# Patient Record
Sex: Female | Born: 1995 | Race: Black or African American | Hispanic: No | Marital: Married | Smoking: Never smoker
Health system: Southern US, Community
[De-identification: ages and names within clinical notes are randomized; demographics above are authoritative.]

## PROBLEM LIST (undated history)

## (undated) ENCOUNTER — Inpatient Hospital Stay (HOSPITAL_COMMUNITY): Payer: Self-pay

## (undated) DIAGNOSIS — I471 Supraventricular tachycardia, unspecified: Secondary | ICD-10-CM

## (undated) DIAGNOSIS — F419 Anxiety disorder, unspecified: Secondary | ICD-10-CM

## (undated) DIAGNOSIS — F329 Major depressive disorder, single episode, unspecified: Secondary | ICD-10-CM

## (undated) DIAGNOSIS — G43909 Migraine, unspecified, not intractable, without status migrainosus: Secondary | ICD-10-CM

## (undated) DIAGNOSIS — F32A Depression, unspecified: Secondary | ICD-10-CM

## (undated) HISTORY — PX: NO PAST SURGERIES: SHX2092

---
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
## 2017-04-01 DIAGNOSIS — R87629 Unspecified abnormal cytological findings in specimens from vagina: Secondary | ICD-10-CM

## 2017-04-01 HISTORY — DX: Unspecified abnormal cytological findings in specimens from vagina: R87.629

## 2017-11-23 ENCOUNTER — Emergency Department (HOSPITAL_COMMUNITY)
Admission: EM | Admit: 2017-11-23 | Discharge: 2017-11-23 | Disposition: A | Discharge status: Home / Self Care | Payer: 59 | Attending: Emergency Medicine | Admitting: Emergency Medicine

## 2017-11-23 ENCOUNTER — Encounter (HOSPITAL_COMMUNITY): Payer: Self-pay

## 2017-11-23 ENCOUNTER — Other Ambulatory Visit: Payer: Self-pay

## 2017-11-23 ENCOUNTER — Emergency Department (HOSPITAL_COMMUNITY): Payer: 59

## 2017-11-23 DIAGNOSIS — G43109 Migraine with aura, not intractable, without status migrainosus: Secondary | ICD-10-CM

## 2017-11-23 DIAGNOSIS — R110 Nausea: Secondary | ICD-10-CM | POA: Insufficient documentation

## 2017-11-23 DIAGNOSIS — R0789 Other chest pain: Secondary | ICD-10-CM | POA: Insufficient documentation

## 2017-11-23 DIAGNOSIS — G43809 Other migraine, not intractable, without status migrainosus: Secondary | ICD-10-CM | POA: Diagnosis not present

## 2017-11-23 DIAGNOSIS — R202 Paresthesia of skin: Secondary | ICD-10-CM

## 2017-11-23 HISTORY — DX: Migraine, unspecified, not intractable, without status migrainosus: G43.909

## 2017-11-23 LAB — COMPREHENSIVE METABOLIC PANEL
ALBUMIN: 3.8 g/dL (ref 3.5–5.0)
ALT: 11 U/L (ref 0–44)
ANION GAP: 10 (ref 5–15)
AST: 14 U/L — AB (ref 15–41)
Alkaline Phosphatase: 42 U/L (ref 38–126)
BUN: 10 mg/dL (ref 6–20)
CO2: 22 mmol/L (ref 22–32)
Calcium: 9.3 mg/dL (ref 8.9–10.3)
Chloride: 106 mmol/L (ref 98–111)
Creatinine, Ser: 0.82 mg/dL (ref 0.44–1.00)
GFR calc Af Amer: >60 mL/min (ref >60)
GFR calc non Af Amer: >60 mL/min (ref >60)
Glucose, Bld: 99 mg/dL (ref 70–99)
POTASSIUM: 3.8 mmol/L (ref 3.5–5.1)
SODIUM: 138 mmol/L (ref 135–145)
TOTAL PROTEIN: 6.8 g/dL (ref 6.5–8.1)
Total Bilirubin: 0.4 mg/dL (ref 0.3–1.2)

## 2017-11-23 LAB — I-STAT CHEM 8, ED
BUN: 12 mg/dL (ref 6–20)
CALCIUM ION: 1.16 mmol/L (ref 1.15–1.40)
CHLORIDE: 106 mmol/L (ref 98–111)
Creatinine, Ser: 0.7 mg/dL (ref 0.44–1.00)
GLUCOSE: 101 mg/dL — AB (ref 70–99)
HCT: 38 % (ref 36.0–46.0)
Hemoglobin: 12.9 g/dL (ref 12.0–15.0)
POTASSIUM: 3.8 mmol/L (ref 3.5–5.1)
Sodium: 138 mmol/L (ref 135–145)
TCO2: 22 mmol/L (ref 22–32)

## 2017-11-23 LAB — I-STAT BETA HCG BLOOD, ED (MC, WL, AP ONLY)

## 2017-11-23 LAB — RAPID URINE DRUG SCREEN, HOSP PERFORMED
AMPHETAMINES: NOT DETECTED
BARBITURATES: NOT DETECTED
BENZODIAZEPINES: NOT DETECTED
COCAINE: NOT DETECTED
OPIATES: NOT DETECTED
Tetrahydrocannabinol: NOT DETECTED

## 2017-11-23 LAB — DIFFERENTIAL
ABS IMMATURE GRANULOCYTES: 0 10*3/uL (ref 0.0–0.1)
BASOS ABS: 0 10*3/uL (ref 0.0–0.1)
Basophils Relative: 0 %
EOS ABS: 0.3 10*3/uL (ref 0.0–0.7)
EOS PCT: 3 %
IMMATURE GRANULOCYTES: 0 %
LYMPHS ABS: 2.4 10*3/uL (ref 0.7–4.0)
LYMPHS PCT: 24 %
Monocytes Absolute: 0.9 10*3/uL (ref 0.1–1.0)
Monocytes Relative: 9 %
Neutro Abs: 6.4 10*3/uL (ref 1.7–7.7)
Neutrophils Relative %: 64 %

## 2017-11-23 LAB — URINALYSIS, ROUTINE W REFLEX MICROSCOPIC
BILIRUBIN URINE: NEGATIVE
Glucose, UA: NEGATIVE mg/dL
Ketones, ur: NEGATIVE mg/dL
LEUKOCYTES UA: NEGATIVE
Nitrite: NEGATIVE
PH: 7 (ref 5.0–8.0)
Protein, ur: NEGATIVE mg/dL
SPECIFIC GRAVITY, URINE: 1.009 (ref 1.005–1.030)

## 2017-11-23 LAB — ETHANOL: Alcohol, Ethyl (B): <10 mg/dL (ref <10)

## 2017-11-23 LAB — I-STAT TROPONIN, ED: Troponin i, poc: 0.02 ng/mL (ref 0.00–0.08)

## 2017-11-23 LAB — CBC
HEMATOCRIT: 39.9 % (ref 36.0–46.0)
Hemoglobin: 13 g/dL (ref 12.0–15.0)
MCH: 30.4 pg (ref 26.0–34.0)
MCHC: 32.6 g/dL (ref 30.0–36.0)
MCV: 93.4 fL (ref 78.0–100.0)
PLATELETS: 290 10*3/uL (ref 150–400)
RBC: 4.27 MIL/uL (ref 3.87–5.11)
RDW: 12.3 % (ref 11.5–15.5)
WBC: 10.1 10*3/uL (ref 4.0–10.5)

## 2017-11-23 LAB — PROTIME-INR
INR: 0.89
Prothrombin Time: 12 seconds (ref 11.4–15.2)

## 2017-11-23 LAB — APTT: APTT: 25 s (ref 24–36)

## 2017-11-23 MED ORDER — METOCLOPRAMIDE HCL 5 MG/ML IJ SOLN
10.0000 mg | Freq: Once | INTRAMUSCULAR | Status: AC
  Administered 2017-11-23: 10 mg via INTRAVENOUS
  Filled 2017-11-23: qty 2

## 2017-11-23 MED ORDER — KETOROLAC TROMETHAMINE 30 MG/ML IJ SOLN
30.0000 mg | Freq: Once | INTRAMUSCULAR | Status: AC
  Administered 2017-11-23: 30 mg via INTRAVENOUS
  Filled 2017-11-23: qty 1

## 2017-11-23 MED ORDER — DIPHENHYDRAMINE HCL 50 MG/ML IJ SOLN
25.0000 mg | Freq: Once | INTRAMUSCULAR | Status: AC
  Administered 2017-11-23: 25 mg via INTRAVENOUS
  Filled 2017-11-23: qty 1

## 2017-11-23 NOTE — ED Notes (Signed)
Nurse starting IV and drawing labs. 

## 2017-11-23 NOTE — ED Triage Notes (Signed)
Patient c/o numbness on the right side that began about 01:00a.m. Also c/o HA

## 2017-11-23 NOTE — Discharge Instructions (Addendum)
Your MRI is normal.  There is no evidence of stroke.  Follow-up with a neurologist for further evaluation of your headaches.  Return to the ED if you develop new weakness, numbness, tingling or any other concerns.

## 2017-11-23 NOTE — ED Provider Notes (Signed)
[facility] EMERGENCY DEPARTMENT Provider Note   CSN: [id number] Arrival date & time: 11/23/17  0347     History   Chief Complaint Chief Complaint  Patient presents with  . Numbness    right side    HPI Lindsey Stevenson is a 22 y.o. female.  Patient presents with numbness and tingling to the right side of her body that onset around 1 AM.  She was awake at this time lying in bed.  She describes pins-and-needles sensation involving her right face, arm and leg.  Denies any weakness in her arm or leg.  No difficulty speaking or difficulty swallowing.  Also developed left-sided headache around the same time gradual in onset similar to previous "migraines".  She states she normally takes Excedrin.  Notes associated with photophobia and nausea but no vomiting.  No fever, chills, chest pain or shortness of breath.  She is never had a numbness before with her migraine headaches.  She is never had any brain imaging in our system.  Denies any illicit drug or alcohol use.  The history is provided by the patient.    Past Medical History:  Diagnosis Date  . Migraine     There are no active problems to display for this patient.   History reviewed. No pertinent surgical history.   OB History   None      Home Medications    Prior to Admission medications   Not on File    Family History History reviewed. No pertinent family history.  Social History Social History   Tobacco Use  . Smoking status: Never Smoker  . Smokeless tobacco: Never Used  Substance Use Topics  . Alcohol use: Yes  . Drug use: Never     Allergies   Patient has no known allergies.   Review of Systems Review of Systems  Constitutional: Negative for activity change, appetite change and fever.  HENT: Negative for congestion.   Eyes: Negative for photophobia and pain.  Respiratory: Positive for chest tightness. Negative for shortness of breath and stridor.   Cardiovascular: Negative  for chest pain.  Gastrointestinal: Positive for nausea. Negative for abdominal pain and vomiting.  Genitourinary: Negative for dysuria, frequency, hematuria, vaginal bleeding and vaginal discharge.  Musculoskeletal: Negative for arthralgias and myalgias.  Skin: Negative for rash.  Neurological: Positive for numbness and headaches. Negative for dizziness, seizures, facial asymmetry, speech difficulty and weakness.    all other systems are negative except as noted in the HPI and PMH.    Physical Exam Updated Vital Signs BP 138/85 (BP Location: Right Arm)   Pulse (!) 113   Temp 98.5 F (36.9 C) (Oral)   Resp 16   Wt 68 kg   LMP 11/09/2017 (Exact Date)   SpO2 100%   Physical Exam  Constitutional: She is oriented to person, place, and time. She appears well-developed and well-nourished. No distress.  HENT:  Head: Normocephalic and atraumatic.  Mouth/Throat: Oropharynx is clear and moist. No oropharyngeal exudate.  Eyes: Pupils are equal, round, and reactive to light. Conjunctivae and EOM are normal.  Neck: Normal range of motion. Neck supple.  No meningismus.  Cardiovascular: Normal rate, regular rhythm, normal heart sounds and intact distal pulses.  No murmur heard. Pulmonary/Chest: Effort normal and breath sounds normal. No respiratory distress. She exhibits no tenderness.  Abdominal: Soft. There is no tenderness. There is no rebound and no guarding.  Musculoskeletal: Normal range of motion. She exhibits no edema or tenderness.  Neurological: She  is alert and oriented to person, place, and time. No cranial nerve deficit. She exhibits normal muscle tone. Coordination normal.  CN 2-12 intact, no ataxia on finger to nose, no nystagmus, 5/5 strength throughout, no pronator drift, Romberg negative, normal gait.   Subjective decreased sensation to right face, arm and leg  Skin: Skin is warm. Capillary refill takes less than 2 seconds. No rash noted.  Psychiatric: She has a normal mood  and affect. Her behavior is normal.  Nursing note and vitals reviewed.    ED Treatments / Results  Labs (all labs ordered are listed, but only abnormal results are displayed) Labs Reviewed  COMPREHENSIVE METABOLIC PANEL - Abnormal; Notable for the following components:      Result Value   AST 14 (*)    All other components within normal limits  URINALYSIS, ROUTINE W REFLEX MICROSCOPIC - Abnormal; Notable for the following components:   Color, Urine STRAW (*)    Hgb urine dipstick MODERATE (*)    Bacteria, UA RARE (*)    All other components within normal limits  I-STAT CHEM 8, ED - Abnormal; Notable for the following components:   Glucose, Bld 101 (*)    All other components within normal limits  ETHANOL  PROTIME-INR  APTT  CBC  DIFFERENTIAL  RAPID URINE DRUG SCREEN, HOSP PERFORMED  I-STAT TROPONIN, ED  I-STAT BETA HCG BLOOD, ED ([facility], [facility], [facility] ONLY)    EKG EKG Interpretation  Date/Time:  Sunday November 23 2017 04:42:55 EDT Ventricular Rate:  114 PR Interval:    QRS Duration: 75 QT Interval:  318 QTC Calculation: 438 R Axis:   85 Text Interpretation:  Sinus tachycardia LAE, consider biatrial enlargement No previous ECGs available Confirmed by Ezequiel Essex [phone]) on 11/23/2017 4:50:17 AM Also confirmed by Ezequiel Essex [phone]), editor Abelardo Diesel [phone])  on 11/23/2017 7:30:27 AM   Radiology Mr Brain Wo Contrast  Result Date: 11/23/2017 CLINICAL DATA:  22 y/o F; numbness on the right-sided beginning at 1 a.m. Headache. EXAM: MRI HEAD WITHOUT CONTRAST TECHNIQUE: Multiplanar, multiecho pulse sequences of the brain and surrounding structures were obtained without intravenous contrast. COMPARISON:  None. FINDINGS: Brain: No acute infarction, hemorrhage, hydrocephalus, extra-axial collection or mass lesion. No structural or signal abnormality of the brain identified. Vascular: Normal flow voids. Skull and upper cervical spine: Normal marrow signal. Sinuses/Orbits: Partial  opacification of ethmoid air cells and mucosal thickening within the right sphenoid sinus. No abnormal signal of the mastoid air cells. Orbits are unremarkable. Other: None. IMPRESSION: 1. No acute intracranial abnormality identified. Unremarkable MRI of the brain. 2. Mild paranasal sinus disease. Electronically Signed   By: Kristine Garbe M.D.   On: 11/23/2017 06:11    Procedures Procedures (including critical care time)  Medications Ordered in ED Medications  metoCLOPramide (REGLAN) injection 10 mg (has no administration in time range)  ketorolac (TORADOL) 30 MG/ML injection 30 mg (has no administration in time range)  diphenhydrAMINE (BENADRYL) injection 25 mg (has no administration in time range)     Initial Impression / Assessment and Plan / ED Course  I have reviewed the triage vital signs and the nursing notes.  Pertinent labs & imaging results that were available during my care of the patient were reviewed by me and considered in my medical decision making (see chart for details).    Right-sided numbness associated with headache similar to previous migraines.  Patient has never had the numbness before with her migraines however.  Discussed with Dr. Leonel Ramsay of  neurology who agrees this is unlikely to be stroke and agrees with not calling code stroke.  Does recommend treating with migraine cocktail and obtaining MRI.  Patient given toradol, reglan, benadryl. Headache and numbness have improved.  MRI is reassuring.   Patient feels improved.  MRI shows no evidence of stroke.  She will follow-up with neurology.  Supportive care discussed for complicated migraine.  Neurology referral given.  Return precautions discussed.   Final Clinical Impressions(s) / ED Diagnoses   Final diagnoses:  Complicated migraine  Paresthesia    ED Discharge Orders    None       Malaki Koury, Annie Main, MD 11/23/17 [phone]

## 2017-11-23 NOTE — ED Notes (Addendum)
PT states understanding of care given, follow up care. Pt understand discharge paperwork and has no more questions at this time. PT ambulated from ED to car with a steady gait.

## 2018-12-21 LAB — OB RESULTS CONSOLE ABO/RH: RH Type: POSITIVE

## 2018-12-21 LAB — OB RESULTS CONSOLE RUBELLA ANTIBODY, IGM: Rubella: IMMUNE

## 2018-12-21 LAB — OB RESULTS CONSOLE HIV ANTIBODY (ROUTINE TESTING): HIV: NONREACTIVE

## 2018-12-21 LAB — OB RESULTS CONSOLE RPR: RPR: NONREACTIVE

## 2018-12-21 LAB — OB RESULTS CONSOLE HEPATITIS B SURFACE ANTIGEN: Hepatitis B Surface Ag: NEGATIVE

## 2019-03-07 ENCOUNTER — Inpatient Hospital Stay (HOSPITAL_BASED_OUTPATIENT_CLINIC_OR_DEPARTMENT_OTHER): Payer: BC Managed Care – PPO

## 2019-03-07 ENCOUNTER — Encounter (HOSPITAL_COMMUNITY): Payer: Self-pay

## 2019-03-07 ENCOUNTER — Inpatient Hospital Stay (HOSPITAL_COMMUNITY)
Admission: AD | Admit: 2019-03-07 | Discharge: 2019-03-07 | Disposition: A | Discharge status: Home / Self Care | Payer: BC Managed Care – PPO | Attending: Obstetrics and Gynecology | Admitting: Obstetrics and Gynecology

## 2019-03-07 ENCOUNTER — Other Ambulatory Visit: Payer: Self-pay

## 2019-03-07 DIAGNOSIS — O3412 Maternal care for benign tumor of corpus uteri, second trimester: Secondary | ICD-10-CM | POA: Diagnosis not present

## 2019-03-07 DIAGNOSIS — O4442 Low lying placenta NOS or without hemorrhage, second trimester: Secondary | ICD-10-CM | POA: Diagnosis not present

## 2019-03-07 DIAGNOSIS — R109 Unspecified abdominal pain: Secondary | ICD-10-CM | POA: Diagnosis present

## 2019-03-07 DIAGNOSIS — O4692 Antepartum hemorrhage, unspecified, second trimester: Secondary | ICD-10-CM

## 2019-03-07 DIAGNOSIS — O209 Hemorrhage in early pregnancy, unspecified: Secondary | ICD-10-CM | POA: Diagnosis not present

## 2019-03-07 DIAGNOSIS — Z3A18 18 weeks gestation of pregnancy: Secondary | ICD-10-CM | POA: Diagnosis not present

## 2019-03-07 DIAGNOSIS — D259 Leiomyoma of uterus, unspecified: Secondary | ICD-10-CM

## 2019-03-07 LAB — WET PREP, GENITAL
Clue Cells Wet Prep HPF POC: NONE SEEN
Sperm: NONE SEEN
Trich, Wet Prep: NONE SEEN
Yeast Wet Prep HPF POC: NONE SEEN

## 2019-03-07 LAB — URINALYSIS, ROUTINE W REFLEX MICROSCOPIC
Bilirubin Urine: NEGATIVE
Glucose, UA: NEGATIVE mg/dL
Hgb urine dipstick: NEGATIVE
Ketones, ur: 5 mg/dL — AB
Leukocytes,Ua: NEGATIVE
Nitrite: NEGATIVE
Protein, ur: NEGATIVE mg/dL
Specific Gravity, Urine: 1.008 (ref 1.005–1.030)
pH: 7 (ref 5.0–8.0)

## 2019-03-07 NOTE — MAU Note (Signed)
Pt c/o cramping that started aobut 30-45 mins prior to arrival and some vaginal bleeding that she noticed right before she came in. States there was a small amount of red blood in underwear. Reports she is [redacted] weeks pregnant. LMP: 10/30/18. States her EDD is 08/02/19. Gets care at [facility]

## 2019-03-07 NOTE — Discharge Instructions (Signed)
Placenta Previa Placenta previa is a condition in which the placenta implants in the lower part of the uterus in pregnant women. The placenta either partially or completely covers the opening to the cervix. This is a problem because the baby must pass through the cervix during delivery. There are three types of placenta previa:  Marginal placenta previa. The placenta reaches within an inch (2.5 cm) of the cervical opening but does not cover it.  Partial placenta previa. The placenta covers part of the cervical opening.  Complete placenta previa. The placenta covers the entire cervical opening. If the previa is marginal or partial and it is diagnosed in the first half of pregnancy, the placenta may move into a normal position as the pregnancy progresses and may no longer cover the cervix. It is important to keep all prenatal visits with your health care provider so you can be more closely monitored. What are the causes? The cause of this condition is not known. What increases the risk? This condition is more likely to develop in women who:  Are carrying more than one baby (multiples).  Have an abnormally shaped uterus.  Have scars on the lining of the uterus.  Have had surgeries involving the uterus, such as a cesarean delivery.  Have delivered a baby before.  Have a history of placenta previa.  Have smoked or used cocaine during pregnancy.  Are age 22 or older during pregnancy. What are the signs or symptoms? The main symptom of this condition is sudden, painless vaginal bleeding during the second half of pregnancy. The amount of bleeding can be very light at first, and it usually stops on its own. Heavier bleeding episodes may also happen. Some women with placenta previa may have no bleeding at all. How is this diagnosed?  This condition is diagnosed: ? From an ultrasound. This test uses sound waves to find where the placenta is located before you have any bleeding  episodes. ? During a checkup after vaginal bleeding is noticed.  If you are diagnosed with a partial or complete previa, digital exams with fingers will generally be avoided. Your health care provider will still perform a speculum exam.  If you did not have an ultrasound during your pregnancy, placenta previa may not be diagnosed until bleeding occurs during labor. How is this treated? Treatment for this condition may include:  Decreased activity.  Bed rest at home or in the hospital.  Pelvic rest. Nothing is placed inside the vagina during pelvic rest. This means not having sex and not using tampons or douches.  A blood transfusion to replace blood that you have lost (maternal blood loss).  A cesarean delivery. This may be performed if: ? The bleeding is heavy and cannot be controlled. ? The placenta completely covers the cervix.  Medicines to stop premature labor or to help the baby's lungs to mature. This treatment may be used if you need delivery before your pregnancy is full-term. Your treatment will be decided based on:  How much you are bleeding, or whether the bleeding has stopped.  How far along you are in your pregnancy.  The condition of your baby.  The type of placenta previa that you have.  Follow these instructions at home:  Get plenty of rest and lessen activity as told by your health care provider.  Stay on bed rest for as long as told by your health care provider.  Do not have sex, use tampons, use a douche, or place anything inside of  your vagina if your health care provider recommended pelvic rest.  Take over-the-counter and prescription medicines as told by your health care provider.  Keep all follow-up visits as told by your health care provider. This is important. Get help right away if:  You have vaginal bleeding, even if in small amounts and even if you have no pain.  You have cramping or regular contractions.  You have pain in your abdomen or  your lower back.  You have a feeling of increased pressure in your pelvis.  You have increased watery or bloody mucus from the vagina. This information is not intended to replace advice given to you by your health care provider. Make sure you discuss any questions you have with your health care provider. Document Released: 03/18/2005 Document Revised: 02/28/2017 Document Reviewed: 09/30/2015 Elsevier Patient Education  2020 [employer].

## 2019-03-07 NOTE — MAU Provider Note (Signed)
Chief Complaint: Abdominal Pain and Vaginal Bleeding   First Provider Initiated Contact with Patient 03/07/19 2104     SUBJECTIVE HPI: Lindsey Stevenson is a 23 y.o. G1P0 at [redacted]w[redacted]d who presents to Maternity Admissions reporting vaginal bleeding & abdominal cramping. Symptoms started about 45 minutes prior to arrival to MAU. Reports seeing a few spot of red blood in her underwear. Bleeding has not continued and she did not have to wear a pad. Had constant lower abdominal cramping that resolved prior to coming to MAU.  Denies n/v/d, dysuria, vaginal discharge, vaginal irritation, constipation, hemorrhoids, or recent intercourse. Goes to Dr. Nelda Marseille for prenatal care. Has not had her anatomy ultrasound yet. Is O positive per her EMR app on her phone.     Past Medical History:  Diagnosis Date  . Migraine    OB History  Gravida Para Term Preterm AB Living  1            SAB TAB Ectopic Multiple Live Births               # Outcome Date GA Lbr Len/2nd Weight Sex Delivery Anes PTL Lv  1 Current            Past Surgical History:  Procedure Laterality Date  . NO PAST SURGERIES     Social History   Socioeconomic History  . Marital status: Married    Spouse name: Not on file  . Number of children: Not on file  . Years of education: Not on file  . Highest education level: Not on file  Occupational History  . Not on file  Social Needs  . Financial resource strain: Not on file  . Food insecurity    Worry: Not on file    Inability: Not on file  . Transportation needs    Medical: Not on file    Non-medical: Not on file  Tobacco Use  . Smoking status: Never Smoker  . Smokeless tobacco: Never Used  Substance and Sexual Activity  . Alcohol use: Not Currently  . Drug use: Never  . Sexual activity: Yes  Lifestyle  . Physical activity    Days per week: Not on file    Minutes per session: Not on file  . Stress: Not on file  Relationships  . Social Herbalist on phone: Not on  file    Gets together: Not on file    Attends religious service: Not on file    Active member of club or organization: Not on file    Attends meetings of clubs or organizations: Not on file    Relationship status: Not on file  . Intimate partner violence    Fear of current or ex partner: Not on file    Emotionally abused: Not on file    Physically abused: Not on file    Forced sexual activity: Not on file  Other Topics Concern  . Not on file  Social History Narrative  . Not on file   History reviewed. No pertinent family history. No current facility-administered medications on file prior to encounter.    Current Outpatient Medications on File Prior to Encounter  Medication Sig Dispense Refill  . Prenatal Vit-Fe Fumarate-FA (PRENATAL MULTIVITAMIN) TABS tablet Take 1 tablet by mouth daily at 12 noon.     No Known Allergies  I have reviewed patient's Past Medical Hx, Surgical Hx, Family Hx, Social Hx, medications and allergies.   Review of Systems  Constitutional: Negative.  Gastrointestinal: Positive for abdominal pain (none currently). Negative for constipation, diarrhea, nausea and vomiting.  Genitourinary: Positive for vaginal bleeding. Negative for dysuria and vaginal discharge.    OBJECTIVE Patient Vitals for the past 24 hrs:  BP Temp Temp src Pulse Resp SpO2 Height Weight  03/07/19 2321 117/70 - - 78 18 - - -  03/07/19 2027 130/73 98.8 F (37.1 C) Oral 96 16 100 % 5\' 6"  (1.676 m) 73.4 kg   Constitutional: Well-developed, well-nourished female in no acute distress.  Cardiovascular: normal rate & rhythm, no murmur Respiratory: normal rate and effort. Lung sounds clear throughout GI: Abd soft, non-tender, Pos BS x 4. No guarding or rebound tenderness MS: Extremities nontender, no edema, normal ROM Neurologic: Alert and oriented x 4.  GU:     SPECULUM EXAM: NEFG, moderate amount of thin white discharge. Vaginal walls & cervix erythematous. Cervix visually closed. No  blood. Digital exam deferred.       LAB RESULTS Results for orders placed or performed during the hospital encounter of 03/07/19 (from the past 24 hour(s))  Urinalysis, Routine w reflex microscopic     Status: Abnormal   Collection Time: 03/07/19  8:42 PM  Result Value Ref Range   Color, Urine STRAW (A) YELLOW   APPearance CLEAR CLEAR   Specific Gravity, Urine 1.008 1.005 - 1.030   pH 7.0 5.0 - 8.0   Glucose, UA NEGATIVE NEGATIVE mg/dL   Hgb urine dipstick NEGATIVE NEGATIVE   Bilirubin Urine NEGATIVE NEGATIVE   Ketones, ur 5 (A) NEGATIVE mg/dL   Protein, ur NEGATIVE NEGATIVE mg/dL   Nitrite NEGATIVE NEGATIVE   Leukocytes,Ua NEGATIVE NEGATIVE  Wet prep, genital     Status: Abnormal   Collection Time: 03/07/19  9:14 PM  Result Value Ref Range   Yeast Wet Prep HPF POC NONE SEEN NONE SEEN   Trich, Wet Prep NONE SEEN NONE SEEN   Clue Cells Wet Prep HPF POC NONE SEEN NONE SEEN   WBC, Wet Prep HPF POC MANY (A) NONE SEEN   Sperm NONE SEEN     IMAGING [place] Mfm Ob Transvaginal  Result Date: 03/07/2019 ----------------------------------------------------------------------  OBSTETRICS REPORT                       (Signed Final 03/07/2019 10:43 pm) ---------------------------------------------------------------------- Patient Info  ID #:       [id number]                          D.O.B.:  [birth year] (23 yrs)  Name:       Lindsey Stevenson                 Visit Date: 03/07/2019 09:59 pm ---------------------------------------------------------------------- Performed By  Performed By:     Dorena Dew     Ref. Address:      [address]                    [place], [place]  Attending:        Tama High MD  Secondary Phy.:    MAU Nursing-                                                              MAU/Triage  Referred By:      Jorje Guild          [facility]                    [place] ---------------------------------------------------------------------- Orders   #  Description                          Code         Ordered By   1  [place] MFM OB LIMITED                    76815.01     Jorje Guild   2  [place] MFM OB TRANSVAGINAL               [id number]      Jorje Guild  ----------------------------------------------------------------------   #  Order #                    Accession #                 Episode #   1  [id number]                  [id number]   2  [id number]                  [id number]  ---------------------------------------------------------------------- Indications   Vaginal bleeding in pregnancy, second          O46.92   trimester   [redacted] weeks gestation of pregnancy                Z3A.18   Encounter for cervical length                  Z36.86   Encounter for antenatal screening for          Z36.3   malformations (Evaluate placenta location)   Uterine fibroids affecting pregnancy in        O34.12, D25.9   second trimester, antepartum  ---------------------------------------------------------------------- Fetal Evaluation  Num Of Fetuses:          1  Fetal Heart Rate(bpm):   148  Cardiac Activity:        Observed  Presentation:            Breech  Placenta:                Posterior Low-lying, 1.2cm from int os  P. Cord Insertion:       Visualized  Amniotic Fluid  AFI  FV:      Within normal limits                              Largest Pocket(cm)                              7.51 ---------------------------------------------------------------------- OB History  Gravidity:    1         Term:   0        Prem:   0        SAB:   0  TOP:          0       Ectopic:  0        Living: 0 ---------------------------------------------------------------------- Gestational Age  Clinical EDD:  18w 6d                                        EDD:   08/02/19  Best:          18w 6d     Det. By:  Clinical EDD             EDD:   08/02/19  ---------------------------------------------------------------------- Cervix Uterus Adnexa  Left Ovary  Within normal limits.  Right Ovary  Not visualized. ---------------------------------------------------------------------- Myomas   Site                     L(cm)      W(cm)      D(cm)       Location   Posterior Left           5.9        6.6        6.8   Anterior Left            3.2        3          2    Anterior Lt Fundal       3.4        1.8        3.8  ----------------------------------------------------------------------   Blood Flow                 RI        PI       Comments                                                 Largest  ---------------------------------------------------------------------- Impression  Patient is being evaluated for c/o vaginal bleeding and  abdominal cramping. On speculum examination, closed  cervix was seen.  A limited ultrasound study was performed. Amniotic fluid is  normal and good fetal activity is seen. 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 On transvaginal scan, the cervix  measures 6.2 cm, which may not be an accurate assessment  of cervical length (apparent lengthening by posterior myoma).  However, it is not consistent with cervical incompetence. The  placental edge is 1.2 cm from what appears as the internal os  (low lying). ---------------------------------------------------------------------- Recommendations  Detailed anatomical survey as outpatient. ----------------------------------------------------------------------  Tama High, MD Electronically Signed Final Report   03/07/2019 10:43 pm ----------------------------------------------------------------------  [place] Mfm Ob Limited  Result Date: 03/07/2019 ----------------------------------------------------------------------  OBSTETRICS REPORT                       (Signed Final 03/07/2019 10:43 pm)  ---------------------------------------------------------------------- Patient Info  ID #:       [id number]                          D.O.B.:  1996/03/25 (23 yrs)  Name:       Lindsey Stevenson                 Visit Date: 03/07/2019 09:59 pm ---------------------------------------------------------------------- Performed By  Performed By:     Dorena Dew     Ref. Address:      [address]                    [place], [place]  Attending:        Tama High MD        Secondary Phy.:    MAU Nursing-                                                              MAU/Triage  Referred By:      Jorje Guild          [facility]                    [place] ---------------------------------------------------------------------- Orders   #  Description                          Code         Ordered By   1  [place] MFM OB LIMITED                    76815.01     Jorje Guild   2  [place] MFM OB TRANSVAGINAL               [id number]      Jorje Guild  ----------------------------------------------------------------------   #  Order #                    Accession #                 Episode #   1  [id number]                  [id number]  [id number]   2  [id number]                  [id number]                  [id number]  ---------------------------------------------------------------------- Indications   Vaginal bleeding in pregnancy, second          O46.92   trimester   [redacted] weeks gestation of pregnancy                Z3A.18   Encounter for cervical length                  Z36.86   Encounter for antenatal screening for          Z36.3   malformations (Evaluate placenta location)   Uterine fibroids affecting pregnancy in        O34.12, D25.9   second trimester, antepartum  ---------------------------------------------------------------------- Fetal Evaluation  Num Of Fetuses:          1  Fetal Heart  Rate(bpm):   148  Cardiac Activity:        Observed  Presentation:            Breech  Placenta:                Posterior Low-lying, 1.2cm from int os  P. Cord Insertion:       Visualized  Amniotic Fluid  AFI FV:      Within normal limits                              Largest Pocket(cm)                              7.51 ---------------------------------------------------------------------- OB History  Gravidity:    1         Term:   0        Prem:   0        SAB:   0  TOP:          0       Ectopic:  0        Living: 0 ---------------------------------------------------------------------- Gestational Age  Clinical EDD:  18w 6d                                        EDD:   08/02/19  Best:          18w 6d     Det. By:  Clinical EDD             EDD:   08/02/19 ---------------------------------------------------------------------- Cervix Uterus Adnexa  Left Ovary  Within normal limits.  Right Ovary  Not visualized. ---------------------------------------------------------------------- Myomas   Site                     L(cm)      W(cm)      D(cm)       Location   Posterior Left           5.9        6.6        6.8   Anterior Left            3.2        3  2   Anterior Lt Fundal       3.4        1.8        3.8  ----------------------------------------------------------------------   Blood Flow                 RI        PI       Comments                                                 Largest  ---------------------------------------------------------------------- Impression  Patient is being evaluated for c/o vaginal bleeding and  abdominal cramping. On speculum examination, closed  cervix was seen.  A limited ultrasound study was performed. Amniotic fluid is  normal and good fetal activity is seen. 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 On transvaginal scan, the cervix  measures 6.2 cm, which may not be an accurate assessment  of cervical length (apparent  lengthening by posterior myoma).  However, it is not consistent with cervical incompetence. The  placental edge is 1.2 cm from what appears as the internal os  (low lying). ---------------------------------------------------------------------- Recommendations  Detailed anatomical survey as outpatient. ----------------------------------------------------------------------                  Tama High, MD Electronically Signed Final Report   03/07/2019 10:43 pm ----------------------------------------------------------------------   MAU COURSE Orders Placed This Encounter  Procedures  . Wet prep, genital  . [place] MFM OB LIMITED  . [place] MFM OB Transvaginal  . Urinalysis, Routine w reflex microscopic  . Discharge patient   No orders of the defined types were placed in this encounter.   MDM RH positive  FHT present via doppler  No blood on exam & cervix visually closed  Ultrasound - normal cervical length. Low lying placenta, 1.2 cm from os  Reviewed results with patient & discussed pelvic rest. Has anatomy ultrasound & ob appt in a few weeks.  ASSESSMENT 1. Low-lying placenta in second trimester   2. [redacted] weeks gestation of pregnancy   3. Vaginal bleeding in pregnancy, second trimester     PLAN Discharge home in stable condition. Bleeding precautions GC/CT pending Pelvic rest  Follow-up Information    Janyth Pupa, DO Follow up.   Specialty: Obstetrics and Gynecology Why: keep scheduled appointment or call office as needed Contact information: [place]. [place] [address] [phone]        [facility] Follow up.   Specialty: Obstetrics and Gynecology Why: return for worsening symptoms Contact information: [address] [id number] [place] [phone]         Allergies as of 03/07/2019   No Known Allergies     Medication List    TAKE these medications   prenatal multivitamin  Tabs tablet Take 1 tablet by mouth daily at 12 noon.        Jorje Guild, NP 03/07/2019  11:32 PM

## 2019-03-09 LAB — GC/CHLAMYDIA PROBE AMP (~~LOC~~) NOT AT ARMC
Chlamydia: NEGATIVE
Comment: NEGATIVE
Comment: NORMAL
Neisseria Gonorrhea: NEGATIVE

## 2019-04-02 NOTE — L&D Delivery Note (Signed)
Delivery Note At 6:30 PM a viable female was delivered via Vaginal, Spontaneous (Presentation: Left Occiput Anterior).  APGAR: 9, 9; weight  .   Placenta status: Spontaneous, Intact.  Cord: 3 vessels with the following complications: None.  Cord pH: NA  Anesthesia: Epidural Episiotomy: None Lacerations: 1st degree;Labial Suture Repair: 3.0 vicryl Est. Blood Loss (mL): 300 mL   Mom to postpartum.  Baby to Couplet care / Skin to Skin.  Lindsey Stevenson 08/07/2019, 6:56 PM

## 2019-07-09 LAB — OB RESULTS CONSOLE GBS: GBS: NEGATIVE

## 2019-07-29 ENCOUNTER — Inpatient Hospital Stay (HOSPITAL_COMMUNITY)
Admission: AD | Admit: 2019-07-29 | Discharge: 2019-07-29 | Disposition: A | Discharge status: Home / Self Care | Payer: No Typology Code available for payment source | Attending: Obstetrics & Gynecology | Admitting: Obstetrics & Gynecology

## 2019-07-29 ENCOUNTER — Other Ambulatory Visit: Payer: Self-pay

## 2019-07-29 ENCOUNTER — Encounter (HOSPITAL_COMMUNITY): Payer: Self-pay | Admitting: Obstetrics & Gynecology

## 2019-07-29 DIAGNOSIS — O26893 Other specified pregnancy related conditions, third trimester: Secondary | ICD-10-CM | POA: Insufficient documentation

## 2019-07-29 DIAGNOSIS — R030 Elevated blood-pressure reading, without diagnosis of hypertension: Secondary | ICD-10-CM | POA: Diagnosis present

## 2019-07-29 DIAGNOSIS — R519 Headache, unspecified: Secondary | ICD-10-CM | POA: Diagnosis not present

## 2019-07-29 DIAGNOSIS — Z3689 Encounter for other specified antenatal screening: Secondary | ICD-10-CM

## 2019-07-29 DIAGNOSIS — Z3A39 39 weeks gestation of pregnancy: Secondary | ICD-10-CM | POA: Insufficient documentation

## 2019-07-29 LAB — COMPREHENSIVE METABOLIC PANEL
ALT: 15 U/L (ref 0–44)
AST: 16 U/L (ref 15–41)
Albumin: 2.6 g/dL — ABNORMAL LOW (ref 3.5–5.0)
Alkaline Phosphatase: 89 U/L (ref 38–126)
Anion gap: 9 (ref 5–15)
BUN: 6 mg/dL (ref 6–20)
CO2: 23 mmol/L (ref 22–32)
Calcium: 9.1 mg/dL (ref 8.9–10.3)
Chloride: 107 mmol/L (ref 98–111)
Creatinine, Ser: 0.89 mg/dL (ref 0.44–1.00)
GFR calc Af Amer: >60 mL/min (ref >60)
GFR calc non Af Amer: >60 mL/min (ref >60)
Glucose, Bld: 89 mg/dL (ref 70–99)
Potassium: 4.1 mmol/L (ref 3.5–5.1)
Sodium: 139 mmol/L (ref 135–145)
Total Bilirubin: 0.4 mg/dL (ref 0.3–1.2)
Total Protein: 5.7 g/dL — ABNORMAL LOW (ref 6.5–8.1)

## 2019-07-29 LAB — CBC
HCT: 34.2 % — ABNORMAL LOW (ref 36.0–46.0)
Hemoglobin: 11.6 g/dL — ABNORMAL LOW (ref 12.0–15.0)
MCH: 31.4 pg (ref 26.0–34.0)
MCHC: 33.9 g/dL (ref 30.0–36.0)
MCV: 92.7 fL (ref 80.0–100.0)
Platelets: 221 10*3/uL (ref 150–400)
RBC: 3.69 MIL/uL — ABNORMAL LOW (ref 3.87–5.11)
RDW: 13.2 % (ref 11.5–15.5)
WBC: 10 10*3/uL (ref 4.0–10.5)
nRBC: 0 % (ref 0.0–0.2)

## 2019-07-29 LAB — PROTEIN / CREATININE RATIO, URINE
Creatinine, Urine: 42.19 mg/dL
Total Protein, Urine: <6 mg/dL

## 2019-07-29 NOTE — MAU Provider Note (Signed)
History     CSN: [id number]  Arrival date and time: 07/29/19 1504   First Provider Initiated Contact with Patient 07/29/19 1607      Chief Complaint  Patient presents with  . Headache  . Hypertension   [name] is a 24 y.o. G1P0 at [redacted]w[redacted]d who presents to MAU for preeclampsia evaluation after she went to a pharmacy today to check her blood pressure and had a severe range reading. Patient reports her mother does have a blood pressure cuff at home, but reports her mother's doctor told her not to use it because it was not accurate. Patient reports she got a severe headache at home, took 2 regular strength Tylenol and then went to the pharmacy to check her blood pressure. Patient reports a history of diagnosed migraine headaches with blurry vision, which is what she experienced today. Patient reports headache is 1/10 on arrival to MAU, without blurry vision.  Pt denies HA, blurry vision/seeing spots, N/V, epigastric pain, swelling in face and hands, sudden weight gain. Pt denies chest pain and SOB.  Pt denies constipation, diarrhea, or urinary problems. Pt denies fever, chills, fatigue, sweating or changes in appetite. Pt denies dizziness, light-headedness, weakness.  Pt denies VB, ctx, LOF and reports good FM.   OB History    Gravida  1   Para      Term      Preterm      AB      Living        SAB      TAB      Ectopic      Multiple      Live Births              Past Medical History:  Diagnosis Date  . Migraine     Past Surgical History:  Procedure Laterality Date  . NO PAST SURGERIES      Family History  Problem Relation Age of Onset  . Hypertension Maternal Grandmother   . Cancer Maternal Grandmother   . Hypertension Maternal Grandfather   . Hypertension Paternal Grandmother   . Hypertension Paternal Grandfather     Social History   Tobacco Use  . Smoking status: Never Smoker  . Smokeless tobacco: Never Used  Substance Use Topics   . Alcohol use: Not Currently  . Drug use: Never    Allergies: No Known Allergies  No medications prior to admission.    Review of Systems  Constitutional: Negative for chills, diaphoresis, fatigue and fever.  Eyes: Negative for visual disturbance.  Respiratory: Negative for shortness of breath.   Cardiovascular: Negative for chest pain.  Gastrointestinal: Negative for abdominal pain, constipation, diarrhea, nausea and vomiting.  Genitourinary: Negative for dysuria, flank pain, frequency, pelvic pain, urgency, vaginal bleeding and vaginal discharge.  Neurological: Positive for headaches. Negative for dizziness, weakness and light-headedness.   Physical Exam   Blood pressure 122/71, pulse 87, temperature 98.5 F (36.9 C), temperature source Oral, resp. rate 16, height 5\' 6"  (1.676 m), weight 96.3 kg, SpO2 97 %.  Patient Vitals for the past 24 hrs:  BP Temp Temp src Pulse Resp SpO2 Height Weight  07/29/19 1704 -- -- -- -- 16 -- -- --  07/29/19 1631 122/71 -- -- 87 -- -- -- --  07/29/19 1624 137/80 -- -- 86 -- -- -- --  07/29/19 1600 118/74 -- -- 88 -- 97 % -- --  07/29/19 1537 131/81 -- -- 98 -- -- -- --  07/29/19  1524 131/77 98.5 F (36.9 C) Oral 81 16 100 % 5\' 6"  (1.676 m) 96.3 kg   Physical Exam  Constitutional: She is oriented to person, place, and time. She appears well-developed and well-nourished. No distress.  HENT:  Head: Normocephalic and atraumatic.  Respiratory: Effort normal.  Neurological: She is alert and oriented to person, place, and time.  Skin: She is not diaphoretic.  Psychiatric: She has a normal mood and affect. Her behavior is normal. Judgment and thought content normal.   Results for orders placed or performed during the hospital encounter of 07/29/19 (from the past 24 hour(s))  Protein / creatinine ratio, urine     Status: None   Collection Time: 07/29/19  4:32 PM  Result Value Ref Range   Creatinine, Urine 42.19 mg/dL   Total Protein, Urine <6  mg/dL   Protein Creatinine Ratio        0.00 - 0.15 mg/mg[Cre]  CBC     Status: Abnormal   Collection Time: 07/29/19  4:37 PM  Result Value Ref Range   WBC 10.0 4.0 - 10.5 K/uL   RBC 3.69 (L) 3.87 - 5.11 MIL/uL   Hemoglobin 11.6 (L) 12.0 - 15.0 g/dL   HCT 34.2 (L) 36.0 - 46.0 %   MCV 92.7 80.0 - 100.0 fL   MCH 31.4 26.0 - 34.0 pg   MCHC 33.9 30.0 - 36.0 g/dL   RDW 13.2 11.5 - 15.5 %   Platelets 221 150 - 400 K/uL   nRBC 0.0 0.0 - 0.2 %  Comprehensive metabolic panel     Status: Abnormal   Collection Time: 07/29/19  4:37 PM  Result Value Ref Range   Sodium 139 135 - 145 mmol/L   Potassium 4.1 3.5 - 5.1 mmol/L   Chloride 107 98 - 111 mmol/L   CO2 23 22 - 32 mmol/L   Glucose, Bld 89 70 - 99 mg/dL   BUN 6 6 - 20 mg/dL   Creatinine, Ser 0.89 0.44 - 1.00 mg/dL   Calcium 9.1 8.9 - 10.3 mg/dL   Total Protein 5.7 (L) 6.5 - 8.1 g/dL   Albumin 2.6 (L) 3.5 - 5.0 g/dL   AST 16 15 - 41 U/L   ALT 15 0 - 44 U/L   Alkaline Phosphatase 89 38 - 126 U/L   Total Bilirubin 0.4 0.3 - 1.2 mg/dL   GFR calc non Af Amer >60 >60 mL/min   GFR calc Af Amer >60 >60 mL/min   Anion gap 9 5 - 15    MAU Course  Procedures  MDM -preeclampsia evaluation with normal BP in MAU on admission -symptoms include: HA; patient already took Tylenol at home -CBC: H/H 11.6/34.2, platelets 221 -CMP: serum creatinine 0.89, AST/ALT 16/15 -PCr: too low to calculate -EFM: reactive       -baseline: 150       -variability: moderate       -accels: present, 15x15       -decels: absent       -TOCO: few, irregular ctx, pt reports not feeling -pt discharged to home in stable condition  Orders Placed This Encounter  Procedures  . CBC    Standing Status:   Standing    Number of Occurrences:   1  . Protein / creatinine ratio, urine    Standing Status:   Standing    Number of Occurrences:   1  . Comprehensive metabolic panel    Standing Status:   Standing    Number of  Occurrences:   1  . Discharge patient    Order  Specific Question:   Discharge disposition    Answer:   01-Home or Self Care [1]    Order Specific Question:   Discharge patient date    Answer:   07/29/2019    Assessment and Plan   1. Pregnancy headache in third trimester   2. [redacted] weeks gestation of pregnancy   3. NST (non-stress test) reactive     Allergies as of 07/29/2019   No Known Allergies     Medication List    TAKE these medications   prenatal multivitamin Tabs tablet Take 1 tablet by mouth daily at 12 noon.      -called and spoke with Dr. Nelda Marseille who requests labs to be done despite normal BP; advised that patient should have BP check in office, Dr. Nelda Marseille agrees with plan and will have the office call the patient tomorrow for a BP check -patient advised will call with abnormal results for return to hospital -patient advised of BP check tomorrow -discussed s/sx of preeclampsia -discussed s/sx of labor -return MAU precautions given -pt discharged to home in stable condition  Elmyra Ricks E Shagun Wordell 07/29/2019, 6:00 PM

## 2019-07-29 NOTE — MAU Note (Signed)
Had a HA earlier (better after Tylenol), went to pharmacy and checked BP was 160/92. Some blurring on left (better now), denies epigastric pain or swelling.

## 2019-07-29 NOTE — Discharge Instructions (Signed)
Signs and Symptoms of Labor Labor is your body's natural process of moving your baby, placenta, and umbilical cord out of your uterus. The process of labor usually starts when your baby is full-term, between 37 and 40 weeks of pregnancy. How will I know when I am close to going into labor? As your body prepares for labor and the birth of your baby, you may notice the following symptoms in the weeks and days before true labor starts:  Having a strong desire to get your home ready to receive your new baby. This is called nesting. Nesting may be a sign that labor is approaching, and it may occur several weeks before birth. Nesting may involve cleaning and organizing your home.  Passing a small amount of thick, bloody mucus out of your vagina (normal bloody show or losing your mucus plug). This may happen more than a week before labor begins, or it might occur right before labor begins as the opening of the cervix starts to widen (dilate). For some women, the entire mucus plug passes at once. For others, smaller portions of the mucus plug may gradually pass over several days.  Your baby moving (dropping) lower in your pelvis to get into position for birth (lightening). When this happens, you may feel more pressure on your bladder and pelvic bone and less pressure on your ribs. This may make it easier to breathe. It may also cause you to need to urinate more often and have problems with bowel movements.  Having "practice contractions" (Braxton Hicks contractions) that occur at irregular (unevenly spaced) intervals that are more than 10 minutes apart. This is also called false labor. False labor contractions are common after exercise or sexual activity, and they will stop if you change position, rest, or drink fluids. These contractions are usually mild and do not get stronger over time. They may feel like: ? A backache or back pain. ? Mild cramps, similar to menstrual cramps. ? Tightening or pressure in  your abdomen. Other early symptoms that labor may be starting soon include:  Nausea or loss of appetite.  Diarrhea.  Having a sudden burst of energy, or feeling very tired.  Mood changes.  Having trouble sleeping. How will I know when labor has begun? Signs that true labor has begun may include:  Having contractions that come at regular (evenly spaced) intervals and increase in intensity. This may feel like more intense tightening or pressure in your abdomen that moves to your back. ? Contractions may also feel like rhythmic pain in your upper thighs or back that comes and goes at regular intervals. ? For first-time mothers, this change in intensity of contractions often occurs at a more gradual pace. ? Women who have given birth before may notice a more rapid progression of contraction changes.  Having a feeling of pressure in the vaginal area.  Your water breaking (rupture of membranes). This is when the sac of fluid that surrounds your baby breaks. When this happens, you will notice fluid leaking from your vagina. This may be clear or blood-tinged. Labor usually starts within 24 hours of your water breaking, but it may take longer to begin. ? Some women notice this as a gush of fluid. ? Others notice that their underwear repeatedly becomes damp. Follow these instructions at home:   When labor starts, or if your water breaks, call your health care provider or nurse care line. Based on your situation, they will determine when you should go in for an   exam.  When you are in early labor, you may be able to rest and manage symptoms at home. Some strategies to try at home include: ? Breathing and relaxation techniques. ? Taking a warm bath or shower. ? Listening to music. ? Using a heating pad on the lower back for pain. If you are directed to use heat:  Place a towel between your skin and the heat source.  Leave the heat on for 20-30 minutes.  Remove the heat if your skin turns  bright red. This is especially important if you are unable to feel pain, heat, or cold. You may have a greater risk of getting burned. Get help right away if:  You have painful, regular contractions that are 5 minutes apart or less.  Labor starts before you are [redacted] weeks along in your pregnancy.  You have a fever.  You have a headache that does not go away.  You have bright red blood coming from your vagina.  You do not feel your baby moving.  You have a sudden onset of: ? Severe headache with vision problems. ? Nausea, vomiting, or diarrhea. ? Chest pain or shortness of breath. These symptoms may be an emergency. If your health care provider recommends that you go to the hospital or birth center where you plan to deliver, do not drive yourself. Have someone else drive you, or call emergency services (911 in the [place] Summary  Labor is your body's natural process of moving your baby, placenta, and umbilical cord out of your uterus.  The process of labor usually starts when your baby is full-term, between 56 and 40 weeks of pregnancy.  When labor starts, or if your water breaks, call your health care provider or nurse care line. Based on your situation, they will determine when you should go in for an exam. This information is not intended to replace advice given to you by your health care provider. Make sure you discuss any questions you have with your health care provider. Document Revised: 12/16/2016 Document Reviewed: 08/23/2016 Elsevier Patient Education  [employer].      Preeclampsia and Eclampsia Preeclampsia is a serious condition that may develop during pregnancy. This condition causes high blood pressure and increased protein in your urine along with other symptoms, such as headaches and vision changes. These symptoms may develop as the condition gets worse. Preeclampsia may occur at 20 weeks of pregnancy or later. Diagnosing and treating preeclampsia early is  very important. If not treated early, it can cause serious problems for you and your baby. One problem it can lead to is eclampsia. Eclampsia is a condition that causes muscle jerking or shaking (convulsions or seizures) and other serious problems for the mother. During pregnancy, delivering your baby may be the best treatment for preeclampsia or eclampsia. For most women, preeclampsia and eclampsia symptoms go away after giving birth. In rare cases, a woman may develop preeclampsia after giving birth (postpartum preeclampsia). This usually occurs within 48 hours after childbirth but may occur up to 6 weeks after giving birth. What are the causes? The cause of preeclampsia is not known. What increases the risk? The following risk factors make you more likely to develop preeclampsia:  Being pregnant for the first time.  Having had preeclampsia during a past pregnancy.  Having a family history of preeclampsia.  Having high blood pressure.  Being pregnant with more than one baby.  Being 67 or older.  Being African-American.  Having kidney disease or diabetes.  Having medical conditions such as lupus or blood diseases.  Being very overweight (obese). What are the signs or symptoms? The most common symptoms are:  Severe headaches.  Vision problems, such as blurred or double vision.  Abdominal pain, especially upper abdominal pain. Other symptoms that may develop as the condition gets worse include:  Sudden weight gain.  Sudden swelling of the hands, face, legs, and feet.  Severe nausea and vomiting.  Numbness in the face, arms, legs, and feet.  Dizziness.  Urinating less than usual.  Slurred speech.  Convulsions or seizures. How is this diagnosed? There are no screening tests for preeclampsia. Your health care provider will ask you about symptoms and check for signs of preeclampsia during your prenatal visits. You may also have tests that include:  Checking your blood  pressure.  Urine tests to check for protein. Your health care provider will check for this at every prenatal visit.  Blood tests.  Monitoring your baby's heart rate.  Ultrasound. How is this treated? You and your health care provider will determine the treatment approach that is best for you. Treatment may include:  Having more frequent prenatal exams to check for signs of preeclampsia, if you have an increased risk for preeclampsia.  Medicine to lower your blood pressure.  Staying in the hospital, if your condition is severe. There, treatment will focus on controlling your blood pressure and the amount of fluids in your body (fluid retention).  Taking medicine (magnesium sulfate) to prevent seizures. This may be given as an injection or through an IV.  Taking a low-dose aspirin during your pregnancy.  Delivering your baby early. You may have your labor started with medicine (induced), or you may have a cesarean delivery. Follow these instructions at home: Eating and drinking   Drink enough fluid to keep your urine pale yellow.  Avoid caffeine. Lifestyle  Do not use any products that contain nicotine or tobacco, such as cigarettes and e-cigarettes. If you need help quitting, ask your health care provider.  Do not use alcohol or drugs.  Avoid stress as much as possible. Rest and get plenty of sleep. General instructions  Take over-the-counter and prescription medicines only as told by your health care provider.  When lying down, lie on your left side. This keeps pressure off your major blood vessels.  When sitting or lying down, raise (elevate) your feet. Try putting some pillows underneath your lower legs.  Exercise regularly. Ask your health care provider what kinds of exercise are best for you.  Keep all follow-up and prenatal visits as told by your health care provider. This is important. How is this prevented? There is no known way of preventing preeclampsia or  eclampsia from developing. However, to lower your risk of complications and detect problems early:  Get regular prenatal care. Your health care provider may be able to diagnose and treat the condition early.  Maintain a healthy weight. Ask your health care provider for help managing weight gain during pregnancy.  Work with your health care provider to manage any long-term (chronic) health conditions you have, such as diabetes or kidney problems.  You may have tests of your blood pressure and kidney function after giving birth.  Your health care provider may have you take low-dose aspirin during your next pregnancy. Contact a health care provider if:  You have symptoms that your health care provider told you may require more treatment or monitoring, such as: ? Headaches. ? Nausea or vomiting. ? Abdominal pain. ?  Dizziness. ? Light-headedness. Get help right away if:  You have severe: ? Abdominal pain. ? Headaches that do not get better. ? Dizziness. ? Vision problems. ? Confusion. ? Nausea or vomiting.  You have any of the following: ? A seizure. ? Sudden, rapid weight gain. ? Sudden swelling in your hands, ankles, or face. ? Trouble moving any part of your body. ? Numbness in any part of your body. ? Trouble speaking. ? Abnormal bleeding.  You faint. Summary  Preeclampsia is a serious condition that may develop during pregnancy.  This condition causes high blood pressure and increased protein in your urine along with other symptoms, such as headaches and vision changes.  Diagnosing and treating preeclampsia early is very important. If not treated early, it can cause serious problems for you and your baby.  Get help right away if you have symptoms that your health care provider told you to watch for. This information is not intended to replace advice given to you by your health care provider. Make sure you discuss any questions you have with your health care  provider. Document Revised: 11/18/2017 Document Reviewed: 10/23/2015 Elsevier Patient Education  [place].

## 2019-08-03 ENCOUNTER — Telehealth (HOSPITAL_COMMUNITY): Payer: Self-pay | Admitting: *Deleted

## 2019-08-03 ENCOUNTER — Encounter (HOSPITAL_COMMUNITY): Payer: Self-pay | Admitting: *Deleted

## 2019-08-03 NOTE — Telephone Encounter (Signed)
Preadmission screen  

## 2019-08-06 ENCOUNTER — Inpatient Hospital Stay (HOSPITAL_COMMUNITY)
Admission: AD | Admit: 2019-08-06 | Discharge: 2019-08-07 | Disposition: A | Discharge status: Home / Self Care | Payer: No Typology Code available for payment source | Source: Home / Self Care | Attending: Obstetrics & Gynecology | Admitting: Obstetrics & Gynecology

## 2019-08-06 ENCOUNTER — Encounter (HOSPITAL_COMMUNITY): Payer: Self-pay | Admitting: Obstetrics & Gynecology

## 2019-08-06 DIAGNOSIS — Z3689 Encounter for other specified antenatal screening: Secondary | ICD-10-CM

## 2019-08-06 DIAGNOSIS — O479 False labor, unspecified: Secondary | ICD-10-CM

## 2019-08-06 NOTE — MAU Note (Signed)
Pt reports to MAU c/o ctx every 3-6 min for the last 3 hours. Pt reports the pain is a 1/10. No bleeding or LOF. +FM.

## 2019-08-07 ENCOUNTER — Inpatient Hospital Stay (HOSPITAL_COMMUNITY)
Admission: AD | Admit: 2019-08-07 | Discharge: 2019-08-09 | DRG: 807 | Disposition: A | Discharge status: Home / Self Care | Payer: No Typology Code available for payment source | Attending: Obstetrics and Gynecology | Admitting: Obstetrics and Gynecology

## 2019-08-07 ENCOUNTER — Other Ambulatory Visit (HOSPITAL_COMMUNITY): Admission: RE | Admit: 2019-08-07 | Payer: No Typology Code available for payment source | Source: Ambulatory Visit

## 2019-08-07 ENCOUNTER — Other Ambulatory Visit: Payer: Self-pay

## 2019-08-07 ENCOUNTER — Inpatient Hospital Stay (HOSPITAL_COMMUNITY): Payer: No Typology Code available for payment source | Admitting: Anesthesiology

## 2019-08-07 ENCOUNTER — Encounter (HOSPITAL_COMMUNITY): Payer: Self-pay | Admitting: Obstetrics and Gynecology

## 2019-08-07 DIAGNOSIS — O26893 Other specified pregnancy related conditions, third trimester: Secondary | ICD-10-CM | POA: Diagnosis present

## 2019-08-07 DIAGNOSIS — D259 Leiomyoma of uterus, unspecified: Secondary | ICD-10-CM | POA: Diagnosis present

## 2019-08-07 DIAGNOSIS — O3413 Maternal care for benign tumor of corpus uteri, third trimester: Secondary | ICD-10-CM | POA: Diagnosis present

## 2019-08-07 DIAGNOSIS — Z3A40 40 weeks gestation of pregnancy: Secondary | ICD-10-CM

## 2019-08-07 DIAGNOSIS — O4292 Full-term premature rupture of membranes, unspecified as to length of time between rupture and onset of labor: Principal | ICD-10-CM | POA: Diagnosis present

## 2019-08-07 DIAGNOSIS — Z20822 Contact with and (suspected) exposure to covid-19: Secondary | ICD-10-CM | POA: Diagnosis present

## 2019-08-07 HISTORY — DX: Anxiety disorder, unspecified: F41.9

## 2019-08-07 HISTORY — DX: Depression, unspecified: F32.A

## 2019-08-07 LAB — CBC
HCT: 36.5 % (ref 36.0–46.0)
Hemoglobin: 12.1 g/dL (ref 12.0–15.0)
MCH: 30.9 pg (ref 26.0–34.0)
MCHC: 33.2 g/dL (ref 30.0–36.0)
MCV: 93.1 fL (ref 80.0–100.0)
Platelets: 240 10*3/uL (ref 150–400)
RBC: 3.92 MIL/uL (ref 3.87–5.11)
RDW: 13.3 % (ref 11.5–15.5)
WBC: 12.6 10*3/uL — ABNORMAL HIGH (ref 4.0–10.5)
nRBC: 0.2 % (ref 0.0–0.2)

## 2019-08-07 LAB — RPR: RPR Ser Ql: NONREACTIVE

## 2019-08-07 LAB — RESPIRATORY PANEL BY RT PCR (FLU A&B, COVID)
Influenza A by PCR: NEGATIVE
Influenza B by PCR: NEGATIVE
SARS Coronavirus 2 by RT PCR: NEGATIVE

## 2019-08-07 LAB — TYPE AND SCREEN
ABO/RH(D): O POS
Antibody Screen: NEGATIVE

## 2019-08-07 LAB — ABO/RH: ABO/RH(D): O POS

## 2019-08-07 MED ORDER — SODIUM CHLORIDE (PF) 0.9 % IJ SOLN
INTRAMUSCULAR | Status: DC | PRN
  Administered 2019-08-07: 12 mL/h via EPIDURAL

## 2019-08-07 MED ORDER — TERBUTALINE SULFATE 1 MG/ML IJ SOLN
0.2500 mg | Freq: Once | INTRAMUSCULAR | Status: DC | PRN
  Filled 2019-08-07: qty 1

## 2019-08-07 MED ORDER — OXYTOCIN BOLUS FROM INFUSION
500.0000 mL | Freq: Once | INTRAVENOUS | Status: AC
  Administered 2019-08-07: 19:00:00 500 mL via INTRAVENOUS

## 2019-08-07 MED ORDER — OXYCODONE-ACETAMINOPHEN 5-325 MG PO TABS: 2.0000 | ORAL_TABLET | ORAL | Status: DC | PRN

## 2019-08-07 MED ORDER — EPHEDRINE 5 MG/ML INJ: 10.0000 mg | INTRAVENOUS | Status: DC | PRN

## 2019-08-07 MED ORDER — LACTATED RINGERS IV SOLN: INTRAVENOUS | Status: DC

## 2019-08-07 MED ORDER — PHENYLEPHRINE 40 MCG/ML (10ML) SYRINGE FOR IV PUSH (FOR BLOOD PRESSURE SUPPORT)
80.0000 ug | PREFILLED_SYRINGE | INTRAVENOUS | Status: DC | PRN
  Filled 2019-08-07: qty 10

## 2019-08-07 MED ORDER — FERROUS SULFATE 325 (65 FE) MG PO TABS
325.0000 mg | ORAL_TABLET | Freq: Two times a day (BID) | ORAL | Status: DC
  Administered 2019-08-08 – 2019-08-09 (×3): 325 mg via ORAL
  Filled 2019-08-07 (×3): qty 1

## 2019-08-07 MED ORDER — ONDANSETRON HCL 4 MG/2ML IJ SOLN: 4.0000 mg | INTRAMUSCULAR | Status: DC | PRN

## 2019-08-07 MED ORDER — ACETAMINOPHEN 325 MG PO TABS
650.0000 mg | ORAL_TABLET | ORAL | Status: DC | PRN
  Administered 2019-08-07 – 2019-08-09 (×2): 650 mg via ORAL
  Filled 2019-08-07 (×2): qty 2

## 2019-08-07 MED ORDER — SIMETHICONE 80 MG PO CHEW: 80.0000 mg | CHEWABLE_TABLET | ORAL | Status: DC | PRN

## 2019-08-07 MED ORDER — PRENATAL MULTIVITAMIN CH
1.0000 | ORAL_TABLET | Freq: Every day | ORAL | Status: DC
  Administered 2019-08-08 – 2019-08-09 (×2): 1 via ORAL
  Filled 2019-08-07 (×2): qty 1

## 2019-08-07 MED ORDER — FENTANYL-BUPIVACAINE-NACL 0.5-0.125-0.9 MG/250ML-% EP SOLN: 12.0000 mL/h | EPIDURAL | Status: DC | PRN

## 2019-08-07 MED ORDER — OXYTOCIN 40 UNITS IN NORMAL SALINE INFUSION - SIMPLE MED: 2.5000 [IU]/h | INTRAVENOUS | Status: DC

## 2019-08-07 MED ORDER — METHYLERGONOVINE MALEATE 0.2 MG/ML IJ SOLN: 0.2000 mg | INTRAMUSCULAR | Status: DC | PRN

## 2019-08-07 MED ORDER — ONDANSETRON HCL 4 MG/2ML IJ SOLN: 4.0000 mg | Freq: Four times a day (QID) | INTRAMUSCULAR | Status: DC | PRN

## 2019-08-07 MED ORDER — WITCH HAZEL-GLYCERIN EX PADS: 1.0000 "application " | MEDICATED_PAD | CUTANEOUS | Status: DC | PRN

## 2019-08-07 MED ORDER — LIDOCAINE HCL (PF) 1 % IJ SOLN
INTRAMUSCULAR | Status: DC | PRN
  Administered 2019-08-07 (×2): 5 mL via EPIDURAL

## 2019-08-07 MED ORDER — LIDOCAINE HCL (PF) 1 % IJ SOLN
30.0000 mL | INTRAMUSCULAR | Status: AC | PRN
  Administered 2019-08-07: 19:00:00 30 mL via SUBCUTANEOUS
  Filled 2019-08-07: qty 30

## 2019-08-07 MED ORDER — METHYLERGONOVINE MALEATE 0.2 MG PO TABS: 0.2000 mg | ORAL_TABLET | ORAL | Status: DC | PRN

## 2019-08-07 MED ORDER — DIPHENHYDRAMINE HCL 50 MG/ML IJ SOLN: 12.5000 mg | INTRAMUSCULAR | Status: DC | PRN

## 2019-08-07 MED ORDER — SENNOSIDES-DOCUSATE SODIUM 8.6-50 MG PO TABS
2.0000 | ORAL_TABLET | ORAL | Status: DC
  Administered 2019-08-07 – 2019-08-09 (×2): 2 via ORAL
  Filled 2019-08-07: qty 2

## 2019-08-07 MED ORDER — LACTATED RINGERS AMNIOINFUSION: INTRAVENOUS | Status: DC

## 2019-08-07 MED ORDER — ACETAMINOPHEN 325 MG PO TABS: 650.0000 mg | ORAL_TABLET | ORAL | Status: DC | PRN

## 2019-08-07 MED ORDER — COCONUT OIL OIL: 1.0000 "application " | TOPICAL_OIL | Status: DC | PRN

## 2019-08-07 MED ORDER — BENZOCAINE-MENTHOL 20-0.5 % EX AERO
1.0000 "application " | INHALATION_SPRAY | CUTANEOUS | Status: DC | PRN
  Administered 2019-08-07: 1 via TOPICAL
  Filled 2019-08-07: qty 56

## 2019-08-07 MED ORDER — DIBUCAINE (PERIANAL) 1 % EX OINT: 1.0000 "application " | TOPICAL_OINTMENT | CUTANEOUS | Status: DC | PRN

## 2019-08-07 MED ORDER — LACTATED RINGERS IV SOLN: 500.0000 mL | Freq: Once | INTRAVENOUS | Status: DC

## 2019-08-07 MED ORDER — ONDANSETRON HCL 4 MG PO TABS: 4.0000 mg | ORAL_TABLET | ORAL | Status: DC | PRN

## 2019-08-07 MED ORDER — OXYTOCIN 40 UNITS IN NORMAL SALINE INFUSION - SIMPLE MED
1.0000 m[IU]/min | INTRAVENOUS | Status: DC
  Administered 2019-08-07 (×2): 2 m[IU]/min via INTRAVENOUS
  Filled 2019-08-07: qty 1000

## 2019-08-07 MED ORDER — IBUPROFEN 600 MG PO TABS
600.0000 mg | ORAL_TABLET | Freq: Four times a day (QID) | ORAL | Status: DC
  Administered 2019-08-07 – 2019-08-09 (×7): 600 mg via ORAL
  Filled 2019-08-07 (×6): qty 1

## 2019-08-07 MED ORDER — SOD CITRATE-CITRIC ACID 500-334 MG/5ML PO SOLN: 30.0000 mL | ORAL | Status: DC | PRN

## 2019-08-07 MED ORDER — ZOLPIDEM TARTRATE 5 MG PO TABS: 5.0000 mg | ORAL_TABLET | Freq: Every evening | ORAL | Status: DC | PRN

## 2019-08-07 MED ORDER — OXYCODONE-ACETAMINOPHEN 5-325 MG PO TABS: 1.0000 | ORAL_TABLET | ORAL | Status: DC | PRN

## 2019-08-07 MED ORDER — LACTATED RINGERS IV SOLN
500.0000 mL | INTRAVENOUS | Status: DC | PRN
  Administered 2019-08-07: 12:00:00 500 mL via INTRAVENOUS

## 2019-08-07 MED ORDER — PHENYLEPHRINE 40 MCG/ML (10ML) SYRINGE FOR IV PUSH (FOR BLOOD PRESSURE SUPPORT): 80.0000 ug | PREFILLED_SYRINGE | INTRAVENOUS | Status: DC | PRN

## 2019-08-07 MED ORDER — FENTANYL-BUPIVACAINE-NACL 0.5-0.125-0.9 MG/250ML-% EP SOLN
EPIDURAL | Status: AC
  Filled 2019-08-07: qty 250

## 2019-08-07 MED ORDER — DIPHENHYDRAMINE HCL 25 MG PO CAPS: 25.0000 mg | ORAL_CAPSULE | Freq: Four times a day (QID) | ORAL | Status: DC | PRN

## 2019-08-07 MED ORDER — FLEET ENEMA 7-19 GM/118ML RE ENEM: 1.0000 | ENEMA | RECTAL | Status: DC | PRN

## 2019-08-07 NOTE — Plan of Care (Signed)
  Problem: Education: Goal: Knowledge of Childbirth will improve Outcome: Progressing Goal: Ability to make informed decisions regarding treatment and plan of care will improve Outcome: Progressing Goal: Ability to state and carry out methods to decrease the pain will improve Outcome: Progressing Goal: Individualized Educational Video(s) Outcome: Progressing   Problem: Coping: Goal: Ability to verbalize concerns and feelings about labor and delivery will improve Outcome: Progressing   Problem: Life Cycle: Goal: Ability to make normal progression through stages of labor will improve Outcome: Progressing Goal: Ability to effectively push during vaginal delivery will improve Outcome: Progressing   Problem: Role Relationship: Goal: Will demonstrate positive interactions with the child Outcome: Progressing   Problem: Safety: Goal: Risk of complications during labor and delivery will decrease Outcome: Progressing   Problem: Pain Management: Goal: Relief or control of pain from uterine contractions will improve Outcome: Progressing   Problem: Health Behavior/Discharge Planning: Goal: Ability to manage health-related needs will improve Outcome: Progressing   Problem: Clinical Measurements: Goal: Ability to maintain clinical measurements within normal limits will improve Outcome: Progressing Goal: Will remain free from infection Outcome: Progressing Goal: Diagnostic test results will improve Outcome: Progressing Goal: Respiratory complications will improve Outcome: Progressing Goal: Cardiovascular complication will be avoided Outcome: Progressing   Problem: Activity: Goal: Risk for activity intolerance will decrease Outcome: Progressing   Problem: Nutrition: Goal: Adequate nutrition will be maintained Outcome: Progressing   Problem: Elimination: Goal: Will not experience complications related to bowel motility Outcome: Progressing Goal: Will not experience  complications related to urinary retention Outcome: Progressing   Problem: Safety: Goal: Ability to remain free from injury will improve Outcome: Progressing   Problem: Skin Integrity: Goal: Risk for impaired skin integrity will decrease Outcome: Progressing

## 2019-08-07 NOTE — H&P (Signed)
Lindsey Stevenson is a 24 y.o. female G1P0 at 40w ks and 5 day  presenting complaining of ROM at approximately 2 am clear fluid at first and now meconium stained per patient. Pregnancy complicated by Fibroids largest 5.7 cm and posterior one is anterior and 3.6 cm. Prenatal care provided by Dr. Janyth Pupa with [facility]. . OB History    Gravida  1   Para      Term      Preterm      AB      Living        SAB      TAB      Ectopic      Multiple      Live Births             Past Medical History:  Diagnosis Date  . Anxiety   . Depression   . Migraine   . Vaginal Pap smear, abnormal 2019   HPV   Past Surgical History:  Procedure Laterality Date  . NO PAST SURGERIES     Family History: family history includes Anxiety disorder in her mother; Arthritis in her maternal grandmother; Asthma in her brother; Cancer in her maternal grandfather and maternal grandmother; Diabetes in her brother; Early death in her sister; Hypertension in her father, mother, and paternal grandmother. Social History:  reports that she has never smoked. She has never used smokeless tobacco. She reports previous alcohol use. She reports that she does not use drugs.     Maternal Diabetes: No Genetic Screening: Normal Maternal Ultrasounds/Referrals: Normal Fetal Ultrasounds or other Referrals:  None Maternal Substance Abuse:  No Significant Maternal Medications:  None Significant Maternal Lab Results:  Group B Strep negative Other Comments:  None  Review of Systems  Constitutional: Negative.   HENT: Negative.   Eyes: Negative.   Respiratory: Negative.   Cardiovascular: Negative.   Gastrointestinal: Negative.   Endocrine: Negative.   Genitourinary: Negative.   Musculoskeletal: Negative.   Skin: Negative.   Allergic/Immunologic: Negative.   Neurological: Negative.   Hematological: Negative.   Psychiatric/Behavioral: Negative.    Maternal Medical History:  Reason for admission:  Rupture of membranes.   Contractions: Onset was 6-12 hours ago.   Frequency: irregular.   Perceived severity is mild.    Fetal activity: Perceived fetal activity is normal.   Last perceived fetal movement was within the past hour.    Prenatal complications: no prenatal complications Prenatal Complications - Diabetes: none.    Dilation: 3 Effacement (%): 100 Station: Plus 1 Exam by:: Dr Landry Mellow Blood pressure 113/76, pulse (!) 102, temperature 98.7 F (37.1 C), temperature source Axillary, resp. rate 18, height 5\' 6"  (1.676 m), weight 97.9 kg, SpO2 100 %. Maternal Exam:  Uterine Assessment: Contraction strength is mild.  Contraction frequency is irregular.   Abdomen: Patient reports no abdominal tenderness. Fetal presentation: vertex  Introitus: Normal vulva. Normal vagina.  Amniotic fluid character: meconium stained.  Pelvis: adequate for delivery.      Fetal Exam Fetal Monitor Review: Baseline rate: 140.  Variability: moderate (6-25 bpm).   Pattern: accelerations present and no decelerations.    Fetal State Assessment: Category I - tracings are normal.     Physical Exam  Vitals reviewed. Constitutional: She is oriented to person, place, and time. She appears well-developed and well-nourished.  HENT:  Head: Normocephalic and atraumatic.  Eyes: Pupils are equal, round, and reactive to light. Conjunctivae are normal.  Cardiovascular: Normal rate and regular rhythm.  Respiratory: Effort normal and breath sounds normal.  GI: There is no abdominal tenderness.  Genitourinary:    Vulva and vagina normal.   Musculoskeletal:        General: Edema present. Normal range of motion.     Cervical back: Normal range of motion and neck supple.  Neurological: She is alert and oriented to person, place, and time.  Skin: Skin is warm and dry.  Psychiatric: She has a normal mood and affect.    Prenatal labs: ABO, Rh: --/--/O POS, O POS Performed at [facility], [place] [address], [place], [place] [address]  [phone]/08 0300) Antibody: NEG (05/08 0300) Rubella:  Immune  RPR: NON REACTIVE (05/08 0300)  HBsAg:   Negative  HIV:   Nonreactive  GBS: Negative/-- (04/09 0000)   Assessment/Plan: 40 wks and 5 days PROM/ latent labor admit to labor and delivery  Epidural for pain control Pitocin for augmentation  Fibroids 5.7 cm posterior and 3.6 cm anterior- should not affect delivery  Aniticipate SVD  Christophe Louis 08/07/2019, 11:34 AM

## 2019-08-07 NOTE — Anesthesia Procedure Notes (Signed)
Epidural Patient location during procedure: OB Start time: 08/07/2019 6:13 AM End time: 08/07/2019 6:23 AM  Staffing Anesthesiologist: Albertha Ghee, MD Performed: anesthesiologist   Preanesthetic Checklist Completed: patient identified, IV checked, site marked, risks and benefits discussed, monitors and equipment checked, pre-op evaluation and timeout performed  Epidural Patient position: sitting Prep: DuraPrep Patient monitoring: heart rate, cardiac monitor, continuous pulse ox and blood pressure Approach: midline Location: L2-L3 Injection technique: LOR saline  Needle:  Needle type: Tuohy  Needle gauge: 17 G Needle length: 9 cm Needle insertion depth: 7 cm Catheter type: closed end flexible Catheter size: 19 Gauge Catheter at skin depth: 12 cm Test dose: negative and Other  Assessment Events: blood not aspirated, injection not painful, no injection resistance and negative IV test  Additional Notes Informed consent obtained prior to proceeding including risk of failure, 1% risk of PDPH, risk of minor discomfort and bruising.  Discussed rare but serious complications including epidural abscess, permanent nerve injury, epidural hematoma.  Discussed alternatives to epidural analgesia and patient desires to proceed.  Timeout performed pre-procedure verifying patient name, procedure, and platelet count.  Patient tolerated procedure well. Reason for block:procedure for pain

## 2019-08-07 NOTE — Progress Notes (Signed)
FHR baseline 140s w/ moderate variablity, 15x15, with recurrent early decelerations.  Pt defers vaginal exam until "later".  Request honored.  Pt turned to lt lateral with a peanut ball.

## 2019-08-07 NOTE — MAU Note (Signed)
Pt presents to MAU stating her water broke at 0220 yellow fluid. Pt reports ctx as well that she is breathing through. Pt has not felt FM since her water broke.

## 2019-08-07 NOTE — Plan of Care (Signed)
L&D careplan completed 

## 2019-08-07 NOTE — Discharge Instructions (Signed)

## 2019-08-07 NOTE — Anesthesia Preprocedure Evaluation (Signed)
Anesthesia Evaluation  Patient identified by MRN, date of birth, ID band Patient awake    Reviewed: Allergy & Precautions, H&P , NPO status , Patient's Chart, lab work & pertinent test results  Airway Mallampati: II   Neck ROM: full    Dental   Pulmonary neg pulmonary ROS,    breath sounds clear to auscultation       Cardiovascular negative cardio ROS   Rhythm:regular Rate:Normal     Neuro/Psych  Headaches, PSYCHIATRIC DISORDERS Anxiety Depression    GI/Hepatic   Endo/Other    Renal/GU      Musculoskeletal   Abdominal   Peds  Hematology   Anesthesia Other Findings   Reproductive/Obstetrics (+) Pregnancy                             Anesthesia Physical Anesthesia Plan  ASA: II  Anesthesia Plan: Epidural   Post-op Pain Management:    Induction: Intravenous  PONV Risk Score and Plan: 2 and Treatment may vary due to age or medical condition  Airway Management Planned: Natural Airway  Additional Equipment:   Intra-op Plan:   Post-operative Plan:   Informed Consent: I have reviewed the patients History and Physical, chart, labs and discussed the procedure including the risks, benefits and alternatives for the proposed anesthesia with the patient or authorized representative who has indicated his/her understanding and acceptance.       Plan Discussed with: Anesthesiologist  Anesthesia Plan Comments:         Anesthesia Quick Evaluation

## 2019-08-08 LAB — CBC
HCT: 31.7 % — ABNORMAL LOW (ref 36.0–46.0)
Hemoglobin: 10.2 g/dL — ABNORMAL LOW (ref 12.0–15.0)
MCH: 30.4 pg (ref 26.0–34.0)
MCHC: 32.2 g/dL (ref 30.0–36.0)
MCV: 94.3 fL (ref 80.0–100.0)
Platelets: 163 10*3/uL (ref 150–400)
RBC: 3.36 MIL/uL — ABNORMAL LOW (ref 3.87–5.11)
RDW: 13.3 % (ref 11.5–15.5)
WBC: 11.7 10*3/uL — ABNORMAL HIGH (ref 4.0–10.5)
nRBC: 0 % (ref 0.0–0.2)

## 2019-08-08 NOTE — Lactation Note (Addendum)
This note was copied from a baby's chart. Lactation Consultation Note Baby 48 hrs old. Mom trying to BF in cradle position unable to maintain latch. Attempted cross cradle, mom having hard time w/this position. Positioning, support, and comfort discussed. Placed in football position.   Baby would latch, bites. Chin tug helpful. Baby only suckled a few times, latch, pop off and on not BF.  Mom has everted compressible nipples. No reason why baby will not latch other than he just doesn't have sucking pattern at this time. W/glove finger attempted suck training. Baby has tight chomp. Massaged tongue and palate. Baby frustrated.  [facility] taught mom how to hand express and spoon feed colostrum. Gave baby 5 ml colostrum.   Mom had stool, mom changed diaper. Teaching done while mom changed diaper. Newborn feeding habits, behavior, STS, I&O, breast massage, milk storage, supply and demand discussed. Mom encouraged to feed baby 8-12 times/24 hours and with feeding cues.   Gave mom hand pump for extra stimulation.  Encouraged mom to call for assistance if needed. Lactation brochure given.  Patient Name: Lindsey Stevenson [id number] Date: 08/08/2019 Reason for consult: Initial assessment;Primapara;Term   Maternal Data Has patient been taught Hand Expression?: Yes Does the patient have breastfeeding experience prior to this delivery?: No  Feeding Feeding Type: Breast Milk  LATCH Score Latch: Too sleepy or reluctant, no latch achieved, no sucking elicited.  Audible Swallowing: None  Type of Nipple: Everted at rest and after stimulation  Comfort (Breast/Nipple): Soft / non-tender  Hold (Positioning): Full assist, staff holds infant at breast  LATCH Score: 4  Interventions Interventions: Breast feeding basics reviewed;Support pillows;Assisted with latch;Position options;Skin to skin;Expressed milk;Breast massage;Hand express;Breast compression;Adjust position;Hand pump  Lactation Tools  Discussed/Used Tools: Pump Breast pump type: Manual WIC Program: No Pump Review: Setup, frequency, and cleaning;Milk Storage Initiated by:: [name] RN IBCLC Date initiated:: 08/08/19   Consult Status Consult Status: Follow-up Date: 08/08/19 Follow-up type: In-patient    Theodoro Kalata 08/08/2019, 4:26 AM

## 2019-08-08 NOTE — Progress Notes (Signed)
Post Partum Day 1 Subjective: no complaints, up ad lib, voiding and tolerating PO  Objective: Blood pressure 123/78, pulse 89, temperature 98.4 F (36.9 C), temperature source Oral, resp. rate 18, height 5\' 6"  (1.676 m), weight 97.9 kg, SpO2 100 %, unknown if currently breastfeeding.  Physical Exam:  General: alert, cooperative and no distress Lochia: appropriate Uterine Fundus: firm Incision: NA DVT Evaluation: No evidence of DVT seen on physical exam.  Recent Labs    08/07/19 0300 08/08/19 0550  HGB 12.1 10.2*  HCT 36.5 31.7*    Assessment/Plan: Plan for discharge tomorrow and Breastfeeding  Routine postpartum care  Pt planning on outpatient circumcision.    LOS: 1 day   Lindsey Stevenson 08/08/2019, 2:25 PM

## 2019-08-08 NOTE — Progress Notes (Signed)
CSW received consult for hx of Anxiety and Depression.    CSW met with MOB at bedside to offer support and complete assessment. FOB and infant Verlin Fester were present at time of arrival.  MOB gave CSW permission to conduct assessment while FOB in room due to FOB being asleep and difficult to awaken. After assessment, MOB was able to awaken FOB after loudly calling his name 3 times and asking him to participate. MOB and FOB were pleasant and engaged during visit.   During assessment, MOB reported hx of depression and generalized anxiety disorder. MOB reported last episode of depression was early in the week related to disappointment with surpassing due date. 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 MOB reported eating healthy, exercising, and faith as coping strategies. MOB identified FOB and mom as support system. MOB denied any current sx of dep/anx and stated "I'm happy because he is finally here and healthy". MOB denied any other mental health dx or Rx history. MOB denied any SI, HI, or domestic violence. MOB stated interest in pursuing individual counseling. CSW provided resource list and psychologytoday.com for additional exploration. MOB stated appreciation.   CSW provided education regarding the baby blues period vs. perinatal mood disorders, discussed treatment and gave resources for mental health follow up if concerns arise.  CSW recommends self-evaluation during the postpartum time period using the New Mom Checklist from Postpartum Progress and encouraged MOB and FOB  to contact a medical professional if symptoms are noted at any time.  MOB and FOB denied any questions.   CSW provided review of Sudden Infant Death Syndrome (SIDS) precautions.  MOB confirmed having all needed items for baby including car seat and bassinet for baby's safe sleep.   CSW identifies no  further need for intervention and no barriers to discharge at this time.  Nic Lampe D. Lissa Morales, MSW, [facility] Clinical Social Worker [phone]

## 2019-08-08 NOTE — Anesthesia Postprocedure Evaluation (Signed)
Anesthesia Post Note  Patient: Lindsey Stevenson  Procedure(s) Performed: AN AD HOC LABOR EPIDURAL     Patient location during evaluation: Mother Baby Anesthesia Type: Epidural Level of consciousness: awake and alert Pain management: pain level controlled Vital Signs Assessment: post-procedure vital signs reviewed and stable Respiratory status: spontaneous breathing, nonlabored ventilation and respiratory function stable Cardiovascular status: stable Postop Assessment: no headache, no backache, epidural receding, no apparent nausea or vomiting, patient able to bend at knees, adequate PO intake and able to ambulate Anesthetic complications: no    Last Vitals:  Vitals:   08/08/19 0047 08/08/19 0500  BP: 106/70 126/78  Pulse: 99 95  Resp: 15 18  Temp: 37 C 36.8 C  SpO2: 100% 100%    Last Pain:  Vitals:   08/08/19 0730  TempSrc:   PainSc: Asleep   Pain Goal:                   [employer]

## 2019-08-09 MED ORDER — IBUPROFEN 600 MG PO TABS: 600.0000 mg | ORAL_TABLET | Freq: Four times a day (QID) | ORAL | 0 refills | Status: DC

## 2019-08-09 MED ORDER — LACTATED RINGERS IV SOLN: INTRAVENOUS | Status: DC

## 2019-08-09 MED ORDER — FAMOTIDINE 20 MG PO TABS: 40.0000 mg | ORAL_TABLET | Freq: Once | ORAL | Status: DC

## 2019-08-09 MED ORDER — METOCLOPRAMIDE HCL 10 MG PO TABS: 10.0000 mg | ORAL_TABLET | Freq: Once | ORAL | Status: DC

## 2019-08-09 NOTE — Lactation Note (Signed)
This note was copied from a baby's chart. Lactation Consultation Note:   Patient Name: Lindsey Stevenson [name] Date: 08/09/2019 Reason for consult: Follow-up assessment    Mother reports that she really wanted to breast feed infant.  She reports that she feels pain when she breastfeeds.   LC offered to assist with latch. Mother declines at this time. Mother reports that infant has just been bottle fed.  MGM holding infant . Mother reports that she was told that infant has a tongue tie  and a lip tie. Mother was advised to page [facility] when infant is ready to breastfeed next.  Mother was given handout to follow to assess lip and tongue tie with a Peds dentist.  Mother receptive to all teaching.    Maternal Data    Feeding Feeding Type: Formula Nipple Type: Slow - flow  LATCH Score                   Interventions    Lactation Tools Discussed/Used     Consult Status Consult Status: Complete    Darla Lesches 08/09/2019, 3:02 PM

## 2019-08-09 NOTE — Lactation Note (Addendum)
This note was copied from a baby's chart. Lactation Consultation Note Baby 58 hrs old. Noted baby has been only having 5 minute BF then receiving formula via bottle. Asked mom why baby will not BF longer than 5 minutes. Mom states he will not even take her breast now. He acts like he can't find the nipple or he gets on then off.  Mom has everted compressible nipples. Assisted in football position. Mom stated that is the only position she can latch in when he will latch. Baby doesn't have a wide flange. Stimulated baby w/nipple to top lip, mom cringes with pain. Mom states baby bites and has a hard suck. LC assessed suckle, and mom is correct. Baby has a tight suck.  Asked mom if she has tried NS to widen his flange, mom stated no that she would try it. Mom stated it still hurts and he's clamping Mom stated she wanted to pump and bottle feed. Tried #24 which was to big for the baby's mouth. Baby couldn't keep lips flanged. #20 still hurt. Mom stated She'd rather pump and bottle feed. RN stated mom told her that she was thinking about doing that.  Mom has DEBP at home. LC told mom that she can start pumping before she goes home that she needs to be pumping every 3 hrs. LC encouraged mom to start pumping before she leaves, mom stated she would pump when she gets home.  Engorgement, milk storage, breast massage, pumping, supply and demand discussed. LC stressed importance of pumping and breast massage during pumping. [place] educated mom on how to make a hands free bra. At length discussed pumping and bottle feeding. Importance of I&O documentation.  Encouraged mom to call for f/u appt. W/OP LC to see if baby will latch. Referral made.  Patient Name: Lindsey Stevenson [name] Date: 08/09/2019 Reason for consult: Follow-up assessment;Primapara;Term   Maternal Data    Feeding Feeding Type: Formula Nipple Type: Slow - flow  LATCH Score Latch: Repeated attempts needed to sustain latch,  nipple held in mouth throughout feeding, stimulation needed to elicit sucking reflex.  Audible Swallowing: None  Type of Nipple: Everted at rest and after stimulation  Comfort (Breast/Nipple): Filling, red/small blisters or bruises, mild/mod discomfort  Hold (Positioning): Full assist, staff holds infant at breast  LATCH Score: 4  Interventions Interventions: Breast feeding basics reviewed;Support pillows;Assisted with latch;Position options;Skin to skin;Breast massage;Hand express;Breast compression;Adjust position  Lactation Tools Discussed/Used Tools: Nipple Shields Nipple shield size: 20   Consult Status Consult Status: Complete Date: 08/09/19    Theodoro Kalata 08/09/2019, 6:23 AM

## 2019-08-09 NOTE — Discharge Summary (Signed)
Postpartum Discharge Summary       Patient Name: Lindsey Stevenson DOB: 18-Nov-1995 MRN: [id number]  Date of admission: 08/07/2019 Delivery date:08/07/2019  Delivering provider: Christophe Louis  Date of discharge: 08/09/2019  Admitting diagnosis: Indication for care in labor or delivery [O75.9] Intrauterine pregnancy: [redacted]w[redacted]d    Secondary diagnosis:  Active Problems:   Indication for care in labor or delivery  Additional problems: PROM, latent labor, Fibroids    Discharge diagnosis: Term Pregnancy Delivered                                              Post partum procedures:None Augmentation: Pitocin Complications: None  Hospital course: Onset of Labor With Vaginal Delivery      24y.o. yo G1P1001 at 435w5das admitted in Latent Labor on 08/07/2019. Patient had an uncomplicated labor course as follows:  Membrane Rupture Time/Date: 2:20 AM ,08/07/2019   Delivery Method:Vaginal, Spontaneous  Episiotomy: None  Lacerations:  1st degree;Labial  Patient had an uncomplicated postpartum course.  She is ambulating, tolerating a regular diet, passing flatus, and urinating well. Patient is discharged home in stable condition on 09/02/19.  Newborn Data: Birth date:08/07/2019  Birth time:6:30 PM  Gender:Female  Living status:Living  Apgars:9 ,9  Weight:3.445 kg   Magnesium Sulfate received: No BMZ received: No Rhophylac:No MMR:No T-DaP:See prenatal record Flu: No Transfusion:No  Physical exam  Vitals:   08/08/19 1555 08/08/19 1934 08/08/19 2100 08/09/19 0601  BP: 112/72 114/71 131/84 132/81  Pulse: 83 85 92   Resp: '17 16 18 17  '$ Temp: 98.8 F (37.1 C) 97.8 F (36.6 C) 98.2 F (36.8 C) 98.1 F (36.7 C)  TempSrc: Oral Oral Oral Oral  SpO2:  100%  100%  Weight:      Height:       General: alert, cooperative and no distress Lochia: appropriate Uterine Fundus: firm Incision: N/A DVT Evaluation: No evidence of DVT seen on physical exam. Labs: Lab Results  Component Value Date   WBC  11.7 (H) 08/08/2019   HGB 10.2 (L) 08/08/2019   HCT 31.7 (L) 08/08/2019   MCV 94.3 08/08/2019   PLT 163 08/08/2019   CMP Latest Ref Rng & Units 07/29/2019  Glucose 70 - 99 mg/dL 89  BUN 6 - 20 mg/dL 6  Creatinine 0.44 - 1.00 mg/dL 0.89  Sodium 135 - 145 mmol/L 139  Potassium 3.5 - 5.1 mmol/L 4.1  Chloride 98 - 111 mmol/L 107  CO2 22 - 32 mmol/L 23  Calcium 8.9 - 10.3 mg/dL 9.1  Total Protein 6.5 - 8.1 g/dL 5.7(L)  Total Bilirubin 0.3 - 1.2 mg/dL 0.4  Alkaline Phos 38 - 126 U/L 89  AST 15 - 41 U/L 16  ALT 0 - 44 U/L 15   Edinburgh Score: Edinburgh Postnatal Depression Scale Screening Tool 08/08/2019  I have been able to laugh and see the funny side of things. 0  I have looked forward with enjoyment to things. 0  I have blamed myself unnecessarily when things went wrong. 0  I have been anxious or worried for no good reason. 1  I have felt scared or panicky for no good reason. 1  Things have been getting on top of me. 1  I have been so unhappy that I have had difficulty sleeping. 2  I have felt sad or miserable. 1  I  have been so unhappy that I have been crying. 1  The thought of harming myself has occurred to me. 0  Edinburgh Postnatal Depression Scale Total 7      After visit meds:  Allergies as of 08/09/2019   No Known Allergies     Medication List    TAKE these medications   ibuprofen 600 MG tablet Commonly known as: ADVIL Take 1 tablet (600 mg total) by mouth every 6 (six) hours.   prenatal multivitamin Tabs tablet Take 1 tablet by mouth daily at 12 noon.        Discharge home in stable condition Infant Feeding: Breast Infant Disposition:home with mother Discharge instruction: per After Visit Summary and Postpartum booklet. Activity: Advance as tolerated. Pelvic rest for 6 weeks.  Diet: routine diet Anticipated Birth Control: Discussed Postpartum Appointment:2 weeks Additional Postpartum F/U: Postpartum Depression checkup Future Appointments:No future  appointments. Follow up Visit: Follow-up Information    Christophe Louis, MD. Schedule an appointment as soon as possible for a visit in 2 week(s).   Specialty: Obstetrics and Gynecology Why: Baby blues check Contact information: [address] [place] [id number] [phone]               08/09/2019 Thurnell Lose, MD

## 2019-08-10 ENCOUNTER — Inpatient Hospital Stay (HOSPITAL_COMMUNITY): Payer: No Typology Code available for payment source

## 2019-08-10 ENCOUNTER — Inpatient Hospital Stay (HOSPITAL_COMMUNITY)
Admission: AD | Admit: 2019-08-10 | Payer: No Typology Code available for payment source | Source: Home / Self Care | Admitting: Obstetrics & Gynecology

## 2020-04-14 ENCOUNTER — Other Ambulatory Visit: Payer: No Typology Code available for payment source

## 2021-02-12 LAB — OB RESULTS CONSOLE RUBELLA ANTIBODY, IGM: Rubella: IMMUNE

## 2021-02-12 LAB — OB RESULTS CONSOLE HIV ANTIBODY (ROUTINE TESTING): HIV: NONREACTIVE

## 2021-02-12 LAB — OB RESULTS CONSOLE HEPATITIS B SURFACE ANTIGEN: Hepatitis B Surface Ag: NEGATIVE

## 2021-02-12 LAB — HEPATITIS C ANTIBODY: HCV Ab: NEGATIVE

## 2021-02-21 ENCOUNTER — Other Ambulatory Visit: Payer: Self-pay

## 2021-02-21 ENCOUNTER — Ambulatory Visit (INDEPENDENT_AMBULATORY_CARE_PROVIDER_SITE_OTHER): Payer: BC Managed Care – PPO | Admitting: Internal Medicine

## 2021-02-21 VITALS — BP 100/62 | HR 81 | Ht 67.0 in | Wt 171.4 lb

## 2021-02-21 DIAGNOSIS — R9431 Abnormal electrocardiogram [ECG] [EKG]: Secondary | ICD-10-CM

## 2021-02-21 NOTE — Progress Notes (Signed)
Cardiology Office Note:    Date:  02/21/2021   ID:  Lindsey Stevenson, DOB 09-Jul-1995, MRN [id number]  PCP:  Kathyrn Lass, MD   [place] Providers Cardiologist:  None     Referring MD: Kathyrn Lass, MD   No chief complaint on file. Palpitation  History of Present Illness:    Lindsey Stevenson is a 25 y.o. female with a hx of  G1P2 [redacted] weeks pregnant, uncomplicated delivery, anxiety, referral for palpitations  She states that she went to urgent care and was at work. She was dizzy and felt she would pass out. She found out later that she was pregnant. No syncope. She notes heart racing. She was exercising and her rates went up to 200 beats. She was having anxiety and panic attacks. She notes palpitations with COVID.  She notes recurrence with stress.  Her symptoms are not daily. She has a stressful job. No heart disease history. PGM has atrial fibrillation. No coronary dx family hx. No 1st degree with SCD. She stopped caffeine. No alcohol use. She is only taking the prenatal vitamin. No smoking.Her first pregnancy went well, no issues. 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  TSH 0.74 Crt 0.7  Past Medical History:  Diagnosis Date   Anxiety    Depression    Migraine    Vaginal Pap smear, abnormal 2019   HPV    Past Surgical History:  Procedure Laterality Date   NO PAST SURGERIES      Current Medications: No outpatient medications have been marked as taking for the 02/21/21 encounter (Appointment) with Janina Mayo, MD.     Allergies:   Patient has no known allergies.   Social History   Socioeconomic History   Marital status: Married    Spouse name: Jared   Number of children: Not on file   Years of education: Not on file   Highest education level: Not on file  Occupational History   Not on file  Tobacco Use   Smoking status: Never   Smokeless tobacco: Never  Vaping Use   Vaping Use: Never used  Substance and Sexual Activity   Alcohol use: Not Currently   Drug  use: Never   Sexual activity: Yes    Birth control/protection: None  Other Topics Concern   Not on file  Social History Narrative   Not on file   Social Determinants of Health   Financial Resource Strain: Not on file  Food Insecurity: Not on file  Transportation Needs: Not on file  Physical Activity: Not on file  Stress: Not on file  Social Connections: Not on file     Family History: The patient's family history includes Anxiety disorder in her mother; Arthritis in her maternal grandmother; Asthma in her brother; Cancer in her maternal grandfather and maternal grandmother; Diabetes in her brother; Early death in her sister; Hypertension in her father, mother, and paternal grandmother.  ROS:   Please see the history of present illness.     All other systems reviewed and are negative.  EKGs/Labs/Other Studies Reviewed:    The following studies were reviewed today:   EKG:  EKG is  ordered today.  The ekg ordered today demonstrates   NSR, no ischemic changes  Recent Labs: No results found for requested labs within last 8760 hours.  Recent Lipid Panel No results found for: CHOL, TRIG, HDL, CHOLHDL, VLDL, LDLCALC, LDLDIRECT   Risk Assessment/Calculations:           Physical Exam:  VS:  There were no vitals taken for this visit.    Wt Readings from Last 3 Encounters:  08/07/19 215 lb 14.4 oz (97.9 kg)  08/06/19 215 lb 14.4 oz (97.9 kg)  07/29/19 212 lb 6.4 oz (96.3 kg)     GEN:  Well nourished, well developed in no acute distress HEENT: Normal NECK: No JVD; No carotid bruits LYMPHATICS: No lymphadenopathy CARDIAC: RRR, no murmurs, rubs, gallops RESPIRATORY:  Clear to auscultation without rales, wheezing or rhonchi  ABDOMEN: Soft, non-tender, non-distended MUSCULOSKELETAL:  No edema; No deformity  SKIN: Warm and dry NEUROLOGIC:  Alert and oriented x 3 PSYCHIATRIC:  Normal affect   ASSESSMENT:    #Palpitations: She does not have high risk features  including syncope c/f arrhythmia , family hx of SCD, or abnormalities on her EKG. We discussed with shared decision making how to approach her symptoms. We decided that if her symptoms were to progress to let us know at which time we can monitor her with a zio. Otherwise, will leave follow up to an as needed bases  PLAN:    In order of problems listed above:  PRN Follow up      Medication Adjustments/Labs and Tests Ordered: Current medicines are reviewed at length with the patient today.  Concerns regarding medicines are outlined above.    Signed, Janina Mayo, MD  02/21/2021 7:53 AM    [facility]

## 2021-02-21 NOTE — Patient Instructions (Signed)
Medication Instructions:  Your physician recommends that you continue on your current medications as directed. Please refer to the Current Medication list given to you today.  *If you need a refill on your cardiac medications before your next appointment, please call your pharmacy*   Follow-Up: At [facility], you and your health needs are our priority.  As part of our continuing mission to provide you with exceptional heart care, we have created designated Provider Care Teams.  These Care Teams include your primary Cardiologist (physician) and Advanced Practice Providers (APPs -  Physician Assistants and Nurse Practitioners) who all work together to provide you with the care you need, when you need it.  We recommend signing up for the patient portal called "MyChart".  Sign up information is provided on this After Visit Summary.  MyChart is used to connect with patients for Virtual Visits (Telemedicine).  Patients are able to view lab/test results, encounter notes, upcoming appointments, etc.  Non-urgent messages can be sent to your provider as well.   To learn more about what you can do with MyChart, go to [email or web address].    Your next appointment:   We will see you on an as needed basis.  Provider:   Mary [place], MD 

## 2021-03-15 ENCOUNTER — Other Ambulatory Visit: Payer: Self-pay

## 2021-03-15 ENCOUNTER — Encounter (HOSPITAL_COMMUNITY): Payer: Self-pay

## 2021-03-15 ENCOUNTER — Emergency Department (HOSPITAL_COMMUNITY)
Admission: EM | Admit: 2021-03-15 | Discharge: 2021-03-15 | Disposition: A | Discharge status: Home / Self Care | Payer: BC Managed Care – PPO | Attending: Student | Admitting: Student

## 2021-03-15 ENCOUNTER — Emergency Department (HOSPITAL_BASED_OUTPATIENT_CLINIC_OR_DEPARTMENT_OTHER): Payer: BC Managed Care – PPO

## 2021-03-15 DIAGNOSIS — R9431 Abnormal electrocardiogram [ECG] [EKG]: Secondary | ICD-10-CM

## 2021-03-15 DIAGNOSIS — I471 Supraventricular tachycardia: Secondary | ICD-10-CM | POA: Insufficient documentation

## 2021-03-15 DIAGNOSIS — R0789 Other chest pain: Secondary | ICD-10-CM | POA: Diagnosis present

## 2021-03-15 LAB — CBC WITH DIFFERENTIAL/PLATELET
Abs Immature Granulocytes: 0.03 10*3/uL (ref 0.00–0.07)
Basophils Absolute: 0 10*3/uL (ref 0.0–0.1)
Basophils Relative: 0 %
Eosinophils Absolute: 0.1 10*3/uL (ref 0.0–0.5)
Eosinophils Relative: 1 %
HCT: 33.2 % — ABNORMAL LOW (ref 36.0–46.0)
Hemoglobin: 11.4 g/dL — ABNORMAL LOW (ref 12.0–15.0)
Immature Granulocytes: 0 %
Lymphocytes Relative: 18 %
Lymphs Abs: 1.6 10*3/uL (ref 0.7–4.0)
MCH: 31 pg (ref 26.0–34.0)
MCHC: 34.3 g/dL (ref 30.0–36.0)
MCV: 90.2 fL (ref 80.0–100.0)
Monocytes Absolute: 0.6 10*3/uL (ref 0.1–1.0)
Monocytes Relative: 7 %
Neutro Abs: 6.5 10*3/uL (ref 1.7–7.7)
Neutrophils Relative %: 74 %
Platelets: 241 10*3/uL (ref 150–400)
RBC: 3.68 MIL/uL — ABNORMAL LOW (ref 3.87–5.11)
RDW: 12.3 % (ref 11.5–15.5)
WBC: 8.7 10*3/uL (ref 4.0–10.5)
nRBC: 0 % (ref 0.0–0.2)

## 2021-03-15 LAB — ECHOCARDIOGRAM COMPLETE
AR max vel: 2.05 cm2
AV Area VTI: 2.14 cm2
AV Area mean vel: 2.03 cm2
AV Mean grad: 5 mmHg
AV Peak grad: 10 mmHg
Ao pk vel: 1.59 m/s
Area-P 1/2: 3.89 cm2
Height: 67 in
S' Lateral: 2.9 cm
Weight: 2740.76 oz

## 2021-03-15 LAB — COMPREHENSIVE METABOLIC PANEL
ALT: 11 U/L (ref 0–44)
AST: 15 U/L (ref 15–41)
Albumin: 3.1 g/dL — ABNORMAL LOW (ref 3.5–5.0)
Alkaline Phosphatase: 36 U/L — ABNORMAL LOW (ref 38–126)
Anion gap: 6 (ref 5–15)
BUN: 5 mg/dL — ABNORMAL LOW (ref 6–20)
CO2: 22 mmol/L (ref 22–32)
Calcium: 8.9 mg/dL (ref 8.9–10.3)
Chloride: 108 mmol/L (ref 98–111)
Creatinine, Ser: 0.56 mg/dL (ref 0.44–1.00)
GFR, Estimated: >60 mL/min (ref >60)
Glucose, Bld: 89 mg/dL (ref 70–99)
Potassium: 4.1 mmol/L (ref 3.5–5.1)
Sodium: 136 mmol/L (ref 135–145)
Total Bilirubin: 0.3 mg/dL (ref 0.3–1.2)
Total Protein: 6.1 g/dL — ABNORMAL LOW (ref 6.5–8.1)

## 2021-03-15 LAB — TROPONIN I (HIGH SENSITIVITY)
Troponin I (High Sensitivity): 8 ng/L (ref <18)
Troponin I (High Sensitivity): 12 ng/L (ref <18)

## 2021-03-15 MED ORDER — LACTATED RINGERS IV BOLUS
500.0000 mL | Freq: Once | INTRAVENOUS | Status: AC
  Administered 2021-03-15: 500 mL via INTRAVENOUS

## 2021-03-15 NOTE — ED Provider Notes (Signed)
[facility] EMERGENCY DEPARTMENT Provider Note   CSN: [id number] Arrival date & time: 03/15/21  3267     History Chief Complaint  Patient presents with   SVT    Cortana Vanderford is a 25 y.o. female G2, P1 currently [redacted] weeks pregnant who presents the emergency department for evaluation of palpitations and chest tightness.  Patient states that this morning she had sudden onset palpitations with associated chest tightness and shortness of breath.  She states that her apple watch told her that her heart rate was approximately 210 bpm.  EMS was called who apparently also confirmed this heart rate.  EMS performed vagal maneuvers with the patient, blowing on a syringe, and the patient self aborted this rhythm.  She arrives in normal sinus rhythm with no complaints of chest pain, shortness of breath, abdominal pain, nausea, vomiting or other systemic symptoms.  Patient has been evaluated by cardiology last month for this presentation and ultimately discharged with instructions to follow-up if this rhythm returns to be placed on a Holter monitor.  HPI     Past Medical History:  Diagnosis Date   Anxiety    Depression    Migraine    Vaginal Pap smear, abnormal 2019   HPV    Patient Active Problem List   Diagnosis Date Noted   Indication for care in labor or delivery 08/07/2019    Past Surgical History:  Procedure Laterality Date   NO PAST SURGERIES       OB History     Gravida  1   Para  1   Term  1   Preterm      AB      Living  1      SAB      IAB      Ectopic      Multiple  0   Live Births  1           Family History  Problem Relation Age of Onset   Cancer Maternal Grandmother    Arthritis Maternal Grandmother    Cancer Maternal Grandfather    Hypertension Paternal Grandmother    Anxiety disorder Mother    Hypertension Mother    Hypertension Father    Asthma Brother    Diabetes Brother    Early death Sister     Social  History   Tobacco Use   Smoking status: Never   Smokeless tobacco: Never  Vaping Use   Vaping Use: Never used  Substance Use Topics   Alcohol use: Not Currently   Drug use: Never    Home Medications Prior to Admission medications   Medication Sig Start Date End Date Taking? Authorizing Provider  acetaminophen (TYLENOL) 500 MG tablet Take 500 mg by mouth every 6 (six) hours as needed for moderate pain.   Yes [provider]  Prenatal Vit-Fe Fumarate-FA (PRENATAL MULTIVITAMIN) TABS tablet Take 1 tablet by mouth daily at 12 noon.   Yes [provider]  ibuprofen (ADVIL) 600 MG tablet Take 1 tablet (600 mg total) by mouth every 6 (six) hours. Patient not taking: Reported on 02/21/2021 08/09/19   Thurnell Lose, MD    Allergies    Patient has no known allergies.  Review of Systems   Review of Systems  Constitutional:  Negative for chills and fever.  HENT:  Negative for ear pain and sore throat.   Eyes:  Negative for pain and visual disturbance.  Respiratory:  Positive for chest tightness. Negative  for cough and shortness of breath.   Cardiovascular:  Positive for palpitations. Negative for chest pain.  Gastrointestinal:  Negative for abdominal pain and vomiting.  Genitourinary:  Negative for dysuria and hematuria.  Musculoskeletal:  Negative for arthralgias and back pain.  Skin:  Negative for color change and rash.  Neurological:  Negative for seizures and syncope.  All other systems reviewed and are negative.  Physical Exam Updated Vital Signs BP 115/65    Pulse 88    Temp 98.5 F (36.9 C) (Oral)    Resp 20    Ht 5\' 7"  (1.702 m)    Wt 77.7 kg    SpO2 100%    BMI 26.83 kg/m   Physical Exam Vitals and nursing note reviewed.  Constitutional:      General: She is not in acute distress.    Appearance: She is well-developed.  HENT:     Head: Normocephalic and atraumatic.  Eyes:     Conjunctiva/sclera: Conjunctivae normal.  Cardiovascular:     Rate and  Rhythm: Normal rate and regular rhythm.     Heart sounds: No murmur heard. Pulmonary:     Effort: Pulmonary effort is normal. No respiratory distress.     Breath sounds: Normal breath sounds.  Abdominal:     Palpations: Abdomen is soft.     Tenderness: There is no abdominal tenderness.  Musculoskeletal:        General: No swelling.     Cervical back: Neck supple.  Skin:    General: Skin is warm and dry.     Capillary Refill: Capillary refill takes less than 2 seconds.  Neurological:     Mental Status: She is alert.  Psychiatric:        Mood and Affect: Mood normal.    ED Results / Procedures / Treatments   Labs (all labs ordered are listed, but only abnormal results are displayed) Labs Reviewed  COMPREHENSIVE METABOLIC PANEL - Abnormal; Notable for the following components:      Result Value   BUN 5 (*)    Total Protein 6.1 (*)    Albumin 3.1 (*)    Alkaline Phosphatase 36 (*)    All other components within normal limits  CBC WITH DIFFERENTIAL/PLATELET - Abnormal; Notable for the following components:   RBC 3.68 (*)    Hemoglobin 11.4 (*)    HCT 33.2 (*)    All other components within normal limits  TROPONIN I (HIGH SENSITIVITY)  TROPONIN I (HIGH SENSITIVITY)    EKG None  Radiology ECHOCARDIOGRAM COMPLETE  Result Date: 03/15/2021    ECHOCARDIOGRAM REPORT   Patient Name:   AZARIA STEGMAN Date of Exam: 03/15/2021 Medical Rec #:  [id number]       Height:       67.0 in Accession #:    [id number]      Weight:       171.3 lb Date of Birth:  July 11, 1995       BSA:          1.893 m Patient Age:    25 years        BP:           119/75 mmHg Patient Gender: F               HR:           87 bpm. Exam Location:  Inpatient Procedure: 2D Echo, Cardiac Doppler and Color Doppler Indications:    Abnormal EKG  History:  Patient has no prior history of Echocardiogram examinations.                 Abnormal ECG; Arrythmias:Tachycardia. [redacted] weeks gestation 2nd                 child.   Sonographer:    Merrie Roof RDCS Referring Phys: [id number] [place]  1. Left ventricular ejection fraction, by estimation, is 60 to 65%. The left ventricle has normal function. The left ventricle has no regional wall motion abnormalities. Left ventricular diastolic parameters were normal.  2. Right ventricular systolic function is normal. The right ventricular size is normal. There is normal pulmonary artery systolic pressure.  3. Left atrial size was moderately dilated.  4. The mitral valve is normal in structure. Trivial mitral valve regurgitation. No evidence of mitral stenosis.  5. The aortic valve is tricuspid. Aortic valve regurgitation is not visualized. No aortic stenosis is present.  6. The inferior vena cava is normal in size with greater than 50% respiratory variability, suggesting right atrial pressure of 3 mmHg. FINDINGS  Left Ventricle: Left ventricular ejection fraction, by estimation, is 60 to 65%. The left ventricle has normal function. The left ventricle has no regional wall motion abnormalities. The left ventricular internal cavity size was normal in size. There is  no left ventricular hypertrophy. Left ventricular diastolic parameters were normal. Right Ventricle: The right ventricular size is normal. No increase in right ventricular wall thickness. Right ventricular systolic function is normal. There is normal pulmonary artery systolic pressure. The tricuspid regurgitant velocity is 2.36 m/s, and  with an assumed right atrial pressure of 3 mmHg, the estimated right ventricular systolic pressure is 54.6 mmHg. Left Atrium: Left atrial size was moderately dilated. Right Atrium: Right atrial size was normal in size. Pericardium: There is no evidence of pericardial effusion. Mitral Valve: The mitral valve is normal in structure. Trivial mitral valve regurgitation. No evidence of mitral valve stenosis. Tricuspid Valve: The tricuspid valve is normal in structure. Tricuspid valve  regurgitation is trivial. No evidence of tricuspid stenosis. Aortic Valve: The aortic valve is tricuspid. Aortic valve regurgitation is not visualized. No aortic stenosis is present. Aortic valve mean gradient measures 5.0 mmHg. Aortic valve peak gradient measures 10.0 mmHg. Aortic valve area, by VTI measures 2.14  cm. Pulmonic Valve: The pulmonic valve was normal in structure. Pulmonic valve regurgitation is trivial. No evidence of pulmonic stenosis. Aorta: The aortic root is normal in size and structure. Venous: The inferior vena cava is normal in size with greater than 50% respiratory variability, suggesting right atrial pressure of 3 mmHg. IAS/Shunts: No atrial level shunt detected by color flow Doppler.  LEFT VENTRICLE PLAX 2D LVIDd:         4.40 cm   Diastology LVIDs:         2.90 cm   LV e' medial:    13.60 cm/s LV PW:         0.70 cm   LV E/e' medial:  5.4 LV IVS:        0.70 cm   LV e' lateral:   15.60 cm/s LVOT diam:     1.70 cm   LV E/e' lateral: 4.7 LV SV:         54 LV SV Index:   29 LVOT Area:     2.27 cm  RIGHT VENTRICLE RV Basal diam:  3.70 cm LEFT ATRIUM             Index  RIGHT ATRIUM           Index LA diam:        3.10 cm 1.64 cm/m   RA Area:     17.00 cm LA Vol (A2C):   66.0 ml 34.86 ml/m  RA Volume:   48.30 ml  25.51 ml/m LA Vol (A4C):   66.9 ml 35.33 ml/m LA Biplane Vol: 72.7 ml 38.40 ml/m  AORTIC VALVE AV Area (Vmax):    2.05 cm AV Area (Vmean):   2.03 cm AV Area (VTI):     2.14 cm AV Vmax:           158.50 cm/s AV Vmean:          105.500 cm/s AV VTI:            0.252 m AV Peak Grad:      10.0 mmHg AV Mean Grad:      5.0 mmHg LVOT Vmax:         143.00 cm/s LVOT Vmean:        94.300 cm/s LVOT VTI:          0.238 m LVOT/AV VTI ratio: 0.94  AORTA Ao Root diam: 2.60 cm Ao Asc diam:  2.30 cm MITRAL VALVE               TRICUSPID VALVE MV Area (PHT): 3.89 cm    TR Peak grad:   22.3 mmHg MV Decel Time: 195 msec    TR Vmax:        236.00 cm/s MV E velocity: 72.80 cm/s MV A velocity:  66.10 cm/s  SHUNTS MV E/A ratio:  1.10        Systemic VTI:  0.24 m                            Systemic Diam: 1.70 cm Skeet Latch MD Electronically signed by Skeet Latch MD Signature Date/Time: 03/15/2021/3:13:36 PM    Final     Procedures Procedures   Medications Ordered in ED Medications  lactated ringers bolus 500 mL (0 mLs Intravenous Stopped 03/15/21 0929)    ED Course  I have reviewed the triage vital signs and the nursing notes.  Pertinent labs & imaging results that were available during my care of the patient were reviewed by me and considered in my medical decision making (see chart for details).    MDM Rules/Calculators/A&P                           Patient seen the emergency department for evaluation of palpitations.  Physical exam is unremarkable.  On review of EMS run sheet, it appears the patient was in SVT but at no point during her emergency department stay that she reenter into SVT.  Her ECG shows sinus tachycardia that has since improved on reevaluation.  Laboratory evaluation is unremarkable outside of a mild anemia to 11.4 which is consistent with her pregnancy.  Cardiology was consulted who came and evaluated the patient and recommended an echo be performed.  The echo was done and was reassuringly normal, and thus cardiology recommended outpatient follow-up as they do not want to start beta-blockers on a pregnant patient from the emergency department.  On reevaluation, patient is overall well-appearing with no chest pain, shortness of breath and is safe for discharge with outpatient cardiology follow-up. Final Clinical Impression(s) / ED Diagnoses Final diagnoses:  SVT (supraventricular tachycardia) (  [employer])    Rx / DC Orders ED Discharge Orders     None        Leeza Heiner, Debe Coder, MD 03/15/21 [phone]

## 2021-03-15 NOTE — Progress Notes (Signed)
°  Echocardiogram 2D Echocardiogram has been performed.  Merrie Roof F 03/15/2021, 11:26 AM

## 2021-03-15 NOTE — Consult Note (Signed)
Cardiology Consultation:  Patient ID: Lindsey Stevenson MRN: [id number]; DOB: 23-Jan-1996  Admit date: 03/15/2021 Date of Consult: 03/15/2021  Primary Care Provider: Kathyrn Lass, MD Primary Cardiologist: None  Primary Electrophysiologist:  None   Patient Profile:  Lindsey Stevenson is a 25 y.o. female with a hx of palpitations, pregnancy who is being seen today for the evaluation of supraventricular tachycardia at the request of Teressa Lower, MD.  History of Present Illness:  Ms. Mauri presents with palpitations that began earlier this morning.  She has a history of palpitations and what appears to be SVT.  She has been seen by cardiology in the past and conservative measures were recommended.  She is currently pregnant.  Apparently this morning developed rapid heartbeat sensation.  She also reports chest tightness and shortness of breath.  She was evaluated by EMS and I have reviewed the strips.  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  She performed vagal maneuvers and her arrhythmia terminated.  At the time my examination she is resting comfortably in the emergency room.  She denies any chest pain or rapid heartbeat sensation.  Telemetry shows sinus rhythm heart rate in the 80s.  EKG demonstrates sinus rhythm heart rate 94 with early repolarization abnormality.  She has no evidence of a delta wave or accessory pathway.  Cardiovascular examination is normal.  She has no history of congenital heart disease.  No echocardiogram has been performed in our system.  Laboratory data in the emergency room notable for albumin of 3.1.  Hemoglobin 11.4.  Platelets 241.  Serum creatinine is 0.56.  She reports she has struggled with nausea this pregnancy.  She reports she is now drinking and eating better.  She reports water has been a problem for her and dehydration likely has been a big issue.  I suspect this is been a big trigger for her SVT.  There is a TSH documented of 0.74 in the note by  cardiology on 02/21/2021.  I suspect this was checked by her OB/GYN just not available in epic.  Heart Pathway Score:       Past Medical History: Past Medical History:  Diagnosis Date   Anxiety    Depression    Migraine    Vaginal Pap smear, abnormal 2019   HPV    Past Surgical History: Past Surgical History:  Procedure Laterality Date   NO PAST SURGERIES       Home Medications:  Prior to Admission medications   Medication Sig Start Date End Date Taking? Authorizing Provider  ibuprofen (ADVIL) 600 MG tablet Take 1 tablet (600 mg total) by mouth every 6 (six) hours. Patient not taking: Reported on 02/21/2021 08/09/19   Thurnell Lose, MD  Prenatal Vit-Fe Fumarate-FA (PRENATAL MULTIVITAMIN) TABS tablet Take 1 tablet by mouth daily at 12 noon.    [provider]    Inpatient Medications: Scheduled Meds:  Continuous Infusions:  PRN Meds:   Allergies:    No Known Allergies  Social History:   Social History   Socioeconomic History   Marital status: Married    Spouse name: Jared   Number of children: Not on file   Years of education: Not on file   Highest education level: Not on file  Occupational History   Not on file  Tobacco Use   Smoking status: Never   Smokeless tobacco: Never  Vaping Use   Vaping Use: Never used  Substance and Sexual Activity   Alcohol use: Not Currently  Drug use: Never   Sexual activity: Yes    Birth control/protection: None  Other Topics Concern   Not on file  Social History Narrative   Not on file   Social Determinants of Health   Financial Resource Strain: Not on file  Food Insecurity: Not on file  Transportation Needs: Not on file  Physical Activity: Not on file  Stress: Not on file  Social Connections: Not on file  Intimate Partner Violence: Not on file     Family History:    Family History  Problem Relation Age of Onset   Cancer Maternal Grandmother    Arthritis Maternal Grandmother    Cancer Maternal  Grandfather    Hypertension Paternal Grandmother    Anxiety disorder Mother    Hypertension Mother    Hypertension Father    Asthma Brother    Diabetes Brother    Early death Sister      ROS:  All other ROS reviewed and negative. Pertinent positives noted in the HPI.     Physical Exam/Data:   Vitals:   03/15/21 0838 03/15/21 0900 03/15/21 0930 03/15/21 1000  BP: 119/85 118/75 119/72 120/70  Pulse: (!) 105 87 87 81  Resp: (!) 23 19 20 16   Temp: 98.2 F (36.8 C)     TempSrc: Oral     SpO2: 100% 100% 100% 100%  Weight: 77.7 kg     Height: 5\' 7"  (1.702 m)       Intake/Output Summary (Last 24 hours) at 03/15/2021 1030 Last data filed at 03/15/2021 0929 Gross per 24 hour  Intake 851.34 ml  Output --  Net 851.34 ml    Last 3 Weights 03/15/2021 02/21/2021 08/07/2019  Weight (lbs) 171 lb 4.8 oz 171 lb 6.4 oz 215 lb 14.4 oz  Weight (kg) 77.7 kg 77.747 kg 97.932 kg    Body mass index is 26.83 kg/m.  General: Well nourished, well developed, in no acute distress Head: Atraumatic, normal size  Eyes: PEERLA, EOMI  Neck: Supple, no JVD Endocrine: No thryomegaly Cardiac: Normal S1, S2; RRR; no murmurs, rubs, or gallops Lungs: Clear to auscultation bilaterally, no wheezing, rhonchi or rales  Abd: Soft, nontender, no hepatomegaly  Ext: No edema, pulses 2+ Musculoskeletal: No deformities, BUE and BLE strength normal and equal Skin: Warm and dry, no rashes   Neuro: Alert and oriented to person, place, time, and situation, CNII-XII grossly intact, no focal deficits  Psych: Normal mood and affect   EKG:  The EKG was personally reviewed and demonstrates: Normal sinus rhythm heart rate 94, diffuse ST elevation consistent with early repolarization abnormality. Telemetry:  Telemetry was personally reviewed and demonstrates: Sinus rhythm in the 70s  Relevant CV Studies: Echo pending  Laboratory Data: High Sensitivity Troponin:   Recent Labs  Lab 03/15/21 0850  TROPONINIHS 8      Cardiac EnzymesNo results for input(s): TROPONINI in the last 168 hours. No results for input(s): TROPIPOC in the last 168 hours.  Chemistry Recent Labs  Lab 03/15/21 0850  NA 136  K 4.1  CL 108  CO2 22  GLUCOSE 89  BUN 5*  CREATININE 0.56  CALCIUM 8.9  GFRNONAA >60  ANIONGAP 6    Recent Labs  Lab 03/15/21 0850  PROT 6.1*  ALBUMIN 3.1*  AST 15  ALT 11  ALKPHOS 36*  BILITOT 0.3   Hematology Recent Labs  Lab 03/15/21 0850  WBC 8.7  RBC 3.68*  HGB 11.4*  HCT 33.2*  MCV 90.2  MCH  31.0  MCHC 34.3  RDW 12.3  PLT 241   BNPNo results for input(s): BNP, PROBNP in the last 168 hours.  DDimer No results for input(s): DDIMER in the last 168 hours.  Radiology/Studies:  No results found.  Assessment and Plan:   #SVT #Pregnancy -I have reviewed her strips from EMS.  This shows supraventricular tachycardia with heart rate in the 200s.  There is a pseudo-R wave noted.  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 -Her EKG in sinus rhythm demonstrates no accessory pathway or delta wave. -Cardiovascular examination is normal. -TSH Per review of cardiology office visit was normal. 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  I suspect dehydration and poor oral intake is triggered this.  Her arrhythmia terminated with vagal maneuvers.  We discussed that the best option to treat this is vagal maneuvers while she is pregnant.  I would not recommend any medications at this time.  I have also encouraged adequate hydration and good oral intake.  Proper sleep was also recommended as well as regular exercise as deemed appropriate by her obstetrician. -She has had episodes of palpitations in the past and things are worse with pregnancy which is not surprising.  I believe she should be considered for SVT ablation after she has delivered especially if she has expressed an interest in subsequent pregnancies.  For now we will continue with a conservative approach. -We will obtain an echocardiogram while  she is here.  Assuming this is normal she can go home. -We will arrange outpatient follow-up with Dr. Phineas Inches.  She has seen her in the past.  [facility] will sign off.   Medication Recommendations: None Other recommendations (labs, testing, etc): Vagal maneuvers were counseled to the patient.  Other conservative therapies were recommended as above. Follow up as an outpatient: We will arrange hospital follow-up with Dr. Phineas Inches in 2 to 3 weeks.  Echocardiogram is pending.  As long as this is normal she can be discharged.  For questions or updates, please contact [place] Please consult [email or web address] for contact info under   Signed, [place] T. Audie Box, MD, [place]  03/15/2021 10:30 AM

## 2021-03-15 NOTE — ED Triage Notes (Signed)
Pt arrived via [facility] from home for c/o racing heart. Per EMS, pt was in SVT up to 210 for 10-31mins. They had pt do vagal maneuver and it made heart rate drop to 130. Per EMS heart rate was 90 upon arriving here. Pt is [redacted] wks pregnant. EDD 09/26/2021. Pt is A&Ox4. VSS

## 2021-04-01 NOTE — L&D Delivery Note (Signed)
Delivery Note Labor onset:  09/25/21 Labor Onset Time: 2000 Complete dilation at 2:10 AM  Onset of pushing at 0211 FHR second stage Cat 1 Analgesia/Anesthesia intrapartum: Epidural  Guided pushing with maternal urge. Delivery of a viable female at 15. Fetal head delivered in ROA position.  Nuchal cord: x1, reduced.  Infant placed on maternal abd, dried, and tactile stim.  Cord double clamped after 3 min and cut by Father.  RN x2 present for birth.  Cord blood sample collected: Yes Arterial cord blood sample collected: No  Placenta delivered Lindsey Stevenson, intact, with 3 VC.  Placenta to L&D. Uterine tone firm, bleeding none  No laceration identified.  EBL (mL): 50 Complications: none APGAR: APGAR (1 MIN): 9   APGAR (5 MINS): 9   APGAR (10 MINS):   Mom to postpartum.  Baby to Couplet care / Skin to Skin Baby boy "Lindsey Stevenson", parents decline circumcision   Arrie Eastern DNP, CNM 09/26/2021, 2:36 AM

## 2021-04-06 ENCOUNTER — Encounter: Payer: Self-pay | Admitting: Radiology

## 2021-04-06 ENCOUNTER — Ambulatory Visit (INDEPENDENT_AMBULATORY_CARE_PROVIDER_SITE_OTHER): Payer: BC Managed Care – PPO | Admitting: Internal Medicine

## 2021-04-06 ENCOUNTER — Other Ambulatory Visit: Payer: Self-pay

## 2021-04-06 DIAGNOSIS — I471 Supraventricular tachycardia: Secondary | ICD-10-CM

## 2021-04-06 NOTE — Progress Notes (Signed)
Enrolled patient for a 7 day [employer] Event monitor to be mailed to patients home

## 2021-04-06 NOTE — Progress Notes (Signed)
Cardiology Office Note:    Date:  04/06/2021   ID:  [name], DOB [birth year], MRN [id number]  PCP:  Kathyrn Lass, MD   [facility] Providers Cardiologist:  None     Referring MD: Kathyrn Lass, MD   No chief complaint on file. Palpitation  History of Present Illness:    Lindsey Stevenson is a 26 y.o. female with a hx of  G1P2 [redacted] weeks pregnant, uncomplicated delivery, anxiety, referral for palpitations  She states that she went to urgent care and was at work. She was dizzy and felt she would pass out. She found out later that she was pregnant. No syncope. She notes heart racing. She was exercising and her rates went up to 200 beats. She was having anxiety and panic attacks. She notes palpitations with COVID.  She notes recurrence with stress.  Her symptoms are not daily. She has a stressful job. No heart disease history. PGM has atrial fibrillation. No coronary dx family hx. No 1st degree with SCD. She stopped caffeine. No alcohol use. She is only taking the prenatal vitamin. No smoking.Her first pregnancy went well, no issues. 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  TSH 0.74 Crt 0.7  She returns after an ED visit 03/15/2021.  Patient states that this morning she had sudden onset palpitations with associated chest tightness and shortness of breath.  She states that her apple watch told her that her heart rate was approximately 210 bpm.  EMS was called who apparently also confirmed this heart rate.  EMS performed vagal maneuvers with the patient, blowing on a syringe, and the patient self aborted this rhythm. 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 She was recommended to treat with vagal maneuvers while pregnant. Considered AVNRT ablation after delivery. She is 15 weeks.   She has been doing well since she left the ED. She went back to school and has had some stress. She's having headaches. She had a mild episode yesterday of palpitations. She has not had a syncopal  event. It lasted for 20 minutes. She had an echocardiogram that was normal.   Past Medical History:  Diagnosis Date   Anxiety    Depression    Migraine    Vaginal Pap smear, abnormal 2019   HPV    Past Surgical History:  Procedure Laterality Date   NO PAST SURGERIES      Current Medications: Current Meds  Medication Sig   acetaminophen (TYLENOL) 500 MG tablet Take 500 mg by mouth every 6 (six) hours as needed for moderate pain.   Prenatal Vit-Fe Fumarate-FA (PRENATAL MULTIVITAMIN) TABS tablet Take 1 tablet by mouth daily at 12 noon.     Allergies:   Patient has no known allergies.   Social History   Socioeconomic History   Marital status: Married    Spouse name: Kevan Ny   Number of children: Not on file   Years of education: Not on file   Highest education level: Not on file  Occupational History   Not on file  Tobacco Use   Smoking status: Never   Smokeless tobacco: Never  Vaping Use   Vaping Use: Never used  Substance and Sexual Activity   Alcohol use: Not Currently   Drug use: Never   Sexual activity: Yes    Birth control/protection: None  Other Topics Concern   Not on file  Social History Narrative   Not on file   Social Determinants of Health   Financial Resource Strain:  Not on file  Food Insecurity: Not on file  Transportation Needs: Not on file  Physical Activity: Not on file  Stress: Not on file  Social Connections: Not on file     Family History: The patient's family history includes Anxiety disorder in her mother; Arthritis in her maternal grandmother; Asthma in her brother; Cancer in her maternal grandfather and maternal grandmother; Diabetes in her brother; Early death in her sister; Hypertension in her father, mother, and paternal grandmother.  ROS:   Please see the history of present illness.     All other systems reviewed and are negative.  EKGs/Labs/Other Studies Reviewed:    The following studies were reviewed today:   EKG:  EKG  is  ordered today.  The ekg ordered today demonstrates   NSR, no ischemic changes  NSR, PR 172 ms, Qtc 427 ms  Cardiology Studies 03/15/2021:TTE- EF 60-65%. No valve disease, no pulmonary hypertension  Recent Labs: 03/15/2021: ALT 11; BUN 5; Creatinine, Ser 0.56; Hemoglobin 11.4; Platelets 241; Potassium 4.1; Sodium 136  Recent Lipid Panel No results found for: CHOL, TRIG, HDL, CHOLHDL, VLDL, LDLCALC, LDLDIRECT   Risk Assessment/Calculations:           Physical Exam:    VS:  There were no vitals taken for this visit.    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 Due to their infrequent nature and no high risk features we planned to watch it. She presented in December with recurrent symptoms to the ED.  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 Farris Has in the hospital c/f AVNRT. Plan was for vagal maneuvers. She is doing well without recurrence of SVT. Will plan to monitor her with an event monitor. Will also send referral to OB-cardiology for assessment. With recurrence, we can consider BB until delivery.  PLAN:    In order of problems listed above:  Cardiac Monitor 7 days Continue vagal maneuvers Will discuss case with OB-cardiology and EP Follow up in 3 months      Medication Adjustments/Labs and Tests Ordered: Current medicines are reviewed at length with the patient today.  Concerns regarding  medicines are outlined above.    Signed, Janina Mayo, MD  04/06/2021 8:24 AM    [facility]

## 2021-04-06 NOTE — Patient Instructions (Signed)
Medication Instructions:  No Changes In Medications at this time.  *If you need a refill on your cardiac medications before your next appointment, please call your pharmacy*  Testing/Procedures:  [employer] Cardiac Event Monitor Instructions Your physician has requested you wear your cardiac event monitor for 7 days, (1-30). [employer] may call or text to confirm a shipping address. The monitor will be sent to a land address via UPS. [employer] will not ship a monitor to a PO BOX. It typically takes 3-5 days to receive your monitor after it has been enrolled. [employer] will assist with USPS tracking if your package is delayed. The telephone number for [employer] is [phone]. Once you have received your monitor, please review the enclosed instructions. Instruction tutorials can also be viewed under help and settings on the enclosed cell phone. Your monitor has already been registered assigning a specific monitor serial # to you.  Applying the monitor Remove cell phone from case and turn it on. The cell phone works as Dealer and needs to be within [employer] of you at all times. The cell phone will need to be charged on a daily basis. We recommend you plug the cell phone into the enclosed charger at your bedside table every night.  Monitor batteries: You will receive two monitor batteries labelled #1 and #2. These are your recorders. Plug battery #2 onto the second connection on the enclosed charger. Keep one battery on the charger at all times. This will keep the monitor battery deactivated. It will also keep it fully charged for when you need to switch your monitor batteries. A small light will be blinking on the battery emblem when it is charging. The light on the battery emblem will remain on when the battery is fully charged.  Open package of a Monitor strip. Insert battery #1 into black hood on strip and gently squeeze monitor battery onto connection as indicated in  instruction booklet. Set aside while preparing skin.  Choose location for your strip, vertical or horizontal, as indicated in the instruction booklet. Shave to remove all hair from location. There cannot be any lotions, oils, powders, or colognes on skin where monitor is to be applied. Wipe skin clean with enclosed Saline wipe. Dry skin completely.  Peel paper labeled #1 off the back of the Monitor strip exposing the adhesive. Place the monitor on the chest in the vertical or horizontal position shown in the instruction booklet. One arrow on the monitor strip must be pointing upward. Carefully remove paper labeled #2, attaching remainder of strip to your skin. Try not to create any folds or wrinkles in the strip as you apply it.  Firmly press and release the circle in the center of the monitor battery. You will hear a small beep. This is turning the monitor battery on. The heart emblem on the monitor battery will light up every 5 seconds if the monitor battery in turned on and connected to the patient securely. Do not push and hold the circle down as this turns the monitor battery off. The cell phone will locate the monitor battery. A screen will appear on the cell phone checking the connection of your monitor strip. This may read poor connection initially but change to good connection within the next minute. Once your monitor accepts the connection you will hear a series of 3 beeps followed by a climbing crescendo of beeps. A screen will appear on the cell phone showing the two monitor strip placement options. Touch the picture that  demonstrates where you applied the monitor strip.  Your monitor strip and battery are waterproof. You are able to shower, bathe, or swim with the monitor on. They just ask you do not submerge deeper than 3 feet underwater. We recommend removing the monitor if you are swimming in a lake, river, or ocean.  Your monitor battery will need to be switched to a fully  charged monitor battery approximately once a week. The cell phone will alert you of an action which needs to be made.  On the cell phone, tap for details to reveal connection status, monitor battery status, and cell phone battery status. The green dots indicates your monitor is in good status. A red dot indicates there is something that needs your attention.  To record a symptom, click the circle on the monitor battery. In 30-60 seconds a list of symptoms will appear on the cell phone. Select your symptom and tap save. Your monitor will record a sustained or significant arrhythmia regardless of you clicking the button. Some patients do not feel the heart rhythm irregularities. [employer] will notify us of any serious or critical events.  Refer to instruction booklet for instructions on switching batteries, changing strips, the Do not disturb or Pause features, or any additional questions.  Call [employer] at [phone], to confirm your monitor is transmitting and record your baseline. They will answer any questions you may have regarding the monitor instructions at that time.  Returning the monitor to [place] all equipment back into blue box. Peel off strip of paper to expose adhesive and close box securely. There is a prepaid UPS shipping label on this box. Drop in a UPS drop box, or at a UPS facility like [employer]. You may also contact [employer] to arrange UPS to pick up monitor package at your home.  Follow-Up: At [facility], you and your health needs are our priority.  As part of our continuing mission to provide you with exceptional heart care, we have created designated Provider Care Teams.  These Care Teams include your primary Cardiologist (physician) and Advanced Practice Providers (APPs -  Physician Assistants and Nurse Practitioners) who all work together to provide you with the care you need, when you need it.  Your next appointment:   3 month(s)  The format  for your next appointment:   In Person  Provider:   Janina Mayo, MD

## 2021-04-09 ENCOUNTER — Other Ambulatory Visit: Payer: Self-pay | Admitting: Internal Medicine

## 2021-04-09 ENCOUNTER — Encounter: Payer: Self-pay | Admitting: Internal Medicine

## 2021-04-09 DIAGNOSIS — I471 Supraventricular tachycardia: Secondary | ICD-10-CM

## 2021-04-09 MED ORDER — METOPROLOL SUCCINATE ER 25 MG PO TB24: 25.0000 mg | ORAL_TABLET | Freq: Every day | ORAL | 2 refills | Status: DC

## 2021-04-12 ENCOUNTER — Ambulatory Visit (INDEPENDENT_AMBULATORY_CARE_PROVIDER_SITE_OTHER): Payer: BC Managed Care – PPO

## 2021-04-12 DIAGNOSIS — I471 Supraventricular tachycardia: Secondary | ICD-10-CM

## 2021-04-13 ENCOUNTER — Telehealth: Payer: Self-pay | Admitting: Internal Medicine

## 2021-04-13 NOTE — Telephone Encounter (Signed)
New message   Pt would like call to discuss if she should be taking the medication she was given while she wears the monitor. She would also like to know when she will return the monitor. Please call.

## 2021-04-13 NOTE — Telephone Encounter (Signed)
Patient calling to see how long she needs to wear her heart monitor. Advised patient that monitor was to be worn for 7 days. Patient also has not started the metoprolol yet but states that she will. Patient also scheduled to see EP on January 30th, patient will keep this appointment. Will forward to MD to make aware.   Advised patient to call back to office with any issues, questions, or concerns. Patient verbalized understanding.

## 2021-04-16 NOTE — Addendum Note (Signed)
Addended by: Orma Render on: 04/16/2021 12:30 PM   Modules accepted: Orders

## 2021-04-30 ENCOUNTER — Ambulatory Visit (INDEPENDENT_AMBULATORY_CARE_PROVIDER_SITE_OTHER): Payer: BC Managed Care – PPO | Admitting: Internal Medicine

## 2021-04-30 ENCOUNTER — Other Ambulatory Visit: Payer: Self-pay

## 2021-04-30 ENCOUNTER — Encounter: Payer: Self-pay | Admitting: Internal Medicine

## 2021-04-30 VITALS — BP 118/72 | HR 82 | Ht 67.0 in | Wt 186.4 lb

## 2021-04-30 DIAGNOSIS — I471 Supraventricular tachycardia: Secondary | ICD-10-CM | POA: Diagnosis not present

## 2021-04-30 NOTE — Patient Instructions (Addendum)
Medication Instructions:  Your physician recommends that you continue on your current medications as directed. Please refer to the Current Medication list given to you today. *If you need a refill on your cardiac medications before your next appointment, please call your pharmacy*  Lab Work: None. If you have labs (blood work) drawn today and your tests are completely normal, you will receive your results only by: [place] (if you have MyChart) OR A paper copy in the mail If you have any lab test that is abnormal or we need to change your treatment, we will call you to review the results.  Testing/Procedures: None.  Follow-Up: At [facility], you and your health needs are our priority.  As part of our continuing mission to provide you with exceptional heart care, we have created designated Provider Care Teams.  These Care Teams include your primary Cardiologist (physician) and Advanced Practice Providers (APPs -  Physician Assistants and Nurse Practitioners) who all work together to provide you with the care you need, when you need it.  Your physician wants you to follow-up in: 6 months with Lindsey Peru, MD    You will receive a reminder letter in the mail two months in advance. If you don't receive a letter, please call our office to schedule the follow-up appointment.  We recommend signing up for the patient portal called "MyChart".  Sign up information is provided on this After Visit Summary.  MyChart is used to connect with patients for Virtual Visits (Telemedicine).  Patients are able to view lab/test results, encounter notes, upcoming appointments, etc.  Non-urgent messages can be sent to your provider as well.   To learn more about what you can do with MyChart, go to [email or web address].    Any Other Special Instructions Will Be Listed Below (If Applicable).

## 2021-04-30 NOTE — Progress Notes (Signed)
HPI Lindsey Stevenson is referred today by Dr. Harl Bowie for evaluation of SVT.  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  She is otherwise healthy.  The patient experienced an episode of palpitations approximately 2-1/2 years ago and she was found to have SVT.  She recently had a recurrent episode of sustained tachypalpitations and an EKG obtained by paramedics demonstrated a narrow QRS tachycardia at 200 bpm which terminated with vagal maneuvers.  She denies syncope.  She has some dizziness.  No chest pain or shortness of breath.  She has been placed on low-dose Toprol.  She is currently [redacted] weeks gestation. No Known Allergies   Current Outpatient Medications  Medication Sig Dispense Refill   acetaminophen (TYLENOL) 500 MG tablet Take 500 mg by mouth every 6 (six) hours as needed for moderate pain.     Butalbital-APAP-Caffeine 50-300-40 MG CAPS Take by mouth.     ibuprofen (ADVIL) 600 MG tablet Take 1 tablet (600 mg total) by mouth every 6 (six) hours. 30 tablet 0   metoprolol succinate (TOPROL XL) 25 MG 24 hr tablet Take 1 tablet (25 mg total) by mouth daily. 30 tablet 2   Prenatal Vit-Fe Fumarate-FA (PRENATAL MULTIVITAMIN) TABS tablet Take 1 tablet by mouth daily at 12 noon.     No current facility-administered medications for this visit.     Past Medical History:  Diagnosis Date   Anxiety    Depression    Migraine    Vaginal Pap smear, abnormal 2019   HPV    ROS:   All systems reviewed and negative except as noted in the HPI.   Past Surgical History:  Procedure Laterality Date   NO PAST SURGERIES       Family History  Problem Relation Age of Onset   Cancer Maternal Grandmother    Arthritis Maternal Grandmother    Cancer Maternal Grandfather    Hypertension Paternal Grandmother    Anxiety disorder Mother    Hypertension Mother    Hypertension Father    Asthma Brother    Diabetes Brother    Early death Sister       Social History   Socioeconomic History   Marital status: Married    Spouse name: Kevan Ny   Number of children: Not on file   Years of education: Not on file   Highest education level: Not on file  Occupational History   Not on file  Tobacco Use   Smoking status: Never   Smokeless tobacco: Never  Vaping Use   Vaping Use: Never used  Substance and Sexual Activity   Alcohol use: Not Currently   Drug use: Never   Sexual activity: Yes    Birth control/protection: None  Other Topics Concern   Not on file  Social History Narrative   Not on file   Social Determinants of Health   Financial Resource Strain: Not on file  Food Insecurity: Not on file  Transportation Needs: Not on file  Physical Activity: Not on file  Stress: Not on file  Social Connections: Not on file  Intimate Partner Violence: Not on file     BP 118/72    Pulse 82    Ht 5\' 7"  (1.702 m)    Wt 186 lb 6.4 oz (84.6 kg)    SpO2 98%    BMI 29.19 kg/m   Physical Exam:  Well appearing NAD HEENT: Unremarkable Neck:  No JVD, no  thyromegally Lymphatics:  No adenopathy Back:  No CVA tenderness Lungs:  Clear, with no wheezes, rales, or rhonchi HEART:  Regular rate rhythm, no murmurs, no rubs, no clicks Abd:  soft, positive bowel sounds, no organomegally, no rebound, no guarding, gravid Ext:  2 plus pulses, no edema, no cyanosis, no clubbing Skin:  No rashes no nodules Neuro:  CN II through XII intact, motor grossly intact  EKG -reviewed.  Normal sinus rhythm with no ventricular preexcitation  Assess/Plan:  1.  Recurrent SVT -she appears to be tolerating her beta-blocker nicely and has had no recurrent SVT since starting her beta-blocker.  She is in her second trimester pregnancy.  I would recommend that she discontinue the Toprol 2 days prior to her scheduled delivery.  Following delivery, if she is not going to breast-feed Toprol could be restarted.  We discussed EP study and catheter ablation after  delivery.  I plan to see her back in approximately 6 months.  We discussed the importance of avoiding caffeine and alcohol.  She is consuming neither.  Cristopher Peru, MD

## 2021-05-11 ENCOUNTER — Other Ambulatory Visit: Payer: Self-pay

## 2021-05-11 ENCOUNTER — Ambulatory Visit (INDEPENDENT_AMBULATORY_CARE_PROVIDER_SITE_OTHER): Payer: BC Managed Care – PPO | Admitting: Cardiology

## 2021-05-11 ENCOUNTER — Encounter: Payer: Self-pay | Admitting: Cardiology

## 2021-05-11 VITALS — BP 110/66 | HR 92 | Ht 67.0 in | Wt 188.3 lb

## 2021-05-11 DIAGNOSIS — I471 Supraventricular tachycardia: Secondary | ICD-10-CM

## 2021-05-11 DIAGNOSIS — Z719 Counseling, unspecified: Secondary | ICD-10-CM

## 2021-05-11 NOTE — Progress Notes (Signed)
Cardio-Obstetrics Clinic  New Evaluation  Date:  05/12/2021   ID:  Lindsey Stevenson, DOB 01/19/1996, MRN [id number]  PCP:  Kathyrn Lass, MD   [facility] Providers Cardiologist:  Janina Mayo, MD  Electrophysiologist:  None       Referring MD: Janina Mayo, MD   Chief Complaint: Skipped heartbeats  History of Present Illness:    Lindsey Stevenson is a 26 y.o. 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   Patient reports that she went to the emergency department on 03/15/2021 for heart palpitations and chest tightness.  She was found to be in SVT which resolved with vagal maneuvers.  She had an echo completed which was reassuring.  She then saw cardiology on 1/6 who set her up with a Zio patch.  They also prescribed metoprolol 25 mg daily discussed use of vagal maneuvers if these episodes return.  She was referred to [facility] cardiology as well as EP.  She was seen by Dr. Lovena Le on 1/30 and recommended continuation of metoprolol and plan for EP study and possible catheter ablation after delivery.  He plans to see her back in approximately 6 months.  Also recommended decreasing caffeine and alcohol consumption which she does neither.  Patient reports that since her initial visit to the emergency department she has not had any sustained episodes of SVT that she knows of.  She acknowledges that she occasionally feels when her heart "skips a beat".  She has not taken the metoprolol because she has essentially not had a any symptoms.  We discussed the need for the medication and patient would prefer not to take it if she does not absolutely have to.  Prior CV Studies Reviewed: The following studies were reviewed today:  Cardiac event monitor-Predominantly sinus rhythm with PACs. No significant tachyarrhythmia or bradyarrhythmia. No atrial fibrillation or flutter.  Echocardiogram 03/15/2021  IMPRESSIONS     1.  Left ventricular ejection fraction, by estimation, is 60 to 65%. The  left ventricle has normal function. The left ventricle has no regional  wall motion abnormalities. Left ventricular diastolic parameters were  normal.   2. Right ventricular systolic function is normal. The right ventricular  size is normal. There is normal pulmonary artery systolic pressure.   3. Left atrial size was moderately dilated.   4. The mitral valve is normal in structure. Trivial mitral valve  regurgitation. No evidence of mitral stenosis.   5. The aortic valve is tricuspid. Aortic valve regurgitation is not  visualized. No aortic stenosis is present.   6. The inferior vena cava is normal in size with greater than 50%  respiratory variability, suggesting right atrial pressure of 3 mmHg.  Past Medical History:  Diagnosis Date   Anxiety    Depression    Migraine    Vaginal Pap smear, abnormal 2019   HPV    Past Surgical History:  Procedure Laterality Date   NO PAST SURGERIES        OB History     Gravida  2   Para  1   Term  1   Preterm      AB      Living  1      SAB      IAB      Ectopic      Multiple  0   Live Births  1  Current Medications: Current Meds  Medication Sig   acetaminophen (TYLENOL) 500 MG tablet Take 500 mg by mouth every 6 (six) hours as needed for moderate pain.   Prenatal Vit-Fe Fumarate-FA (PRENATAL MULTIVITAMIN) TABS tablet Take 1 tablet by mouth daily at 12 noon.     Allergies:   Patient has no known allergies.   Social History   Socioeconomic History   Marital status: Married    Spouse name: Kevan Ny   Number of children: Not on file   Years of education: Not on file   Highest education level: Not on file  Occupational History   Not on file  Tobacco Use   Smoking status: Never   Smokeless tobacco: Never  Vaping Use   Vaping Use: Never used  Substance and Sexual Activity   Alcohol use: Not Currently   Drug use: Never    Sexual activity: Yes    Birth control/protection: None  Other Topics Concern   Not on file  Social History Narrative   Not on file   Social Determinants of Health   Financial Resource Strain: Not on file  Food Insecurity: Not on file  Transportation Needs: Not on file  Physical Activity: Not on file  Stress: Not on file  Social Connections: Not on file      Family History  Problem Relation Age of Onset   Cancer Maternal Grandmother    Arthritis Maternal Grandmother    Cancer Maternal Grandfather    Hypertension Paternal Grandmother    Anxiety disorder Mother    Hypertension Mother    Hypertension Father    Asthma Brother    Diabetes Brother    Early death Sister       ROS:   Please see the history of present illness.    Review of Systems  Constitutional:  Negative for chills and fever.  Eyes:  Negative for blurred vision.  Respiratory:  Negative for shortness of breath.   Cardiovascular:  Positive for palpitations and leg swelling. Negative for chest pain.  Neurological:  Negative for dizziness, sensory change and headaches.   All other systems reviewed and are negative.   Labs/EKG Reviewed:   EKG: Normal sinus rhythm   Recent Labs: 03/15/2021: ALT 11; BUN 5; Creatinine, Ser 0.56; Hemoglobin 11.4; Platelets 241; Potassium 4.1; Sodium 136   Recent Lipid Panel No results found for: CHOL, TRIG, HDL, CHOLHDL, LDLCALC, LDLDIRECT  Physical Exam:    VS:  BP 110/66    Pulse 92    Ht 5\' 7"  (1.702 m)    Wt 188 lb 4.8 oz (85.4 kg)    LMP 12/21/2020    SpO2 98%    BMI 29.49 kg/m     Wt Readings from Last 3 Encounters:  05/11/21 188 lb 4.8 oz (85.4 kg)  04/30/21 186 lb 6.4 oz (84.6 kg)  03/15/21 171 lb 4.8 oz (77.7 kg)     GEN:  Well nourished, well developed in no acute distress HEENT: Normal NECK: No JVD; No carotid bruits LYMPHATICS: No lymphadenopathy CARDIAC: RRR, no rubs, gallops, physiologic murmur of pregnancy noticed RESPIRATORY:  Clear to auscultation  without rales, wheezing or rhonchi  ABDOMEN: Soft, non-tender, non-distended MUSCULOSKELETAL:  No edema; No deformity  SKIN: Warm and dry NEUROLOGIC:  Alert and oriented x 3 PSYCHIATRIC:  Normal affect    Risk Assessment/Risk Calculators:     CARPREG II Risk Prediction Index Score:  4.  The patient's risk for a primary cardiac event is 22%.  ASSESSMENT & PLAN:   Lulabelle Desta is a 26 y.o. 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  Patient has not had recurrent symptoms since being evaluated in the hospital for the prolonged episode of SVT.  She has seen her cardiologist as well as an EP provider.  I plan on EP study and possible ablation after her pregnancy.  Regarding management today she is hesitant to start the metoprolol because of lack of symptoms at this time.  We discussed that if her symptoms progress or become more bothersome she can initiate the metoprolol 25 mg daily and if she needs refills she can call our clinic for those refills.    We plan to follow-up with her in approximately 12 weeks to assess her symptoms and determine if there is any need for changes prior to delivery.  If she has any other cardiac symptoms she can also call our clinic and schedule follow-up appointment sooner.  Patient Instructions  Medication Instructions:  Your physician recommends that you continue on your current medications as directed. Please refer to the Current Medication list given to you today.  *If you need a refill on your cardiac medications before your next appointment, please call your pharmacy*   Lab Work: None. If you have labs (blood work) drawn today and your tests are completely normal, you will receive your results only by: [place] (if you have MyChart) OR A paper copy in the mail If you have any lab test that is abnormal or we need to change your treatment, we will call you to review the  results.   Testing/Procedures: None.   Follow-Up: At [facility], you and your health needs are our priority.  As part of our continuing mission to provide you with exceptional heart care, we have created designated Provider Care Teams.  These Care Teams include your primary Cardiologist (physician) and Advanced Practice Providers (APPs -  Physician Assistants and Nurse Practitioners) who all work together to provide you with the care you need, when you need it.  We recommend signing up for the patient portal called "MyChart".  Sign up information is provided on this After Visit Summary.  MyChart is used to connect with patients for Virtual Visits (Telemedicine).  Patients are able to view lab/test results, encounter notes, upcoming appointments, etc.  Non-urgent messages can be sent to your provider as well.   To learn more about what you can do with MyChart, go to [email or web address].    Your next appointment:   12 week(s)  The format for your next appointment:   Virtual Visit   Provider:   Berniece Salines, DO    Other Instructions     Dispo:  Return in about 13 weeks (around 08/10/2021).   Medication Adjustments/Labs and Tests Ordered: Current medicines are reviewed at length with the patient today.  Concerns regarding medicines are outlined above.  Tests Ordered: Orders Placed This Encounter  Procedures   EKG 12-Lead   Medication Changes: No orders of the defined types were placed in this encounter.

## 2021-05-11 NOTE — Patient Instructions (Signed)
Medication Instructions:  Your physician recommends that you continue on your current medications as directed. Please refer to the Current Medication list given to you today.  *If you need a refill on your cardiac medications before your next appointment, please call your pharmacy*   Lab Work: None. If you have labs (blood work) drawn today and your tests are completely normal, you will receive your results only by: [place] (if you have MyChart) OR A paper copy in the mail If you have any lab test that is abnormal or we need to change your treatment, we will call you to review the results.   Testing/Procedures: None.   Follow-Up: At [facility], you and your health needs are our priority.  As part of our continuing mission to provide you with exceptional heart care, we have created designated Provider Care Teams.  These Care Teams include your primary Cardiologist (physician) and Advanced Practice Providers (APPs -  Physician Assistants and Nurse Practitioners) who all work together to provide you with the care you need, when you need it.  We recommend signing up for the patient portal called "MyChart".  Sign up information is provided on this After Visit Summary.  MyChart is used to connect with patients for Virtual Visits (Telemedicine).  Patients are able to view lab/test results, encounter notes, upcoming appointments, etc.  Non-urgent messages can be sent to your provider as well.   To learn more about what you can do with MyChart, go to [email or web address].    Your next appointment:   12 week(s)  The format for your next appointment:   Virtual Visit   Provider:   Berniece Salines, DO    Other Instructions

## 2021-05-13 DIAGNOSIS — I471 Supraventricular tachycardia: Secondary | ICD-10-CM | POA: Insufficient documentation

## 2021-05-13 DIAGNOSIS — Z719 Counseling, unspecified: Secondary | ICD-10-CM | POA: Insufficient documentation

## 2021-05-18 ENCOUNTER — Inpatient Hospital Stay (HOSPITAL_COMMUNITY)
Admission: AD | Admit: 2021-05-18 | Discharge: 2021-05-18 | Disposition: A | Discharge status: Home / Self Care | Payer: BC Managed Care – PPO | Attending: Obstetrics and Gynecology | Admitting: Obstetrics and Gynecology

## 2021-05-18 ENCOUNTER — Encounter (HOSPITAL_COMMUNITY): Payer: Self-pay | Admitting: *Deleted

## 2021-05-18 DIAGNOSIS — R42 Dizziness and giddiness: Secondary | ICD-10-CM | POA: Diagnosis not present

## 2021-05-18 DIAGNOSIS — O26892 Other specified pregnancy related conditions, second trimester: Secondary | ICD-10-CM | POA: Diagnosis not present

## 2021-05-18 DIAGNOSIS — R519 Headache, unspecified: Secondary | ICD-10-CM | POA: Diagnosis not present

## 2021-05-18 DIAGNOSIS — Z3A21 21 weeks gestation of pregnancy: Secondary | ICD-10-CM | POA: Insufficient documentation

## 2021-05-18 HISTORY — DX: Supraventricular tachycardia, unspecified: I47.10

## 2021-05-18 HISTORY — DX: Supraventricular tachycardia: I47.1

## 2021-05-18 LAB — URINALYSIS, ROUTINE W REFLEX MICROSCOPIC
Bilirubin Urine: NEGATIVE
Glucose, UA: NEGATIVE mg/dL
Hgb urine dipstick: NEGATIVE
Ketones, ur: 5 mg/dL — AB
Leukocytes,Ua: NEGATIVE
Nitrite: NEGATIVE
Protein, ur: NEGATIVE mg/dL
Specific Gravity, Urine: 1.01 (ref 1.005–1.030)
pH: 7 (ref 5.0–8.0)

## 2021-05-18 MED ORDER — IBUPROFEN 800 MG PO TABS
800.0000 mg | ORAL_TABLET | Freq: Once | ORAL | Status: AC
Start: 2021-05-18 — End: 2021-05-18
  Administered 2021-05-18: 800 mg via ORAL
  Filled 2021-05-18: qty 1

## 2021-05-18 MED ORDER — LACTATED RINGERS IV BOLUS
1000.0000 mL | Freq: Once | INTRAVENOUS | Status: AC
Start: 2021-05-18 — End: 2021-05-18
  Administered 2021-05-18: 1000 mL via INTRAVENOUS

## 2021-05-18 MED ORDER — PROMETHAZINE HCL 25 MG PO TABS: 25.0000 mg | ORAL_TABLET | Freq: Four times a day (QID) | ORAL | 0 refills | Status: DC | PRN

## 2021-05-18 MED ORDER — CYCLOBENZAPRINE HCL 10 MG PO TABS: 10.0000 mg | ORAL_TABLET | Freq: Two times a day (BID) | ORAL | 0 refills | Status: DC | PRN

## 2021-05-18 MED ORDER — SODIUM CHLORIDE 0.9 % IV SOLN
25.0000 mg | Freq: Once | INTRAVENOUS | Status: AC
  Administered 2021-05-18: 25 mg via INTRAVENOUS
  Filled 2021-05-18: qty 1

## 2021-05-18 MED ORDER — CAFFEINE-SODIUM BENZOATE 125-125 MG/ML IJ SOLN: 500.0000 mg | Freq: Once | INTRAMUSCULAR | Status: DC

## 2021-05-18 NOTE — MAU Note (Signed)
Pt stated she had an "orah" in her right eye. Felt a little dizzy and had some numbness in the right side of her cheek.Had someone at work take her b/p and it was 147/95. Pt stated she does not have b/p that high before.  Pt is c/o headache now still sees a little bit of the orah.

## 2021-05-18 NOTE — MAU Provider Note (Signed)
History     CSN: [id number]  Arrival date and time: 05/18/21 1435   Event Date/Time   First Provider Initiated Contact with Patient 05/18/21 1517      Chief Complaint  Patient presents with   Hypertension   HPI Ms. Margi Edmundson is a 26 y.o. year old G53P1001 female at [redacted]w[redacted]d weeks gestation who presents to MAU reporting an aura in her RT eye started this morning. She then became dizzy and had some numbness in her RT cheek and down her RT arm; none now. Someone at her job took her BP; 147/95 and repeated at 135/90. She has never had any BP issues before today.   OB History     Gravida  2   Para  1   Term  1   Preterm      AB      Living  1      SAB      IAB      Ectopic      Multiple  0   Live Births  1           Past Medical History:  Diagnosis Date   Anxiety    Depression    Migraine    SVT (supraventricular tachycardia) (HCC)    Vaginal Pap smear, abnormal 2019   HPV    Past Surgical History:  Procedure Laterality Date   NO PAST SURGERIES      Family History  Problem Relation Age of Onset   Cancer Maternal Grandmother    Arthritis Maternal Grandmother    Cancer Maternal Grandfather    Hypertension Paternal Grandmother    Anxiety disorder Mother    Hypertension Mother    Hypertension Father    Asthma Brother    Diabetes Brother    Early death Sister     Social History   Tobacco Use   Smoking status: Never   Smokeless tobacco: Never  Vaping Use   Vaping Use: Never used  Substance Use Topics   Alcohol use: Not Currently   Drug use: Never    Allergies: No Known Allergies  No medications prior to admission.    Review of Systems  Constitutional: Negative.   HENT: Negative.    Eyes: Negative.   Respiratory: Negative.    Cardiovascular: Negative.   Gastrointestinal: Negative.   Endocrine: Negative.   Genitourinary: Negative.   Musculoskeletal: Negative.   Skin: Negative.   Allergic/Immunologic: Negative.    Neurological:  Positive for headaches (concentrated over LT eye). Negative for numbness (RT cheek, down RT arm).  Hematological: Negative.   Psychiatric/Behavioral: Negative.    Physical Exam   Blood pressure 114/69, pulse 72, temperature 98.4 F (36.9 C), resp. rate 15, height 5\' 7"  (1.702 m), last menstrual period 12/21/2020, SpO2 100 %, unknown if currently breastfeeding.  Physical Exam Vitals and nursing note reviewed.  Constitutional:      Appearance: Normal appearance. She is obese.  Cardiovascular:     Rate and Rhythm: Normal rate.  Pulmonary:     Effort: Pulmonary effort is normal.  Abdominal:     Palpations: Abdomen is soft.  Neurological:     General: No focal deficit present.     Mental Status: She is alert and oriented to person, place, and time.     Cranial Nerves: Cranial nerves 2-12 are intact.     Sensory: Sensation is intact.     Motor: Motor function is intact. No weakness (none now).     Coordination:  Coordination is intact.     Comments: Strong and equal grip bilaterally  Psychiatric:        Mood and Affect: Mood normal.        Behavior: Behavior normal.        Thought Content: Thought content normal.        Judgment: Judgment normal.   FHTs by doppler: 143 bpm  MAU Course  Procedures  MDM CCUA LR bolus 1000 ml @ 999 ml/hr Phenergan 25 mg IVPB Ibuprofen 800 mg po  Reassessment @ 1750: Headache resolved, patient sleeping and ready to go home.  Results for orders placed or performed during the hospital encounter of 05/18/21 (from the past 24 hour(s))  Urinalysis, Routine w reflex microscopic Urine, Clean Catch     Status: Abnormal   Collection Time: 05/18/21  3:37 PM  Result Value Ref Range   Color, Urine YELLOW YELLOW   APPearance HAZY (A) CLEAR   Specific Gravity, Urine 1.010 1.005 - 1.030   pH 7.0 5.0 - 8.0   Glucose, UA NEGATIVE NEGATIVE mg/dL   Hgb urine dipstick NEGATIVE NEGATIVE   Bilirubin Urine NEGATIVE NEGATIVE   Ketones, ur 5 (A)  NEGATIVE mg/dL   Protein, ur NEGATIVE NEGATIVE mg/dL   Nitrite NEGATIVE NEGATIVE   Leukocytes,Ua NEGATIVE NEGATIVE    Assessment and Plan  Headache in pregnancy, antepartum, second trimester  - Rx for Flexeril 10 mg BID prn H/A - Advised if H/A not improved after Flexeril, may take Tylenol and add Phenergan prn N/V - Information provided on chronic migraine and a H/A record   [redacted] weeks gestation of pregnancy   - Discharge patient - Keep scheduled appt with [facility] in March - Patient verbalized an understanding of the plan of care and agrees.    Laury Deep, CNM 05/18/2021, 3:20 PM

## 2021-07-03 ENCOUNTER — Ambulatory Visit: Payer: BC Managed Care – PPO | Admitting: Internal Medicine

## 2021-07-09 ENCOUNTER — Encounter: Payer: Self-pay | Admitting: Internal Medicine

## 2021-08-03 ENCOUNTER — Telehealth (INDEPENDENT_AMBULATORY_CARE_PROVIDER_SITE_OTHER): Payer: BC Managed Care – PPO | Admitting: Cardiology

## 2021-08-03 ENCOUNTER — Encounter: Payer: Self-pay | Admitting: Cardiology

## 2021-08-03 VITALS — Ht 67.0 in | Wt 209.0 lb

## 2021-08-03 DIAGNOSIS — I471 Supraventricular tachycardia: Secondary | ICD-10-CM | POA: Diagnosis not present

## 2021-08-03 NOTE — Patient Instructions (Signed)
Medication Instructions:  ?Your physician recommends that you continue on your current medications as directed. Please refer to the Current Medication list given to you today.  ? ?Please take your blood pressure daily for 2 weeks and send in a MyChart message. Please include heart rates.  ? ?HOW TO TAKE YOUR BLOOD PRESSURE: ?Rest 5 minutes before taking your blood pressure. ?Don?t smoke or drink caffeinated beverages for at least 30 minutes before. ?Take your blood pressure before (not after) you eat. ?Sit comfortably with your back supported and both feet on the floor (don?t cross your legs). ?Elevate your arm to heart level on a table or a desk. ?Use the proper sized cuff. It should fit smoothly and snugly around your bare upper arm. There should be enough room to slip a fingertip under the cuff. The bottom edge of the cuff should be 1 inch above the crease of the elbow. ?Ideally, take 3 measurements at one sitting and record the average. ? ?*If you need a refill on your cardiac medications before your next appointment, please call your pharmacy* ? ? ?Lab Work: ?None ?If you have labs (blood work) drawn today and your tests are completely normal, you will receive your results only by: ?MyChart Message (if you have MyChart) OR ?A paper copy in the mail ?If you have any lab test that is abnormal or we need to change your treatment, we will call you to review the results. ? ? ?Testing/Procedures: ?None ? ? ?Follow-Up: ?At [facility], you and your health needs are our priority.  As part of our continuing mission to provide you with exceptional heart care, we have created designated Provider Care Teams.  These Care Teams include your primary Cardiologist (physician) and Advanced Practice Providers (APPs -  Physician Assistants and Nurse Practitioners) who all work together to provide you with the care you need, when you need it. ? ?We recommend signing up for the patient portal called "MyChart".  Sign up  information is provided on this After Visit Summary.  MyChart is used to connect with patients for Virtual Visits (Telemedicine).  Patients are able to view lab/test results, encounter notes, upcoming appointments, etc.  Non-urgent messages can be sent to your provider as well.   ?To learn more about what you can do with MyChart, go to [email or web address].   ? ?Your next appointment:   ?12 week(s) ? ?The format for your next appointment:   ?Virtual Visit  ? ?Provider:   ?Berniece Salines, DO ? ? ?Other Instructions ? ? ?Important Information About Sugar ? ? ? ? ?  ?

## 2021-08-03 NOTE — Progress Notes (Addendum)
[facility]  Follow Up Note   Date:  08/03/2021   ID:  Lindsey Stevenson, DOB 03/13/1996, MRN [id number]  PCP:  Kathyrn Lass, MD   [place] [facility] Cardiologist:  Janina Mayo, MD  Electrophysiologist:  None       The patient is at home. I am in the office.  Virtual Visit via Video  Note . I connected with the patient today by a   video enabled telemedicine application and verified that I am speaking with the correct person using two identifiers.   Referring MD: Kathyrn Lass, MD   Chief Complaint: " I am having shortness of breath"  The patient is home. I am in the office.  Virtual Visit via Video  Note . I connected with the patient today by a   video enabled telemedicine application and verified that I am speaking with the correct person using two identifiers.   History of Present Illness:    Lindsey Stevenson is a 26 y.o. female [G2P1001] who returns for follow up of supraventricular tachycardia in pregnancy.  History of paroxysmal SVT, obesity here today for follow-up visit.  I saw the patient on May 11, 2021 at that time she had been started on metoprolol but preferred not to take this medication.  She has had some palpitations.  During her visit we talked about starting her on the low-dose beta-blocker if the symptoms get worse. She is having palpitations but she tells me is tolerable.  She also has shortness of breath.   Prior CV Studies Reviewed: The following studies were reviewed today:  Echocardiogram 03/15/2021 IMPRESSIONS   1. Left ventricular ejection fraction, by estimation, is 60 to 65%. The left ventricle has normal function. The left ventricle has no regional  wall motion abnormalities. Left ventricular diastolic parameters were normal.   2. Right ventricular systolic function is normal. The right ventricular size is normal. There is normal pulmonary artery systolic pressure.   3. Left atrial size was moderately dilated.   4.  The mitral valve is normal in structure. Trivial mitral valve regurgitation. No evidence of mitral stenosis.   5. The aortic valve is tricuspid. Aortic valve regurgitation is not visualized. No aortic stenosis is present.   6. The inferior vena cava is normal in size with greater than 50%  respiratory variability, suggesting right atrial pressure of 3 mmHg.   Past Medical History:  Diagnosis Date   Anxiety    Depression    Migraine    SVT (supraventricular tachycardia) (HCC)    Vaginal Pap smear, abnormal 2019   HPV    Past Surgical History:  Procedure Laterality Date   NO PAST SURGERIES        OB History     Gravida  2   Para  1   Term  1   Preterm      AB      Living  1      SAB      IAB      Ectopic      Multiple  0   Live Births  1               Current Medications: Current Meds  Medication Sig   acetaminophen (TYLENOL) 500 MG tablet Take 500 mg by mouth every 6 (six) hours as needed for moderate pain.   metoprolol succinate (TOPROL-XL) 25 MG 24 hr tablet Take 25 mg by mouth daily.   Prenatal Vit-Fe Fumarate-FA (PRENATAL MULTIVITAMIN) TABS tablet  Take 1 tablet by mouth daily at 12 noon.     Allergies:   Patient has no known allergies.   Social History   Socioeconomic History   Marital status: Married    Spouse name: Kevan Ny   Number of children: Not on file   Years of education: Not on file   Highest education level: Not on file  Occupational History   Not on file  Tobacco Use   Smoking status: Never   Smokeless tobacco: Never  Vaping Use   Vaping Use: Never used  Substance and Sexual Activity   Alcohol use: Not Currently   Drug use: Never   Sexual activity: Yes    Birth control/protection: None  Other Topics Concern   Not on file  Social History Narrative   Not on file   Social Determinants of Health   Financial Resource Strain: Not on file  Food Insecurity: Not on file  Transportation Needs: Not on file  Physical  Activity: Not on file  Stress: Not on file  Social Connections: Not on file      Family History  Problem Relation Age of Onset   Cancer Maternal Grandmother    Arthritis Maternal Grandmother    Cancer Maternal Grandfather    Hypertension Paternal Grandmother    Anxiety disorder Mother    Hypertension Mother    Hypertension Father    Asthma Brother    Diabetes Brother    Early death Sister       ROS:   Review of Systems  Constitution: Negative for decreased appetite, fever and weight gain.  HENT: Negative for congestion, ear discharge, hoarse voice and sore throat.   Eyes: Negative for discharge, redness, vision loss in right eye and visual halos.  Cardiovascular: Reports palpitations.  Negative for chest pain, dyspnea on exertion, leg swelling, orthopnea. Respiratory: Negative for cough, hemoptysis, shortness of breath and snoring.   Endocrine: Negative for heat intolerance and polyphagia.  Hematologic/Lymphatic: Negative for bleeding problem. Does not bruise/bleed easily.  Skin: Negative for flushing, nail changes, rash and suspicious lesions.  Musculoskeletal: Negative for arthritis, joint pain, muscle cramps, myalgias, neck pain and stiffness.  Gastrointestinal: Negative for abdominal pain, bowel incontinence, diarrhea and excessive appetite.  Genitourinary: Negative for decreased libido, genital sores and incomplete emptying.  Neurological: Negative for brief paralysis, focal weakness, headaches and loss of balance.  Psychiatric/Behavioral: Negative for altered mental status, depression and suicidal ideas.  Allergic/Immunologic: Negative for HIV exposure and persistent infections.     Labs/EKG Reviewed:    EKG:   EKG is was not ordered today.    Recent Labs: 03/15/2021: ALT 11; BUN 5; Creatinine, Ser 0.56; Hemoglobin 11.4; Platelets 241; Potassium 4.1; Sodium 136   Recent Lipid Panel No results found for: CHOL, TRIG, HDL, CHOLHDL, LDLCALC, LDLDIRECT  Physical  Exam:    VS:  Ht '5\' 7"'$  (1.702 m)   Wt 209 lb (94.8 kg)   LMP 12/21/2020   BMI 32.73 kg/m     Wt Readings from Last 3 Encounters:  08/03/21 209 lb (94.8 kg)  05/11/21 188 lb 4.8 oz (85.4 kg)  04/30/21 186 lb 6.4 oz (84.6 kg)     GEN:  Well nourished, well developed in no acute distress HEENT: Normal NECK: No JVD; No carotid bruits LYMPHATICS: No lymphadenopathy CARDIAC: RRR, no murmurs, rubs, gallops RESPIRATORY:  Clear to auscultation without rales, wheezing or rhonchi  ABDOMEN: Soft, non-tender, non-distended MUSCULOSKELETAL:  No edema; No deformity  SKIN: Warm and dry NEUROLOGIC:  Alert and oriented x 3 PSYCHIATRIC:  Normal affect    Risk Assessment/Risk Calculators:     CARPREG II Risk Prediction Index Score:  1.  The patient's risk for a primary cardiac event is 5%.            ASSESSMENT & PLAN:    PSVT -she is having intermittent symptoms but tells me is not bothersome to start medications.  We talked about management plan if this gets worse to start her on low-dose. She is, monitor her symptoms.    Patient Instructions  Medication Instructions:  Your physician recommends that you continue on your current medications as directed. Please refer to the Current Medication list given to you today.   Please take your blood pressure daily for 2 weeks and send in a MyChart message. Please include heart rates.   HOW TO TAKE YOUR BLOOD PRESSURE: Rest 5 minutes before taking your blood pressure. Don't smoke or drink caffeinated beverages for at least 30 minutes before. Take your blood pressure before (not after) you eat. Sit comfortably with your back supported and both feet on the floor (don't cross your legs). Elevate your arm to heart level on a table or a desk. Use the proper sized cuff. It should fit smoothly and snugly around your bare upper arm. There should be enough room to slip a fingertip under the cuff. The bottom edge of the cuff should be 1 inch above  the crease of the elbow. Ideally, take 3 measurements at one sitting and record the average.  *If you need a refill on your cardiac medications before your next appointment, please call your pharmacy*   Lab Work: None If you have labs (blood work) drawn today and your tests are completely normal, you will receive your results only by: [place] (if you have MyChart) OR A paper copy in the mail If you have any lab test that is abnormal or we need to change your treatment, we will call you to review the results.   Testing/Procedures: None   Follow-Up: At [facility], you and your health needs are our priority.  As part of our continuing mission to provide you with exceptional heart care, we have created designated Provider Care Teams.  These Care Teams include your primary Cardiologist (physician) and Advanced Practice Providers (APPs -  Physician Assistants and Nurse Practitioners) who all work together to provide you with the care you need, when you need it.  We recommend signing up for the patient portal called "MyChart".  Sign up information is provided on this After Visit Summary.  MyChart is used to connect with patients for Virtual Visits (Telemedicine).  Patients are able to view lab/test results, encounter notes, upcoming appointments, etc.  Non-urgent messages can be sent to your provider as well.   To learn more about what you can do with MyChart, go to [email or web address].    Your next appointment:   12 week(s)  The format for your next appointment:   Virtual Visit   Provider:   Berniece Salines, DO   Other Instructions   Important Information About Sugar         Dispo:  Return in about 12 weeks (around 10/26/2021).   Medication Adjustments/Labs and Tests Ordered: Current medicines are reviewed at length with the patient today.  Concerns regarding medicines are outlined above.  Tests Ordered: No orders of the defined types were placed in this  encounter.  Medication Changes: No orders of the defined types were placed  in this encounter.

## 2021-08-29 LAB — OB RESULTS CONSOLE GC/CHLAMYDIA
Chlamydia: NEGATIVE
Neisseria Gonorrhea: NEGATIVE

## 2021-08-30 ENCOUNTER — Other Ambulatory Visit: Payer: Self-pay | Admitting: Obstetrics and Gynecology

## 2021-08-30 DIAGNOSIS — R2231 Localized swelling, mass and lump, right upper limb: Secondary | ICD-10-CM

## 2021-08-31 LAB — OB RESULTS CONSOLE GBS: GBS: NEGATIVE

## 2021-09-07 ENCOUNTER — Other Ambulatory Visit: Payer: BC Managed Care – PPO

## 2021-09-17 ENCOUNTER — Ambulatory Visit
Admission: RE | Admit: 2021-09-17 | Discharge: 2021-09-17 | Disposition: A | Discharge status: Home / Self Care | Payer: BC Managed Care – PPO | Source: Ambulatory Visit | Attending: Obstetrics and Gynecology | Admitting: Obstetrics and Gynecology

## 2021-09-17 DIAGNOSIS — R2231 Localized swelling, mass and lump, right upper limb: Secondary | ICD-10-CM

## 2021-09-24 ENCOUNTER — Other Ambulatory Visit: Payer: Self-pay

## 2021-09-24 ENCOUNTER — Inpatient Hospital Stay (HOSPITAL_COMMUNITY)
Admission: AD | Admit: 2021-09-24 | Discharge: 2021-09-27 | DRG: 805 | Disposition: A | Discharge status: Home / Self Care | Payer: BC Managed Care – PPO | Attending: Obstetrics and Gynecology | Admitting: Obstetrics and Gynecology

## 2021-09-24 ENCOUNTER — Encounter (HOSPITAL_COMMUNITY): Payer: Self-pay | Admitting: Obstetrics and Gynecology

## 2021-09-24 DIAGNOSIS — O99214 Obesity complicating childbirth: Secondary | ICD-10-CM | POA: Diagnosis present

## 2021-09-24 DIAGNOSIS — I471 Supraventricular tachycardia: Secondary | ICD-10-CM | POA: Diagnosis present

## 2021-09-24 DIAGNOSIS — O4290 Premature rupture of membranes, unspecified as to length of time between rupture and onset of labor, unspecified weeks of gestation: Principal | ICD-10-CM | POA: Diagnosis present

## 2021-09-24 DIAGNOSIS — Z3A39 39 weeks gestation of pregnancy: Secondary | ICD-10-CM | POA: Diagnosis not present

## 2021-09-24 DIAGNOSIS — O9942 Diseases of the circulatory system complicating childbirth: Secondary | ICD-10-CM | POA: Diagnosis present

## 2021-09-24 DIAGNOSIS — O4292 Full-term premature rupture of membranes, unspecified as to length of time between rupture and onset of labor: Secondary | ICD-10-CM | POA: Diagnosis present

## 2021-09-24 LAB — CBC
HCT: 34.8 % — ABNORMAL LOW (ref 36.0–46.0)
Hemoglobin: 11.8 g/dL — ABNORMAL LOW (ref 12.0–15.0)
MCH: 30.9 pg (ref 26.0–34.0)
MCHC: 33.9 g/dL (ref 30.0–36.0)
MCV: 91.1 fL (ref 80.0–100.0)
Platelets: 192 10*3/uL (ref 150–400)
RBC: 3.82 MIL/uL — ABNORMAL LOW (ref 3.87–5.11)
RDW: 14.4 % (ref 11.5–15.5)
WBC: 11.9 10*3/uL — ABNORMAL HIGH (ref 4.0–10.5)
nRBC: 0.2 % (ref 0.0–0.2)

## 2021-09-24 LAB — POCT FERN TEST: POCT Fern Test: POSITIVE

## 2021-09-24 LAB — TYPE AND SCREEN
ABO/RH(D): O POS
Antibody Screen: NEGATIVE

## 2021-09-24 MED ORDER — LIDOCAINE HCL (PF) 1 % IJ SOLN: 30.0000 mL | INTRAMUSCULAR | Status: DC | PRN

## 2021-09-24 MED ORDER — OXYTOCIN-SODIUM CHLORIDE 30-0.9 UT/500ML-% IV SOLN
2.5000 [IU]/h | INTRAVENOUS | Status: DC
  Administered 2021-09-26: 2.5 [IU]/h via INTRAVENOUS

## 2021-09-24 MED ORDER — OXYTOCIN-SODIUM CHLORIDE 30-0.9 UT/500ML-% IV SOLN
1.0000 m[IU]/min | INTRAVENOUS | Status: DC
  Administered 2021-09-26: 2 m[IU]/min via INTRAVENOUS
  Filled 2021-09-24: qty 500

## 2021-09-24 MED ORDER — OXYCODONE-ACETAMINOPHEN 5-325 MG PO TABS: 1.0000 | ORAL_TABLET | ORAL | Status: DC | PRN

## 2021-09-24 MED ORDER — TERBUTALINE SULFATE 1 MG/ML IJ SOLN: 0.2500 mg | Freq: Once | INTRAMUSCULAR | Status: DC | PRN

## 2021-09-24 MED ORDER — OXYCODONE-ACETAMINOPHEN 5-325 MG PO TABS: 2.0000 | ORAL_TABLET | ORAL | Status: DC | PRN

## 2021-09-24 MED ORDER — MISOPROSTOL 50MCG HALF TABLET
50.0000 ug | ORAL_TABLET | Freq: Four times a day (QID) | ORAL | Status: AC
  Administered 2021-09-25: 50 ug via VAGINAL
  Filled 2021-09-24: qty 1

## 2021-09-24 MED ORDER — LACTATED RINGERS IV SOLN: INTRAVENOUS | Status: DC

## 2021-09-24 MED ORDER — SOD CITRATE-CITRIC ACID 500-334 MG/5ML PO SOLN: 30.0000 mL | ORAL | Status: DC | PRN

## 2021-09-24 MED ORDER — OXYTOCIN BOLUS FROM INFUSION
333.0000 mL | Freq: Once | INTRAVENOUS | Status: AC
  Administered 2021-09-26: 333 mL via INTRAVENOUS

## 2021-09-24 MED ORDER — ACETAMINOPHEN 325 MG PO TABS: 650.0000 mg | ORAL_TABLET | ORAL | Status: DC | PRN

## 2021-09-24 MED ORDER — HYDROXYZINE HCL 50 MG PO TABS: 50.0000 mg | ORAL_TABLET | Freq: Four times a day (QID) | ORAL | Status: DC | PRN

## 2021-09-24 MED ORDER — ONDANSETRON HCL 4 MG/2ML IJ SOLN: 4.0000 mg | Freq: Four times a day (QID) | INTRAMUSCULAR | Status: DC | PRN

## 2021-09-24 MED ORDER — LACTATED RINGERS IV SOLN: 500.0000 mL | INTRAVENOUS | Status: DC | PRN

## 2021-09-24 NOTE — MAU Note (Signed)
Pt says she had a gush of fluid at 624pm- clear Dr Richardson Dopp- VE closed - last Tuesday  UC- 's occ mild Denies HSV GBS- neg

## 2021-09-25 ENCOUNTER — Encounter (HOSPITAL_COMMUNITY): Payer: Self-pay | Admitting: Obstetrics and Gynecology

## 2021-09-25 ENCOUNTER — Inpatient Hospital Stay (HOSPITAL_COMMUNITY): Payer: BC Managed Care – PPO | Admitting: Anesthesiology

## 2021-09-25 ENCOUNTER — Other Ambulatory Visit: Payer: Self-pay

## 2021-09-25 LAB — RPR: RPR Ser Ql: NONREACTIVE

## 2021-09-25 MED ORDER — FENTANYL-BUPIVACAINE-NACL 0.5-0.125-0.9 MG/250ML-% EP SOLN
12.0000 mL/h | EPIDURAL | Status: DC | PRN
  Administered 2021-09-25: 12 mL/h via EPIDURAL
  Filled 2021-09-25: qty 250

## 2021-09-25 MED ORDER — LACTATED RINGERS IV SOLN
500.0000 mL | Freq: Once | INTRAVENOUS | Status: AC
  Administered 2021-09-25: 500 mL via INTRAVENOUS

## 2021-09-25 MED ORDER — DIPHENHYDRAMINE HCL 50 MG/ML IJ SOLN: 12.5000 mg | INTRAMUSCULAR | Status: DC | PRN

## 2021-09-25 MED ORDER — PHENYLEPHRINE 80 MCG/ML (10ML) SYRINGE FOR IV PUSH (FOR BLOOD PRESSURE SUPPORT): 80.0000 ug | PREFILLED_SYRINGE | INTRAVENOUS | Status: DC | PRN

## 2021-09-25 MED ORDER — EPHEDRINE 5 MG/ML INJ: 10.0000 mg | INTRAVENOUS | Status: DC | PRN

## 2021-09-25 MED ORDER — LIDOCAINE-EPINEPHRINE (PF) 1.5 %-1:200000 IJ SOLN
INTRAMUSCULAR | Status: DC | PRN
  Administered 2021-09-25: 3 mg via EPIDURAL

## 2021-09-25 MED ORDER — LIDOCAINE HCL (PF) 1 % IJ SOLN
INTRAMUSCULAR | Status: DC | PRN
  Administered 2021-09-25: 3 mL via EPIDURAL

## 2021-09-25 NOTE — Anesthesia Procedure Notes (Signed)
Epidural Patient location during procedure: OB Start time: 09/25/2021 9:36 PM End time: 09/25/2021 9:53 PM  Staffing Anesthesiologist: Lewie Loron, MD Performed: anesthesiologist   Preanesthetic Checklist Completed: patient identified, IV checked, risks and benefits discussed, monitors and equipment checked, pre-op evaluation and timeout performed  Epidural Patient position: sitting Prep: DuraPrep and site prepped and draped Patient monitoring: heart rate, continuous pulse ox and blood pressure Approach: midline Location: L2-L3 Injection technique: LOR air and LOR saline  Needle:  Needle type: Tuohy  Needle gauge: 17 G Needle length: 9 cm Needle insertion depth: 6 cm Catheter type: closed end flexible Catheter size: 19 Gauge Catheter at skin depth: 12 cm Test dose: negative  Assessment Sensory level: T8 Events: blood not aspirated, injection not painful, no injection resistance, no paresthesia and negative IV test  Additional Notes Reason for block:procedure for pain

## 2021-09-26 ENCOUNTER — Encounter (HOSPITAL_COMMUNITY): Payer: Self-pay | Admitting: Obstetrics and Gynecology

## 2021-09-26 LAB — CBC
HCT: 34 % — ABNORMAL LOW (ref 36.0–46.0)
Hemoglobin: 11.5 g/dL — ABNORMAL LOW (ref 12.0–15.0)
MCH: 30.8 pg (ref 26.0–34.0)
MCHC: 33.8 g/dL (ref 30.0–36.0)
MCV: 91.2 fL (ref 80.0–100.0)
Platelets: 178 10*3/uL (ref 150–400)
RBC: 3.73 MIL/uL — ABNORMAL LOW (ref 3.87–5.11)
RDW: 14.2 % (ref 11.5–15.5)
WBC: 14.6 10*3/uL — ABNORMAL HIGH (ref 4.0–10.5)
nRBC: 0 % (ref 0.0–0.2)

## 2021-09-26 MED ORDER — WITCH HAZEL-GLYCERIN EX PADS: 1.0000 | MEDICATED_PAD | CUTANEOUS | Status: DC | PRN

## 2021-09-26 MED ORDER — DIBUCAINE (PERIANAL) 1 % EX OINT: 1.0000 | TOPICAL_OINTMENT | CUTANEOUS | Status: DC | PRN

## 2021-09-26 MED ORDER — ACETAMINOPHEN 325 MG PO TABS
650.0000 mg | ORAL_TABLET | ORAL | Status: DC | PRN
  Administered 2021-09-26: 650 mg via ORAL
  Filled 2021-09-26: qty 2

## 2021-09-26 MED ORDER — SENNOSIDES-DOCUSATE SODIUM 8.6-50 MG PO TABS
2.0000 | ORAL_TABLET | ORAL | Status: DC
  Administered 2021-09-27: 2 via ORAL
  Filled 2021-09-26: qty 2

## 2021-09-26 MED ORDER — BENZOCAINE-MENTHOL 20-0.5 % EX AERO: 1.0000 | INHALATION_SPRAY | CUTANEOUS | Status: DC | PRN

## 2021-09-26 MED ORDER — ZOLPIDEM TARTRATE 5 MG PO TABS: 5.0000 mg | ORAL_TABLET | Freq: Every evening | ORAL | Status: DC | PRN

## 2021-09-26 MED ORDER — IBUPROFEN 600 MG PO TABS
600.0000 mg | ORAL_TABLET | Freq: Four times a day (QID) | ORAL | Status: DC
  Administered 2021-09-26 – 2021-09-27 (×6): 600 mg via ORAL
  Filled 2021-09-26 (×6): qty 1

## 2021-09-26 MED ORDER — METOPROLOL SUCCINATE ER 25 MG PO TB24
25.0000 mg | ORAL_TABLET | Freq: Every day | ORAL | Status: DC
  Filled 2021-09-26 (×3): qty 1

## 2021-09-26 MED ORDER — ONDANSETRON HCL 4 MG PO TABS: 4.0000 mg | ORAL_TABLET | ORAL | Status: DC | PRN

## 2021-09-26 MED ORDER — PRENATAL MULTIVITAMIN CH
1.0000 | ORAL_TABLET | Freq: Every day | ORAL | Status: DC
  Administered 2021-09-26 – 2021-09-27 (×2): 1 via ORAL
  Filled 2021-09-26 (×2): qty 1

## 2021-09-26 MED ORDER — SIMETHICONE 80 MG PO CHEW: 80.0000 mg | CHEWABLE_TABLET | ORAL | Status: DC | PRN

## 2021-09-26 MED ORDER — DIPHENHYDRAMINE HCL 25 MG PO CAPS: 25.0000 mg | ORAL_CAPSULE | Freq: Four times a day (QID) | ORAL | Status: DC | PRN

## 2021-09-26 MED ORDER — TETANUS-DIPHTH-ACELL PERTUSSIS 5-2.5-18.5 LF-MCG/0.5 IM SUSY: 0.5000 mL | PREFILLED_SYRINGE | Freq: Once | INTRAMUSCULAR | Status: DC

## 2021-09-26 MED ORDER — ONDANSETRON HCL 4 MG/2ML IJ SOLN: 4.0000 mg | INTRAMUSCULAR | Status: DC | PRN

## 2021-09-26 MED ORDER — COCONUT OIL OIL
1.0000 | TOPICAL_OIL | Status: DC | PRN
  Administered 2021-09-26: 1 via TOPICAL

## 2021-09-26 NOTE — Anesthesia Postprocedure Evaluation (Signed)
Anesthesia Post Note  Patient: Lindsey Stevenson  Procedure(s) Performed: AN AD HOC LABOR EPIDURAL     Patient location during evaluation: Mother Baby Anesthesia Type: Epidural Level of consciousness: awake and alert Pain management: pain level controlled Vital Signs Assessment: post-procedure vital signs reviewed and stable Respiratory status: spontaneous breathing, nonlabored ventilation and respiratory function stable Cardiovascular status: stable Postop Assessment: no headache, no backache and epidural receding Anesthetic complications: no   No notable events documented.  Last Vitals:  Vitals:   09/26/21 0410 09/26/21 0515  BP: 122/80 117/71  Pulse: 100 94  Resp: 18 16  Temp: 36.9 C 36.9 C  SpO2: 100% 100%    Last Pain:  Vitals:   09/26/21 0515  TempSrc: Oral  PainSc: 2    Pain Goal:                   [employer]

## 2021-09-26 NOTE — Lactation Note (Addendum)
This note was copied from a baby's chart. Lactation Consultation Note  Patient Name: Lindsey Stevenson BWLSL'H Date: 09/26/2021 Reason for consult: Initial assessment;Nipple pain/trauma;Other (Comment);Term (left sore nipple) Age:25 hours/ P2 / experienced BF of 8 months  Per mom the baby recently fed . LC noted the baby to be asleep.  LC offered to assess sore nipple on the Lt. And mom receptive.  LC noted cracking at the base of the  Lt. Nipple with some areola edema.  LC reviewed hand expressing, good flow and encouraged applying the EBM to nipples  liberally and use a dab of coconut oil to moisten tissue to make it easier to do the reverse pressure (stretch back). LC showed mom and the areola more compressed.  Per mom has been using the transitional cradle position. LC recommended the cross- cradle or the football which per mom is familiar with both.  LC encouraged mom to call with feeding cues for LC assessment.  Maternal Data Has patient been taught Hand Expression?: Yes (per mom  remembers) Does the patient have breastfeeding experience prior to this delivery?: Yes How long did the patient breastfeed?: per mom 8 months without issues  Feeding Mother's Current Feeding Choice: Breast Milk  LATCH Score                    Lactation Tools Discussed/Used    Interventions Interventions: Breast feeding basics reviewed;Education;Reverse pressure Provided the [facility] resource sheet  Discharge Pump: DEBP;Hands Free  Consult Status Consult Status: Follow-up Date: 09/26/21 Follow-up type: In-patient    [place] 09/26/2021, 10:19 AM

## 2021-09-26 NOTE — Lactation Note (Signed)
This note was copied from a baby's chart. Lactation Consultation Note  Patient Name: Lindsey Stevenson NBVAP'O Date: 09/26/2021 Reason for consult: L&D Initial assessment Age:25 hours P2, term female infant. Mom is experienced at breastfeeding, she BF 1st child for 6 months who is currently 10 years old.  Mom latched infant on her right breast using the cross cradle hold, infant latched with depth and was still BF after 10 minutes when LC left the room.  Mom knows to BF infant according to hunger cues, on demand 8 to 12+ times within 24 hours, skin to skin. Mom knows to call RN/ [place] on MBU if she need further latch assistance. [name] congratulated parents on the birth of their son. Maternal Data Does the patient have breastfeeding experience prior to this delivery?: Yes  Feeding Mother's Current Feeding Choice: Breast Milk and Formula  LATCH Score Latch: Grasps breast easily, tongue down, lips flanged, rhythmical sucking.  Audible Swallowing: Spontaneous and intermittent  Type of Nipple: Everted at rest and after stimulation  Comfort (Breast/Nipple): Soft / non-tender  Hold (Positioning): Assistance needed to correctly position infant at breast and maintain latch.  LATCH Score: 9   Lactation Tools Discussed/Used    Interventions Interventions: Assisted with latch;Skin to skin;Breast compression;Adjust position;Support pillows;Position options;Education  Discharge    Consult Status Consult Status: Follow-up from L&D    Vicente Serene 09/26/2021, 3:10 AM

## 2021-09-26 NOTE — Plan of Care (Signed)
  Problem: Education: Goal: Knowledge of Childbirth will improve Outcome: Adequate for Discharge Goal: Ability to make informed decisions regarding treatment and plan of care will improve Outcome: Adequate for Discharge Goal: Ability to state and carry out methods to decrease the pain will improve Outcome: Completed/Met Goal: Individualized Educational Video(s) Outcome: Not Applicable   Problem: Coping: Goal: Ability to verbalize concerns and feelings about labor and delivery will improve Outcome: Completed/Met   Problem: Life Cycle: Goal: Ability to make normal progression through stages of labor will improve Outcome: Completed/Met Goal: Ability to effectively push during vaginal delivery will improve Outcome: Completed/Met   Problem: Role Relationship: Goal: Will demonstrate positive interactions with the child Outcome: Completed/Met   Problem: Safety: Goal: Risk of complications during labor and delivery will decrease Outcome: Completed/Met   Problem: Pain Management: Goal: Relief or control of pain from uterine contractions will improve Outcome: Completed/Met

## 2021-09-26 NOTE — Progress Notes (Addendum)
Post Partum Day 0 Subjective: no complaints, up ad lib, voiding, and tolerating PO  Objective: Blood pressure 115/68, pulse 84, temperature 97.7 F (36.5 C), temperature source Oral, resp. rate 18, height '5\' 7"'$  (1.702 m), weight 99.5 kg, last menstrual period 12/21/2020, SpO2 100 %, unknown if currently breastfeeding.  Physical Exam:  General: alert, cooperative, and no distress Lochia: appropriate Uterine Fundus: firm Incision: NA DVT Evaluation: No evidence of DVT seen on physical exam.  Recent Labs    09/24/21 2137 09/26/21 0524  HGB 11.8* 11.5*  HCT 34.8* 34.0*    Assessment/Plan: Breastfeeding and Lactation consult She does not desires circumcision of newborn.  Routine postpartum care    LOS: 2 days   Christophe Louis, MD 09/26/2021, 6:38 PM

## 2021-09-27 ENCOUNTER — Ambulatory Visit: Payer: Self-pay

## 2021-09-27 ENCOUNTER — Inpatient Hospital Stay (HOSPITAL_COMMUNITY): Admit: 2021-09-27 | Payer: BC Managed Care – PPO | Admitting: Obstetrics and Gynecology

## 2021-09-27 MED ORDER — CYCLOBENZAPRINE HCL 10 MG PO TABS: 10.0000 mg | ORAL_TABLET | Freq: Three times a day (TID) | ORAL | 1 refills | Status: DC | PRN

## 2021-09-27 MED ORDER — IBUPROFEN 600 MG PO TABS: 600.0000 mg | ORAL_TABLET | Freq: Four times a day (QID) | ORAL | 1 refills | Status: DC | PRN

## 2021-09-27 NOTE — Discharge Summary (Signed)
Postpartum Discharge Summary  Date of Service: 09/27/21      Patient Name: Lindsey Stevenson DOB: 1995/08/05 MRN: [id number]  Date of admission: 09/24/2021 Delivery date:09/26/2021  Delivering provider: Burman Foster B  Date of discharge: 09/27/2021  Admitting diagnosis: Premature rupture of membranes [O42.90] Intrauterine pregnancy: [redacted]w[redacted]d    Secondary diagnosis:  Principal Problem:   Premature rupture of membranes  Additional problems: History of SVT    Discharge diagnosis: Term Pregnancy Delivered                                              Post partum procedures: None Augmentation: Pitocin and Cytotec Complications: None  Hospital course: Induction of Labor With Vaginal Delivery   26y.o. yo [id number] admitted to the hospital 09/24/2021 for induction of labor.  Indication for induction: PROM.  Patient had an uncomplicated labor course as follows: Membrane Rupture Time/Date: 6:24 PM ,09/24/2021   Delivery Method:Vaginal, Spontaneous  Episiotomy: None  Lacerations:  None  Details of delivery can be found in separate delivery note.  Patient had a routine postpartum course. Patient was given Flexeril PRN for right lower quadrant pain suspected to be MSK in nature.. Patient is discharged home 09/27/21.  Newborn Data: Birth date:09/26/2021  Birth time:2:20 AM  Gender:Female  Living status:Living  Apgars:9 ,9  Weight:3330 g   Magnesium Sulfate received: No BMZ received: No Rhophylac:N/A MMR:N/A T-DaP:Given prenatally Flu: N/A Transfusion:No  Physical exam  Vitals:   09/26/21 1120 09/26/21 1700 09/26/21 2029 09/27/21 0400  BP: 116/67 115/68 120/80 110/67  Pulse: 84 84 83 74  Resp: _0 Temp: 97.8 F (36.6 C) 97.7 F (36.5 C) 98.1 F (36.7 C) 97.8 F (36.6 C)  TempSrc: Oral Oral Oral Oral  SpO2: 100%  100% 100%  Weight:      Height:       General: alert, cooperative, and no distress Lochia: appropriate Uterine Fundus: firm Incision: N/A DVT  Evaluation: No evidence of DVT seen on physical exam. No cords or calf tenderness. Labs: Lab Results  Component Value Date   WBC 14.6 (H) 09/26/2021   HGB 11.5 (L) 09/26/2021   HCT 34.0 (L) 09/26/2021   MCV 91.2 09/26/2021   PLT 178 09/26/2021      Latest Ref Rng & Units 03/15/2021    8:50 AM  CMP  Glucose 70 - 99 mg/dL 89   BUN 6 - 20 mg/dL 5   Creatinine 0.44 - 1.00 mg/dL 0.56   Sodium 135 - 145 mmol/L 136   Potassium 3.5 - 5.1 mmol/L 4.1   Chloride 98 - 111 mmol/L 108   CO2 22 - 32 mmol/L 22   Calcium 8.9 - 10.3 mg/dL 8.9   Total Protein 6.5 - 8.1 g/dL 6.1   Total Bilirubin 0.3 - 1.2 mg/dL 0.3   Alkaline Phos 38 - 126 U/L 36   AST 15 - 41 U/L 15   ALT 0 - 44 U/L 11    Edinburgh Score:    09/26/2021    4:10 AM  Edinburgh Postnatal Depression Scale Screening Tool  I have been able to laugh and see the funny side of things. 0  I have looked forward with enjoyment to things. 0  I have blamed myself unnecessarily when things went wrong. 1  I have been anxious or worried for no good  reason. 3  I have felt scared or panicky for no good reason. 2  Things have been getting on top of me. 1  I have been so unhappy that I have had difficulty sleeping. 0  I have felt sad or miserable. 0  I have been so unhappy that I have been crying. 0  The thought of harming myself has occurred to me. 0  Edinburgh Postnatal Depression Scale Total 7      After visit meds:  Allergies as of 09/27/2021   No Known Allergies      Medication List     STOP taking these medications    promethazine 25 MG tablet Commonly known as: PHENERGAN       TAKE these medications    acetaminophen 500 MG tablet Commonly known as: TYLENOL Take 500 mg by mouth every 6 (six) hours as needed for moderate pain.   cyclobenzaprine 10 MG tablet Commonly known as: FLEXERIL Take 1 tablet (10 mg total) by mouth 3 (three) times daily as needed for muscle spasms. Do not use while driving   ibuprofen 371  MG tablet Commonly known as: ADVIL Take 1 tablet (600 mg total) by mouth every 6 (six) hours as needed for mild pain, moderate pain or cramping.   metoprolol succinate 25 MG 24 hr tablet Commonly known as: TOPROL-XL Take 25 mg by mouth daily.   prenatal multivitamin Tabs tablet Take 1 tablet by mouth daily at 12 noon.         Discharge home in stable condition Infant Feeding: Breast Infant Disposition:home with mother Discharge instruction: per After Visit Summary and Postpartum booklet. Activity: Advance as tolerated. Pelvic rest for 6 weeks.  Diet: routine diet Anticipated Birth Control:  To be discussed at her 6 week PPV Postpartum Appointment:6 weeks Additional Postpartum F/U:  None Future Appointments: Future Appointments  Date Time Provider [place]  11/02/2021  1:20 PM Tobb, Godfrey Pick, DO [facility] None   Follow up Visit:  Follow-up Information     Christophe Louis, MD Follow up in 6 week(s).   Specialty: Obstetrics and Gynecology Why: Our office will call to arrange your 6 week postpartum visit. Contact information: [address] [place] [address] [phone]                     09/27/2021 Drema Dallas, DO

## 2021-09-27 NOTE — Lactation Note (Signed)
This note was copied from a baby's chart. Lactation Consultation Note  Patient Name: Lindsey Stevenson [id number] Date: 09/27/2021 Reason for consult: Follow-up assessment;Term;Infant weight loss;Nipple pain/trauma;Breastfeeding assistance (6.01% WL) Age:25 hours  P2, Term, Female Infant, 6.01%WL, Infant at 58 hours old  LC entered the room and baby was asleep. Per mom, she has some cracks at the base of her nipples. LC assessed the tissue and there were cracks. LC woke baby by changing his wet diaper. LC assisted mom with putting baby on the left breast. Mom states that she felt pain at the top of her breast. LC flipped baby's upper lip. Mom states that she felt pain at the bottom. LC tugged baby's chin and mom states that they latch is more comfortable. Baby began to bite down on the nipple.   LC took baby off to do some suck training and assess the mouth. It appears as if baby's lingual frenulum may be tight in the posterior region. LC encouraged mom to speak with the pediatrician tomorrow.   Mom is encouraged to pull down on the chin and flip the lips when latching baby. LC also encouraged mom to avoid pacifier usage while establishing breastfeeding.   Mom will call for latch assistance if needed.  LATCH Score Latch: Grasps breast easily, tongue down, lips flanged, rhythmical sucking.  Audible Swallowing: A few with stimulation  Type of Nipple: Everted at rest and after stimulation  Comfort (Breast/Nipple): Filling, red/small blisters or bruises, mild/mod discomfort  Hold (Positioning): Assistance needed to correctly position infant at breast and maintain latch.  LATCH Score: 7   Lactation Tools Discussed/Used    Interventions Interventions: Assisted with latch;Adjust position  Discharge    Consult Status Consult Status: Follow-up Date: 09/28/21 Follow-up type: In-patient    Lysbeth Penner 09/27/2021, 8:48 PM

## 2021-09-27 NOTE — Lactation Note (Signed)
This note was copied from a baby's chart. Lactation Consultation Note  Patient Name: Lindsey Stevenson Date: 09/27/2021 Reason for consult: Follow-up assessment;Term;Infant weight loss;Nipple pain/trauma;Breastfeeding assistance (6.01% WL) Age:25 hours  P2, Term, Infant Female, 6.01%WL, Baby at 43 hours old  LC entered the room and mom was holding baby [employer]. Per mom, breastfeeding is going well, but she is experiencing some soreness. Mom states that she recently breast fed baby for 25 min . She says that she pumped using her hands-free pump and got 0.5oz (66m). [place] if she experienced soreness while pumping. She states that she had to turn down the pump. LN[place]let mom know that she can try using coconut oil on her nipples prior to pumping or take a break from pumping to try to reduce the soreness.   Mom asked if that was the normal amount that she should be producing at this time. LC let mom know that baby's typically take in 7-12 mL in addition to breastfeeding on day 2 of life.  Mom states that baby has been on and off the breast a lot and LC let her know that baby could be cluster feeding. LC encouraged mom to continue to put baby to the breast according to feeding cues.   Mom asked if pacifiers would affect her supply. LC let mom know that pacifiers may cause baby to have a shallow latch.   Mom states that baby sucks on his hands a lot and that he did the same thing in utero. LC encouraged mom to try latching baby according to cues, but if he is not actively sucking, she can take baby off of the breast.   Mom says that she has no further questions or concerns.   Mom will call for assistance if needed.   Interventions Interventions: Breast feeding basics reviewed;Education  Discharge    Consult Status Consult Status: Follow-up Date: 09/28/21 Follow-up type: In-patient    SElly ModenaBizzell 09/27/2021, 3:08 PM

## 2021-09-27 NOTE — Social Work (Signed)
CSW received consult for hx of Anxiety and Depression.  CSW met with MOB to offer support and complete assessment.    CSW met with MOB at bedside and introduced CSW role. CSW observed MOB in bed and FOB present and the infant in bassinet. CSW offered MOB privacy. MOB gave CSW permission to share all information with FOB present. MOB presented calm and engaged with CSW during the visit. CSW inquired how MOB has felt since giving birth. MOB reported that she feels okay and shared the L&D "was long but got better." MOB reported that she "felt good for the most part" during the pregnancy. MOB acknowledged that she was diagnosed with anxiety in 2020 and depression in 2022. MOB reported that she was diagnosed with depression and later learned it was postpartum depression. MOB reported that she did not initially bond with her baby and had intrusive thoughts. MOB reported that she regularly speaks with counselor "Investment banker, operational" at [place] [facility], and her next appointment is in three weeks. MOB reported that she uses learned coping strategies such as the "grounding technique" and "tensing and relaxing the body". CSW encouraged MOB efforts. MOB identified her spouse and mom as her primary supports. CSW assessed MOB for safety. MOB denied thoughts of harm to self and others.   CSW provided education regarding the baby blues period vs. perinatal mood disorders, discussed treatment and encouraged mental health follow up with [place] provider.  CSW recommended MOB complete a self-evaluation during the postpartum time period using the New Mom Checklist from Postpartum Progress and encouraged MOB to contact a medical professional if symptoms are noted at any time.   MOB reported she has essential items for the infant including a bassinet where the infant will sleep. CSW provided review of Sudden Infant Death Syndrome (SIDS) precautions. MOB has chosen [facility] for the infant's follow up care follow up care. CSW  assessed MOB for additional needs. MOB reported no further need.   CSW identifies no further need for intervention and no barriers to discharge at this time.   Kathrin Greathouse, MSW, LCSW [facility] [place] Worker  [phone] 09/27/2021  1:55 PM

## 2021-09-28 ENCOUNTER — Ambulatory Visit: Payer: Self-pay

## 2021-09-28 NOTE — Lactation Note (Signed)
This note was copied from a baby's chart. Lactation Consultation Note  Patient Name: Lindsey Stevenson Date: 09/28/2021 Reason for consult: Follow-up assessment;Term;Infant weight loss;Other (Comment);Nipple pain/trauma (6 % weight loss, per mom has sore nipples and thats why the baby has been bottle fed . LC offered to assess Breast tissue, mom receptive. Positional strips noted and areola edema. Per mom has desire to Breast feed.) Age:25 hours LC discussed how to transition back to the breast since the baby has had bottles,  Give the baby a appetizer of EBM or formula - then latch and take the baby to the breast calm.  1st prior to latch - breast massage,hand express, pre- pump if needed, reverse pressure ( as shown ) and latch with firm support and breast compressions until swallows.  LC reviewed BF D/C teaching and provided ( hand pump and shells )  Mom aware of the [facility] resources after D/C.  Maternal Data Has patient been taught Hand Expression?: Yes  Feeding Mother's Current Feeding Choice: Breast Milk and Formula Nipple Type: Slow - flow  LATCH Score                    Lactation Tools Discussed/Used    Interventions Interventions: Breast feeding basics reviewed;Education;LC Services brochure  Discharge Discharge Education: Engorgement and breast care;Warning signs for feeding baby Pump: Manual;DEBP;Personal  Consult Status Consult Status: Complete Date: 09/28/21    Myer Haff 09/28/2021, 9:23 AM

## 2021-10-04 ENCOUNTER — Telehealth (HOSPITAL_COMMUNITY): Payer: Self-pay | Admitting: *Deleted

## 2021-10-04 NOTE — Telephone Encounter (Signed)
Left phone voicemail message.  Odis Hollingshead, RN 10-04-2021 at 10:47am

## 2021-10-23 ENCOUNTER — Emergency Department (HOSPITAL_COMMUNITY)
Admission: EM | Admit: 2021-10-23 | Discharge: 2021-10-24 | Discharge status: Against Medical Advice | Payer: BC Managed Care – PPO | Attending: Emergency Medicine | Admitting: Emergency Medicine

## 2021-10-23 DIAGNOSIS — N644 Mastodynia: Secondary | ICD-10-CM | POA: Insufficient documentation

## 2021-10-23 DIAGNOSIS — Z5321 Procedure and treatment not carried out due to patient leaving prior to being seen by health care provider: Secondary | ICD-10-CM | POA: Diagnosis not present

## 2021-10-23 DIAGNOSIS — R519 Headache, unspecified: Secondary | ICD-10-CM | POA: Diagnosis not present

## 2021-10-24 ENCOUNTER — Encounter (HOSPITAL_COMMUNITY): Payer: Self-pay | Admitting: *Deleted

## 2021-10-24 ENCOUNTER — Other Ambulatory Visit: Payer: Self-pay

## 2021-10-24 LAB — CBC
HCT: 40.8 % (ref 36.0–46.0)
Hemoglobin: 13.5 g/dL (ref 12.0–15.0)
MCH: 29.7 pg (ref 26.0–34.0)
MCHC: 33.1 g/dL (ref 30.0–36.0)
MCV: 89.7 fL (ref 80.0–100.0)
Platelets: 245 10*3/uL (ref 150–400)
RBC: 4.55 MIL/uL (ref 3.87–5.11)
RDW: 12.7 % (ref 11.5–15.5)
WBC: 8.6 10*3/uL (ref 4.0–10.5)
nRBC: 0 % (ref 0.0–0.2)

## 2021-10-24 LAB — URINALYSIS, ROUTINE W REFLEX MICROSCOPIC
Bacteria, UA: NONE SEEN
Bilirubin Urine: NEGATIVE
Glucose, UA: NEGATIVE mg/dL
Ketones, ur: NEGATIVE mg/dL
Leukocytes,Ua: NEGATIVE
Nitrite: NEGATIVE
Protein, ur: NEGATIVE mg/dL
Specific Gravity, Urine: 1.013 (ref 1.005–1.030)
pH: 7 (ref 5.0–8.0)

## 2021-10-24 LAB — COMPREHENSIVE METABOLIC PANEL
ALT: 26 U/L (ref 0–44)
AST: 20 U/L (ref 15–41)
Albumin: 3.8 g/dL (ref 3.5–5.0)
Alkaline Phosphatase: 75 U/L (ref 38–126)
Anion gap: 7 (ref 5–15)
BUN: 12 mg/dL (ref 6–20)
CO2: 26 mmol/L (ref 22–32)
Calcium: 9.8 mg/dL (ref 8.9–10.3)
Chloride: 107 mmol/L (ref 98–111)
Creatinine, Ser: 0.95 mg/dL (ref 0.44–1.00)
GFR, Estimated: >60 mL/min (ref >60)
Glucose, Bld: 97 mg/dL (ref 70–99)
Potassium: 4.4 mmol/L (ref 3.5–5.1)
Sodium: 140 mmol/L (ref 135–145)
Total Bilirubin: 0.1 mg/dL — ABNORMAL LOW (ref 0.3–1.2)
Total Protein: 6.9 g/dL (ref 6.5–8.1)

## 2021-10-24 LAB — I-STAT BETA HCG BLOOD, ED (MC, WL, AP ONLY): I-stat hCG, quantitative: <5 m[IU]/mL (ref <5)

## 2021-10-24 LAB — LIPASE, BLOOD: Lipase: 34 U/L (ref 11–51)

## 2021-10-24 NOTE — ED Notes (Signed)
The patient informed this EMT that she wanted to be removed from the waiting list. This EMT informed the patient that if her symptoms worsen to seek medical attention.

## 2021-10-24 NOTE — ED Triage Notes (Signed)
Pt reports she had a pain just under her right breast that started about 1 hour ago. Describes the pain as dull. Says she checked her BP at home and it was 130/93. Headache off and on all day.

## 2021-10-26 ENCOUNTER — Inpatient Hospital Stay (HOSPITAL_COMMUNITY)
Admission: AD | Admit: 2021-10-26 | Discharge: 2021-10-27 | Disposition: A | Discharge status: Home / Self Care | Payer: BC Managed Care – PPO | Attending: Obstetrics and Gynecology | Admitting: Obstetrics and Gynecology

## 2021-10-26 DIAGNOSIS — I471 Supraventricular tachycardia: Secondary | ICD-10-CM | POA: Insufficient documentation

## 2021-10-26 DIAGNOSIS — R519 Headache, unspecified: Secondary | ICD-10-CM

## 2021-10-26 DIAGNOSIS — O9089 Other complications of the puerperium, not elsewhere classified: Secondary | ICD-10-CM | POA: Insufficient documentation

## 2021-10-26 LAB — URINALYSIS, ROUTINE W REFLEX MICROSCOPIC
Bacteria, UA: NONE SEEN
Bilirubin Urine: NEGATIVE
Glucose, UA: NEGATIVE mg/dL
Ketones, ur: 5 mg/dL — AB
Nitrite: NEGATIVE
Protein, ur: NEGATIVE mg/dL
Specific Gravity, Urine: 1.017 (ref 1.005–1.030)
pH: 6 (ref 5.0–8.0)

## 2021-10-26 MED ORDER — IBUPROFEN 800 MG PO TABS
800.0000 mg | ORAL_TABLET | Freq: Once | ORAL | Status: AC
  Administered 2021-10-26: 800 mg via ORAL
  Filled 2021-10-26: qty 1

## 2021-10-26 MED ORDER — BUTALBITAL-APAP-CAFFEINE 50-325-40 MG PO TABS: 2.0000 | ORAL_TABLET | Freq: Once | ORAL | Status: DC

## 2021-10-26 NOTE — MAU Provider Note (Signed)
History     CSN: [id number]  Arrival date and time: 10/26/21 2159   Event Date/Time   First Provider Initiated Contact with Patient 10/26/21 2315      Chief Complaint  Patient presents with   Headache   Hypertension   Lindsey Stevenson is a 26 y.o. G2P2002 at 4 weeks Postpartum who receives care at [facility]. She reports a diagnosis of SVT during the pregnancy, but had no other issues during prenatal care or delivery. She reports her next appt is on August 10th. She presents today for Headache.  She reports her HA started "earlier today around 4 or 5pm" and took tylenol without resolution.  She states the HA is located on her right side and causing eye pain. She describes it as a "throbbing" that is constant, but worsened with certain movements.  She denies relieving factors and rates the pain a 7-8/10. She states has had "little headaches" since delivery, but nothing like this. She clarifies that with previous HA taking medication resolved her symptoms. She is currently taking a PNV and last dose of tylenol was around 1800.      OB History     Gravida  2   Para  2   Term  2   Preterm      AB      Living  2      SAB      IAB      Ectopic      Multiple  0   Live Births  2           Past Medical History:  Diagnosis Date   Anxiety    Depression    Migraine    SVT (supraventricular tachycardia) (HCC)    SVT (supraventricular tachycardia) (HCC)    Vaginal Pap smear, abnormal 2019   HPV    Past Surgical History:  Procedure Laterality Date   NO PAST SURGERIES      Family History  Problem Relation Age of Onset   Cancer Maternal Grandmother    Arthritis Maternal Grandmother    Cancer Maternal Grandfather    Hypertension Paternal Grandmother    Anxiety disorder Mother    Hypertension Mother    Hypertension Father    Asthma Brother    Diabetes Brother    Early death Sister     Social History   Tobacco Use   Smoking status: Never   Smokeless  tobacco: Never  Vaping Use   Vaping Use: Never used  Substance Use Topics   Alcohol use: Not Currently   Drug use: Never    Allergies: No Known Allergies  Medications Prior to Admission  Medication Sig Dispense Refill Last Dose   acetaminophen (TYLENOL) 500 MG tablet Take 500 mg by mouth every 6 (six) hours as needed for moderate pain.      cyclobenzaprine (FLEXERIL) 10 MG tablet Take 1 tablet (10 mg total) by mouth 3 (three) times daily as needed for muscle spasms. Do not use while driving 30 tablet 1    ibuprofen (ADVIL) 600 MG tablet Take 1 tablet (600 mg total) by mouth every 6 (six) hours as needed for mild pain, moderate pain or cramping. 30 tablet 1    metoprolol succinate (TOPROL-XL) 25 MG 24 hr tablet Take 25 mg by mouth daily.      Prenatal Vit-Fe Fumarate-FA (PRENATAL MULTIVITAMIN) TABS tablet Take 1 tablet by mouth daily at 12 noon.       Review of Systems  Constitutional:  Negative for chills and fever.  Eyes:  Positive for visual disturbance (Blurry vision).  Gastrointestinal:  Positive for nausea. Negative for vomiting.  Neurological:  Positive for headaches. Negative for dizziness and light-headedness.   Physical Exam   Blood pressure 130/88, pulse 93, temperature 97.8 F (36.6 C), resp. rate 17, height '5\' 7"'$  (1.702 m), weight 88.9 kg, SpO2 100 %, currently breastfeeding.  Physical Exam Vitals reviewed.  Constitutional:      General: She is not in acute distress.    Appearance: Normal appearance. She is well-developed.  HENT:     Head: Normocephalic and atraumatic.  Eyes:     Extraocular Movements: Extraocular movements intact.     Conjunctiva/sclera: Conjunctivae normal.     Pupils: Pupils are equal, round, and reactive to light.  Cardiovascular:     Rate and Rhythm: Normal rate and regular rhythm.  Pulmonary:     Effort: Pulmonary effort is normal. No respiratory distress.  Musculoskeletal:     Cervical back: Normal range of motion.  Skin:    General:  Skin is warm and dry.  Neurological:     Mental Status: She is alert and oriented to person, place, and time.     Cranial Nerves: Cranial nerves 2-12 are intact.     Coordination: Coordination is intact.  Psychiatric:        Mood and Affect: Mood normal.        Behavior: Behavior normal.     MAU Course  Procedures Results for orders placed or performed during the hospital encounter of 10/26/21 (from the past 24 hour(s))  Urinalysis, Routine w reflex microscopic Urine, Clean Catch     Status: Abnormal   Collection Time: 10/26/21 10:45 PM  Result Value Ref Range   Color, Urine YELLOW YELLOW   APPearance CLEAR CLEAR   Specific Gravity, Urine 1.017 1.005 - 1.030   pH 6.0 5.0 - 8.0   Glucose, UA NEGATIVE NEGATIVE mg/dL   Hgb urine dipstick SMALL (A) NEGATIVE   Bilirubin Urine NEGATIVE NEGATIVE   Ketones, ur 5 (A) NEGATIVE mg/dL   Protein, ur NEGATIVE NEGATIVE mg/dL   Nitrite NEGATIVE NEGATIVE   Leukocytes,Ua TRACE (A) NEGATIVE   RBC / HPF 11-20 0 - 5 RBC/hpf   WBC, UA 0-5 0 - 5 WBC/hpf   Bacteria, UA NONE SEEN NONE SEEN   Squamous Epithelial / LPF 0-5 0 - 5   Mucus PRESENT     MDM Pain Medication Exam Assessment and Plan  26 year old 4 weeks Postpartum Headache  -POC Reviewed. -Initially offered Fioricet, but patient reports SVT with caffeine. -Discussed usage of Ibuprofen and patient agreeable. -If no relief will consider IV mgmt. -UA pending.   Maryann Conners 10/26/2021, 11:16 PM   Reassessment (12:22 AM) -Patient reports resolution of HA. -UA without significant findings.  -Discussed continued monitoring of HA frequency, severity, and improvements. -Informed that if becoming common would recommend follow up with PCP for neurology referral. -Encouraged hydration especially during hotter temperatures.  -Patient verbalizes understanding. No questions. -Discharged to home in improved condition.  Maryann Conners MSN, CNM Advanced Practice Provider, Center for  [employer]

## 2021-10-26 NOTE — MAU Note (Signed)
.  Lindsey Stevenson is a 26 y.o. at Unknown here in MAU reporting: headache today after taking a nap. B/p was 130something over 94. Took Tylenol '500mg'$ x2 at 1700 but did not help. Some shoulder pain on radiating between L shoulder in the upper back.  Pt had svd on 09/26/21. Breastfeeding  Onset of complaint: several days for h/a Pain score: 8 for h/a and 2 for upper back pain Vitals:   10/26/21 2230 10/26/21 2232  BP:  130/88  Pulse: 93   Resp: 17   Temp: 97.8 F (36.6 C)   SpO2: 100%      FHT:n/a Lab orders placed from triage:  none

## 2021-10-27 DIAGNOSIS — R519 Headache, unspecified: Secondary | ICD-10-CM

## 2021-10-27 NOTE — Discharge Instructions (Signed)
AREA FAMILY PRACTICE PHYSICIANS  [place] ([address]) [facility] [address] [place], [place] [address] [phone] Mon-Fri 8:30-12:30, 1:30-5:00 Accepting Medicaid [facility] [address], [place], [place] [address] [phone] Mon-Fri 8:00-5:30 [facility] [address] [place], [place] [address] [phone] Mon, Tue, Thur, Fri 8:30-5:00, Wed 10:00-7:00 (closed 1-2pm) Accepting Medicaid [facility] [address], [place], [place]  [address] Phone - [phone]   Fax - [phone]  [place] ([address]) [facility] [address] [place], [place] [address] [phone] Mon-Fri 8:00-5:00 [facility] ([facility])  [address] [place], [place] [address] [phone] Mon-Fri 8:30-5:30, Sat (Oct.-Mar.) 9:00-1:00 Accepting Medicaid  [place] ([address]) [facility] [address], [place], [place] [address] [phone] Mon-Fri 8:00-5:00  [place] ([address]) [facility] [address], [place], [place] [address] [phone] Mon-Fri 8:00-5:00 [facility] [address], [place], [place] [address] [phone] Mon-Fri 8:00-5:00 [facility] [address] [place], [place] [address] [phone] Mon-Fri 8:00-5:00 [facility] [address] [place] [address] [phone] Mon-Fri 7:30-5:30  [place] ([address] & [address]) [facility] [address] [place], [place] [address] [phone] Mon-Thur 8:00-6:00 Accepting Medicaid [facility] [address] [place], [place] [address] [phone] Mon-Thur 7:30-7:30, Fri 7:30-4:30 Accepting  Medicaid [facility] [address], [place], [place]  [address] [phone]   Fax - [phone]  [place] ([address] & [address]) [facility] [address] [place], [place] [address] [phone] Mon-Fri 7:00-5:00 [facility] [address], [place], [place] [address] [phone] Mon-Fri 8:00-5:00 Accepting Medicaid [facility] [address], , [place] [address] [phone] Mon-Fri 8:00-5:00 Accepting Medicaid  [place] ([address]) [facility] [place] [address] [place], [place] [address] [phone] Mon-Fri 8:00-5:00 [facility] [address], [place], [place] [address] [phone] Mon-Fri 8:00-5:00 Accepting Medicaid [facility] ([facility]) [address], [place], [place] [address] [phone] Mon-Fri 8:00-5:30, Sat&Sun by appointment (phones open at 8:30) Accepting Medicaid  [place] ([address]) [facility] [address] [place], [place] [address] [phone] Mon-Thur 8:00-7:00, Fri 8:00-5:00, Sat 8:00-12:00, Sun 9:00-12:00 Accepting Medicaid [facility] [place] [address] [place], [place], [place] [address] [phone] Mon-Thur 8:00-5:00 Accepting Medicaid [facility] [address] [place], [place] [address] [phone] Mon-Fri 8:00-5:30, Sat (Oct.-Mar.) 9:00-1:00 Accepting Medicaid  [place] ([address]) [facility] [address], [place], [place] [address] [phone] Mon-Fri 8:00-5:00 Accepting Medicaid   [place] ([address]) [facility] [place] [address], [place], [place] [address] [phone] Mon-Fri 8:00-5:00 [facility] [place] [address], [place], [place] [address] [phone] Mon-Fri 8:00-5:00 [facility] [place] [address] [place] [address], [place], [place] [address] [phone] Mon-Fri 8:00-5:00 After hours clinic ([address] [place], [place] [address]) [phone] Mon-Fri 5:00-8:00, Sat 12:00-6:00, Sun 10:00-4:00 Accepting Medicaid [facility] [place]   [address], [place], [place]  [address] [phone]   Fax - [phone]  [place] ([address]) [facility] [place] [address], [place], [place] [address] [phone] Mon-Fri 8:00-5:00 [facility] [place] ([facility] [place]) [address], [place], [place] [address] [phone] Mon-Thur 8:00-7:00, Fri 8:00-5:00, Sat 8:00-12:00    

## 2021-10-27 NOTE — Progress Notes (Signed)
Gavin Pound CNM in earlier to discuss d/c plan. Written and verbal d/c instructions given and understanding voiced

## 2021-11-02 ENCOUNTER — Encounter: Payer: Self-pay | Admitting: Cardiology

## 2021-11-02 ENCOUNTER — Telehealth (INDEPENDENT_AMBULATORY_CARE_PROVIDER_SITE_OTHER): Payer: BC Managed Care – PPO | Admitting: Cardiology

## 2021-11-02 VITALS — BP 141/84 | HR 85 | Ht 67.0 in | Wt 190.0 lb

## 2021-11-02 DIAGNOSIS — I471 Supraventricular tachycardia, unspecified: Secondary | ICD-10-CM

## 2021-11-02 DIAGNOSIS — R030 Elevated blood-pressure reading, without diagnosis of hypertension: Secondary | ICD-10-CM

## 2021-11-02 NOTE — Patient Instructions (Signed)
Medication Instructions:  Your physician recommends that you continue on your current medications as directed. Please refer to the Current Medication list given to you today.   Please take your blood pressure daily for 2 weeks and send in a MyChart message. Please include heart rates. If your blood pressure is greater then 140/90 for two days in a row please contact my office.   HOW TO TAKE YOUR BLOOD PRESSURE: Rest 5 minutes before taking your blood pressure. Don't smoke or drink caffeinated beverages for at least 30 minutes before. Take your blood pressure before (not after) you eat. Sit comfortably with your back supported and both feet on the floor (don't cross your legs). Elevate your arm to heart level on a table or a desk. Use the proper sized cuff. It should fit smoothly and snugly around your bare upper arm. There should be enough room to slip a fingertip under the cuff. The bottom edge of the cuff should be 1 inch above the crease of the elbow. Ideally, take 3 measurements at one sitting and record the average. *If you need a refill on your cardiac medications before your next appointment, please call your pharmacy*   Lab Work: None If you have labs (blood work) drawn today and your tests are completely normal, you will receive your results only by: [facility] (if you have MyChart) OR A paper copy in the mail If you have any lab test that is abnormal or we need to change your treatment, we will call you to review the results.   Testing/Procedures: None   Follow-Up: At [facility], you and your health needs are our priority.  As part of our continuing mission to provide you with exceptional heart care, we have created designated Provider Care Teams.  These Care Teams include your primary Cardiologist (physician) and Advanced Practice Providers (APPs -  Physician Assistants and Nurse Practitioners) who all work together to provide you with the care you need, when you need  it.  We recommend signing up for the patient portal called "MyChart".  Sign up information is provided on this After Visit Summary.  MyChart is used to connect with patients for Virtual Visits (Telemedicine).  Patients are able to view lab/test results, encounter notes, upcoming appointments, etc.  Non-urgent messages can be sent to your provider as well.   To learn more about what you can do with MyChart, go to [email or web address].    Your next appointment:   8 week(s)  The format for your next appointment:   In Person  Provider:   Berniece Salines, DO [address], [place], [place] [address]

## 2021-11-02 NOTE — Progress Notes (Signed)
[facility]  Follow Up Note   Date:  11/05/2021   ID:  Lindsey Stevenson, DOB [birth year], MRN [id number]  PCP:  Kristen Loader, [place] [facility] Cardiologist:  Janina Mayo, MD  Electrophysiologist:  None        Referring MD: Kathyrn Lass, MD   Chief Complaint: " I am doing ok"   The patient is home. I am in the office.   Virtual Visit via Video  Note . I connected with the patient today by a video enabled telemedicine application and verified that I am speaking with the correct person using two identifiers.   History of Present Illness:    Lindsey Stevenson is a 26 y.o. female [[id number]] who returns for follow up of PSVT, obesity.   She is breastfeeding. Back to work   Prior CV Studies Reviewed: The following studies were reviewed today: Zio monitor 04/23/2021 Predominantly sinus rhythm with PACs. No significant tachyarrhythmia or bradyarrhythmia. No atrial fibrillation or flutter.   TTE 03/15/2022  IMPRESSIONS     1. Left ventricular ejection fraction, by estimation, is 60 to 65%. The  left ventricle has normal function. The left ventricle has no regional  wall motion abnormalities. Left ventricular diastolic parameters were  normal.   2. Right ventricular systolic function is normal. The right ventricular  size is normal. There is normal pulmonary artery systolic pressure.   3. Left atrial size was moderately dilated.   4. The mitral valve is normal in structure. Trivial mitral valve  regurgitation. No evidence of mitral stenosis.   5. The aortic valve is tricuspid. Aortic valve regurgitation is not  visualized. No aortic stenosis is present.   6. The inferior vena cava is normal in size with greater than 50%  respiratory variability, suggesting right atrial pressure of 3 mmHg.   FINDINGS   Left Ventricle: Left ventricular ejection fraction, by estimation, is 60  to 65%. The left ventricle has normal function. The left ventricle  has no  regional wall motion abnormalities. The left ventricular internal cavity  size was normal in size. There is   no left ventricular hypertrophy. Left ventricular diastolic parameters  were normal.   Right Ventricle: The right ventricular size is normal. No increase in right ventricular wall thickness. Right ventricular systolic function is normal. There is normal pulmonary artery systolic pressure. The tricuspid regurgitant velocity is 2.36 m/s, and  with an assumed right atrial pressure of 3 mmHg, the estimated right ventricular systolic pressure is 13.0 mmHg.   Left Atrium: Left atrial size was moderately dilated.   Right Atrium: Right atrial size was normal in size.   Pericardium: There is no evidence of pericardial effusion.   Mitral Valve: The mitral valve is normal in structure. Trivial mitral valve regurgitation. No evidence of mitral valve stenosis.   Tricuspid Valve: The tricuspid valve is normal in structure. Tricuspid  valve regurgitation is trivial. No evidence of tricuspid stenosis.   Aortic Valve: The aortic valve is tricuspid. Aortic valve regurgitation is  not visualized. No aortic stenosis is present. Aortic valve mean gradient  measures 5.0 mmHg. Aortic valve peak gradient measures 10.0 mmHg. Aortic  valve area, by VTI measures 2.14   cm.   Pulmonic Valve: The pulmonic valve was normal in structure. Pulmonic valve  regurgitation is trivial. No evidence of pulmonic stenosis.   Aorta: The aortic root is normal in size and structure.   Venous: The inferior vena cava is normal in size  with greater than 50%  respiratory variability, suggesting right atrial pressure of 3 mmHg.   IAS/Shunts: No atrial level shunt detected by color flow Doppler.    Past Medical History:  Diagnosis Date   Anxiety    Depression    Migraine    SVT (supraventricular tachycardia) (HCC)    SVT (supraventricular tachycardia) (HCC)    Vaginal Pap smear, abnormal 2019   HPV     Past Surgical History:  Procedure Laterality Date   NO PAST SURGERIES          Current Medications: Current Meds  Medication Sig   acetaminophen (TYLENOL) 500 MG tablet Take 500 mg by mouth every 6 (six) hours as needed for moderate pain.   cholecalciferol (VITAMIN D3) 25 MCG (1000 UNIT) tablet Take 2,000 Units by mouth daily.   ibuprofen (ADVIL) 600 MG tablet Take 1 tablet (600 mg total) by mouth every 6 (six) hours as needed for mild pain, moderate pain or cramping.   methylcellulose oral powder Take 1 packet by mouth daily.   Prenatal Vit-Fe Fumarate-FA (PRENATAL MULTIVITAMIN) TABS tablet Take 1 tablet by mouth daily at 12 noon.     Allergies:   Patient has no known allergies.   Social History   Socioeconomic History   Marital status: Married    Spouse name: Kevan Ny   Number of children: Not on file   Years of education: Not on file   Highest education level: Not on file  Occupational History   Not on file  Tobacco Use   Smoking status: Never   Smokeless tobacco: Never  Vaping Use   Vaping Use: Never used  Substance and Sexual Activity   Alcohol use: Not Currently   Drug use: Never   Sexual activity: Yes    Birth control/protection: None  Other Topics Concern   Not on file  Social History Narrative   Not on file   Social Determinants of Health   Financial Resource Strain: Not on file  Food Insecurity: Not on file  Transportation Needs: Not on file  Physical Activity: Not on file  Stress: Not on file  Social Connections: Not on file      Family History  Problem Relation Age of Onset   Cancer Maternal Grandmother    Arthritis Maternal Grandmother    Cancer Maternal Grandfather    Hypertension Paternal Grandmother    Anxiety disorder Mother    Hypertension Mother    Hypertension Father    Asthma Brother    Diabetes Brother    Early death Sister       ROS:   Please see the history of present illness.     All other systems reviewed and are  negative.   Labs/EKG Reviewed:    EKG:  EKG is  was not ordered today.    Recent Labs: 10/24/2021: ALT 26; BUN 12; Creatinine, Ser 0.95; Hemoglobin 13.5; Platelets 245; Potassium 4.4; Sodium 140   Recent Lipid Panel No results found for: "CHOL", "TRIG", "HDL", "CHOLHDL", "LDLCALC", "LDLDIRECT"  Physical Exam:    VS:  BP (!) 141/84   Pulse 85   Ht '5\' 7"'$  (1.702 m)   Wt 190 lb (86.2 kg)   BMI 29.76 kg/m     Wt Readings from Last 3 Encounters:  11/02/21 190 lb (86.2 kg)  10/26/21 196 lb (88.9 kg)  10/24/21 198 lb (89.8 kg)    Virtual visit no physical exam   Risk Assessment/Risk Calculators:  ASSESSMENT & PLAN:    Paroxysmal SVT Elevated isolated blood pressure reading  Have asked the patient to take her blood pressure daily she will send me this information in 2 weeks on MyChart.  And if her blood pressure remains elevated I plan to start her on low-dose antihypertensive medication. No symptoms for her SVT.  Discussed with the patient once we see her and if her blood pressure does not needs to be addressed I will refer her back to her primary cardiologist and discharged her from the cardio OB clinic.  Patient Instructions  Medication Instructions:  Your physician recommends that you continue on your current medications as directed. Please refer to the Current Medication list given to you today.   Please take your blood pressure daily for 2 weeks and send in a MyChart message. Please include heart rates. If your blood pressure is greater then 140/90 for two days in a row please contact my office.   HOW TO TAKE YOUR BLOOD PRESSURE: Rest 5 minutes before taking your blood pressure. Don't smoke or drink caffeinated beverages for at least 30 minutes before. Take your blood pressure before (not after) you eat. Sit comfortably with your back supported and both feet on the floor (don't cross your legs). Elevate your arm to heart level on a table or a desk. Use the  proper sized cuff. It should fit smoothly and snugly around your bare upper arm. There should be enough room to slip a fingertip under the cuff. The bottom edge of the cuff should be 1 inch above the crease of the elbow. Ideally, take 3 measurements at one sitting and record the average. *If you need a refill on your cardiac medications before your next appointment, please call your pharmacy*   Lab Work: None If you have labs (blood work) drawn today and your tests are completely normal, you will receive your results only by: [facility] (if you have MyChart) OR A paper copy in the mail If you have any lab test that is abnormal or we need to change your treatment, we will call you to review the results.   Testing/Procedures: None   Follow-Up: At [facility], you and your health needs are our priority.  As part of our continuing mission to provide you with exceptional heart care, we have created designated Provider Care Teams.  These Care Teams include your primary Cardiologist (physician) and Advanced Practice Providers (APPs -  Physician Assistants and Nurse Practitioners) who all work together to provide you with the care you need, when you need it.  We recommend signing up for the patient portal called "MyChart".  Sign up information is provided on this After Visit Summary.  MyChart is used to connect with patients for Virtual Visits (Telemedicine).  Patients are able to view lab/test results, encounter notes, upcoming appointments, etc.  Non-urgent messages can be sent to your provider as well.   To learn more about what you can do with MyChart, go to [email or web address].    Your next appointment:   8 week(s)  The format for your next appointment:   In Person  Provider:   Berniece Salines, DO [address], [place], [place] [address]  Dispo:  Return in about 8 weeks (around 12/28/2021).   Medication Adjustments/Labs and Tests Ordered: Current medicines are reviewed at  length with the patient today.  Concerns regarding medicines are outlined above.  Tests Ordered: No orders of the defined types were placed in this encounter.  Medication Changes:  No orders of the defined types were placed in this encounter.

## 2021-11-05 DIAGNOSIS — I471 Supraventricular tachycardia, unspecified: Secondary | ICD-10-CM | POA: Insufficient documentation

## 2021-11-05 DIAGNOSIS — R030 Elevated blood-pressure reading, without diagnosis of hypertension: Secondary | ICD-10-CM | POA: Insufficient documentation

## 2021-12-15 ENCOUNTER — Emergency Department (HOSPITAL_BASED_OUTPATIENT_CLINIC_OR_DEPARTMENT_OTHER)
Admission: EM | Admit: 2021-12-15 | Discharge: 2021-12-15 | Disposition: A | Discharge status: Home / Self Care | Payer: BC Managed Care – PPO | Attending: Emergency Medicine | Admitting: Emergency Medicine

## 2021-12-15 ENCOUNTER — Other Ambulatory Visit: Payer: Self-pay

## 2021-12-15 ENCOUNTER — Encounter (HOSPITAL_BASED_OUTPATIENT_CLINIC_OR_DEPARTMENT_OTHER): Payer: Self-pay | Admitting: Emergency Medicine

## 2021-12-15 ENCOUNTER — Emergency Department (HOSPITAL_BASED_OUTPATIENT_CLINIC_OR_DEPARTMENT_OTHER): Payer: BC Managed Care – PPO | Admitting: Radiology

## 2021-12-15 DIAGNOSIS — X509XXA Other and unspecified overexertion or strenuous movements or postures, initial encounter: Secondary | ICD-10-CM | POA: Insufficient documentation

## 2021-12-15 DIAGNOSIS — S93402A Sprain of unspecified ligament of left ankle, initial encounter: Secondary | ICD-10-CM | POA: Insufficient documentation

## 2021-12-15 DIAGNOSIS — S99912A Unspecified injury of left ankle, initial encounter: Secondary | ICD-10-CM | POA: Diagnosis present

## 2021-12-15 NOTE — ED Provider Notes (Signed)
[facility] EMERGENCY DEPT Provider Note   CSN: [id number] Arrival date & time: 12/15/21  1922     History  Chief Complaint  Patient presents with   Ankle Pain    Lindsey Stevenson is a 26 y.o. female presenting to the ED with left ankle pain after accidentally tripping over one of her child's shoes in the house.  She rolled her ankle.  She is still able to ambulate.  HPI     Home Medications Prior to Admission medications   Medication Sig Start Date End Date Taking? Authorizing Provider  acetaminophen (TYLENOL) 500 MG tablet Take 500 mg by mouth every 6 (six) hours as needed for moderate pain.    [provider]  cholecalciferol (VITAMIN D3) 25 MCG (1000 UNIT) tablet Take 2,000 Units by mouth daily.    [provider]  ibuprofen (ADVIL) 600 MG tablet Take 1 tablet (600 mg total) by mouth every 6 (six) hours as needed for mild pain, moderate pain or cramping. 09/27/21   Drema Dallas, DO  methylcellulose oral powder Take 1 packet by mouth daily.    [provider]  Prenatal Vit-Fe Fumarate-FA (PRENATAL MULTIVITAMIN) TABS tablet Take 1 tablet by mouth daily at 12 noon.    [provider]      Allergies    Patient has no known allergies.    Review of Systems   Review of Systems  Physical Exam Updated Vital Signs BP 129/88 (BP Location: Right Arm)   Pulse 81   Temp 98.6 F (37 C) (Temporal)   Resp 18   SpO2 100%  Physical Exam Constitutional:      General: She is not in acute distress. HENT:     Head: Normocephalic and atraumatic.  Eyes:     Conjunctiva/sclera: Conjunctivae normal.     Pupils: Pupils are equal, round, and reactive to light.  Cardiovascular:     Rate and Rhythm: Normal rate and regular rhythm.     Pulses: Normal pulses.  Pulmonary:     Effort: Pulmonary effort is normal. No respiratory distress.  Musculoskeletal:     Comments: Left posterior lateral malleoli or tenderness.  Full range of motion at  the ankle and toes.  No tenderness at the base of the fifth metatarsal  Skin:    General: Skin is warm and dry.  Neurological:     General: No focal deficit present.     Mental Status: She is alert. Mental status is at baseline.  Psychiatric:        Mood and Affect: Mood normal.        Behavior: Behavior normal.     ED Results / Procedures / Treatments   Labs (all labs ordered are listed, but only abnormal results are displayed) Labs Reviewed - No data to display  EKG None  Radiology DG Ankle Complete Left  Result Date: 12/15/2021 CLINICAL DATA:  Left ankle pain after tripping. EXAM: LEFT ANKLE COMPLETE - 3+ VIEW COMPARISON:  None Available. FINDINGS: There is no evidence of fracture, dislocation, or joint effusion. There is no evidence of arthropathy or other focal bone abnormality. Soft tissues are unremarkable. IMPRESSION: Negative. Electronically Signed   By: Zerita Boers M.D.   On: 12/15/2021 20:09    Procedures Procedures    Medications Ordered in ED Medications - No data to display  ED Course/ Medical Decision Making/ A&P  Medical Decision Making Amount and/or Complexity of Data Reviewed Radiology: ordered.   Isolated injury of the left ankle.  X-rays ordered and personally reviewed interpreted with no acute fracture.  She is neurovascularly intact.  Suspect this is an ankle sprain.  Recommended rice therapy.  Okay for discharge        Final Clinical Impression(s) / ED Diagnoses Final diagnoses:  Sprain of left ankle, unspecified ligament, initial encounter    Rx / DC Orders ED Discharge Orders     None         Wyvonnia Dusky, MD 12/15/21 2039

## 2021-12-15 NOTE — ED Triage Notes (Signed)
Pt presents to ED POV. Pt reports L ankle pain. Pt reports that she was walking and tripped on shoe. Subsequent fall. No head injury. Pt reports hx of injury to same ankle. Pt ambulatory to triage.

## 2022-01-12 ENCOUNTER — Emergency Department (HOSPITAL_COMMUNITY): Payer: BC Managed Care – PPO

## 2022-01-12 ENCOUNTER — Other Ambulatory Visit: Payer: Self-pay

## 2022-01-12 ENCOUNTER — Encounter (HOSPITAL_COMMUNITY): Payer: Self-pay

## 2022-01-12 ENCOUNTER — Emergency Department (HOSPITAL_COMMUNITY)
Admission: EM | Admit: 2022-01-12 | Discharge: 2022-01-12 | Disposition: A | Discharge status: Home / Self Care | Payer: BC Managed Care – PPO | Attending: Emergency Medicine | Admitting: Emergency Medicine

## 2022-01-12 DIAGNOSIS — R002 Palpitations: Secondary | ICD-10-CM | POA: Insufficient documentation

## 2022-01-12 LAB — BASIC METABOLIC PANEL
Anion gap: 10 (ref 5–15)
BUN: 10 mg/dL (ref 6–20)
CO2: 22 mmol/L (ref 22–32)
Calcium: 9.8 mg/dL (ref 8.9–10.3)
Chloride: 106 mmol/L (ref 98–111)
Creatinine, Ser: 0.8 mg/dL (ref 0.44–1.00)
GFR, Estimated: >60 mL/min (ref >60)
Glucose, Bld: 85 mg/dL (ref 70–99)
Potassium: 3.9 mmol/L (ref 3.5–5.1)
Sodium: 138 mmol/L (ref 135–145)

## 2022-01-12 LAB — T4, FREE: Free T4: 1.03 ng/dL (ref 0.61–1.12)

## 2022-01-12 LAB — I-STAT BETA HCG BLOOD, ED (MC, WL, AP ONLY): I-stat hCG, quantitative: <5 m[IU]/mL (ref <5)

## 2022-01-12 LAB — CBC WITH DIFFERENTIAL/PLATELET
Abs Immature Granulocytes: 0.01 10*3/uL (ref 0.00–0.07)
Basophils Absolute: 0 10*3/uL (ref 0.0–0.1)
Basophils Relative: 0 %
Eosinophils Absolute: 0.1 10*3/uL (ref 0.0–0.5)
Eosinophils Relative: 2 %
HCT: 40.5 % (ref 36.0–46.0)
Hemoglobin: 13.3 g/dL (ref 12.0–15.0)
Immature Granulocytes: 0 %
Lymphocytes Relative: 30 %
Lymphs Abs: 1.9 10*3/uL (ref 0.7–4.0)
MCH: 29.8 pg (ref 26.0–34.0)
MCHC: 32.8 g/dL (ref 30.0–36.0)
MCV: 90.8 fL (ref 80.0–100.0)
Monocytes Absolute: 0.5 10*3/uL (ref 0.1–1.0)
Monocytes Relative: 8 %
Neutro Abs: 3.7 10*3/uL (ref 1.7–7.7)
Neutrophils Relative %: 60 %
Platelets: 245 10*3/uL (ref 150–400)
RBC: 4.46 MIL/uL (ref 3.87–5.11)
RDW: 12.8 % (ref 11.5–15.5)
WBC: 6.2 10*3/uL (ref 4.0–10.5)
nRBC: 0 % (ref 0.0–0.2)

## 2022-01-12 LAB — MAGNESIUM: Magnesium: 1.8 mg/dL (ref 1.7–2.4)

## 2022-01-12 LAB — D-DIMER, QUANTITATIVE: D-Dimer, Quant: <0.27 ug/mL-FEU (ref 0.00–0.50)

## 2022-01-12 LAB — TSH: TSH: 0.487 u[IU]/mL (ref 0.350–4.500)

## 2022-01-12 NOTE — ED Triage Notes (Signed)
Pt BIB [facility] from home d/t palpitations that started last night. Hx of SVT during pregnancy. HR w EMS in the 150 highest. Not on any meds. No CP or SOB. A&O X4.   324 ASA given by EMS.   132/80 97%

## 2022-01-12 NOTE — ED Provider Notes (Signed)
[facility] EMERGENCY DEPARTMENT Provider Note   CSN: [id number] Arrival date & time: 01/12/22  1040     History  Chief Complaint  Patient presents with   Palpitations    Lindsey Stevenson is a 26 y.o. female.   Palpitations    26 year old female with medical history significant for SVT, migraine headaches, anxiety, depression who presents to the emergency department with palpitations.  Patient states that she was at home last night when she developed sudden onset of palpitations.  She denied any chest pain.  She did not endorse brief shortness of breath.  She endorse some tingling in her extremities.  She currently denies any chest pain or shortness of breath.  She underwent cardiac monitoring during her recent pregnancy (she is 3 months postpartum) and was diagnosed with SVT.  She is not on any medications currently.  She denies any recent lower extremity swelling, denies any history of DVT or PE.  Home Medications Prior to Admission medications   Medication Sig Start Date End Date Taking? Authorizing Provider  acetaminophen (TYLENOL) 500 MG tablet Take 500 mg by mouth every 6 (six) hours as needed for moderate pain.    [provider]  cholecalciferol (VITAMIN D3) 25 MCG (1000 UNIT) tablet Take 2,000 Units by mouth daily.    [provider]  ibuprofen (ADVIL) 600 MG tablet Take 1 tablet (600 mg total) by mouth every 6 (six) hours as needed for mild pain, moderate pain or cramping. 09/27/21   Drema Dallas, DO  methylcellulose oral powder Take 1 packet by mouth daily.    [provider]  Prenatal Vit-Fe Fumarate-FA (PRENATAL MULTIVITAMIN) TABS tablet Take 1 tablet by mouth daily at 12 noon.    [provider]      Allergies    Patient has no known allergies.    Review of Systems   Review of Systems  Cardiovascular:  Positive for palpitations.  All other systems reviewed and are negative.   Physical Exam Updated Vital  Signs BP 119/76   Pulse 71   Temp 98.4 F (36.9 C) (Oral)   Resp 20   SpO2 100%  Physical Exam Vitals and nursing note reviewed.  Constitutional:      General: She is not in acute distress.    Appearance: She is well-developed.  HENT:     Head: Normocephalic and atraumatic.  Eyes:     Conjunctiva/sclera: Conjunctivae normal.  Cardiovascular:     Rate and Rhythm: Normal rate and regular rhythm.     Pulses: Normal pulses.  Pulmonary:     Effort: Pulmonary effort is normal. No respiratory distress.     Breath sounds: Normal breath sounds.  Abdominal:     Palpations: Abdomen is soft.     Tenderness: There is no abdominal tenderness.  Musculoskeletal:        General: No swelling.     Cervical back: Neck supple.     Right lower leg: No edema.     Left lower leg: No edema.  Skin:    General: Skin is warm and dry.     Capillary Refill: Capillary refill takes less than 2 seconds.  Neurological:     General: No focal deficit present.     Mental Status: She is alert and oriented to person, place, and time. Mental status is at baseline.  Psychiatric:        Mood and Affect: Mood normal.     ED Results / Procedures / Treatments  Labs (all labs ordered are listed, but only abnormal results are displayed) Labs Reviewed  CBC WITH DIFFERENTIAL/PLATELET  BASIC METABOLIC PANEL  MAGNESIUM  TSH  T4, FREE  D-DIMER, QUANTITATIVE  I-STAT BETA HCG BLOOD, ED ([facility], [facility], [facility] ONLY)    EKG None  Radiology DG Chest Portable 1 View  Result Date: 01/12/2022 CLINICAL DATA:  Shortness of breath EXAM: PORTABLE CHEST 1 VIEW COMPARISON:  None Available. FINDINGS: The heart size and mediastinal contours are within normal limits. Both lungs are clear. The visualized skeletal structures are unremarkable. IMPRESSION: No active disease. Electronically Signed   By: Zerita Boers M.D.   On: 01/12/2022 12:02    Procedures Procedures    Medications Ordered in ED Medications - No data to  display  ED Course/ Medical Decision Making/ A&P                           Medical Decision Making Amount and/or Complexity of Data Reviewed Labs: ordered. Radiology: ordered.     26 year old female with medical history significant for SVT, migraine headaches, anxiety, depression who presents to the emergency department with palpitations.  Patient states that she was at home last night when she developed sudden onset of palpitations.  She denied any chest pain.  She did not endorse brief shortness of breath.  She endorse some tingling in her extremities.  She currently denies any chest pain or shortness of breath.  She underwent cardiac monitoring during her recent pregnancy (she is 3 months postpartum) and was diagnosed with SVT.  She is not on any medications currently.  She denies any recent lower extremity swelling, denies any history of DVT or PE.  On arrival, the patient was vitally stable, afebrile, not tachycardic or tachypneic, normotensive, saturating well on room air.  Sinus rhythm noted on cardiac telemetry.  Physical exam generally unremarkable.  Differential diagnosis includes SVT, anxiety, electrolyte abnormality, PE.  Initial EKG revealed sinus rhythm, ventricular rate 76, benign early repolarization noted unchanged from prior EKGs.  No other abnormalities noted with normal intervals.  Chest x-ray was performed which revealed no acute cardiac or pulm abnormality.  Laboratory evaluation significant for D-dimer normal, TSH and free T4 normal, BMP without significant electrolyte abnormality, normal renal function, i-STAT hCG normal, magnesium normal and CBC without leukocytosis or anemia.  Given this reassuring work-up, stability on cardiac telemetry, reassuring EKG, feel the patient is stable for continued outpatient management.  Recommended the patient follow-up with her PCP for consideration for cardiac monitor placement for further evaluation outpatient.  Final Clinical  Impression(s) / ED Diagnoses Final diagnoses:  Palpitations    Rx / DC Orders ED Discharge Orders     None         Regan Lemming, MD 01/12/22 1228

## 2022-01-12 NOTE — Discharge Instructions (Addendum)
Schedule an appointment for follow-up with your PCP for consideration for cardiac monitor placement. Your EKG, CXR, and laboratory workup today was reassuring.

## 2022-02-21 ENCOUNTER — Emergency Department (HOSPITAL_BASED_OUTPATIENT_CLINIC_OR_DEPARTMENT_OTHER)
Admission: EM | Admit: 2022-02-21 | Discharge: 2022-02-21 | Disposition: A | Discharge status: Home / Self Care | Payer: BC Managed Care – PPO | Attending: Emergency Medicine | Admitting: Emergency Medicine

## 2022-02-21 ENCOUNTER — Encounter (HOSPITAL_BASED_OUTPATIENT_CLINIC_OR_DEPARTMENT_OTHER): Payer: Self-pay

## 2022-02-21 ENCOUNTER — Other Ambulatory Visit: Payer: Self-pay

## 2022-02-21 DIAGNOSIS — R1031 Right lower quadrant pain: Secondary | ICD-10-CM | POA: Diagnosis present

## 2022-02-21 DIAGNOSIS — R102 Pelvic and perineal pain: Secondary | ICD-10-CM | POA: Insufficient documentation

## 2022-02-21 DIAGNOSIS — R110 Nausea: Secondary | ICD-10-CM | POA: Diagnosis not present

## 2022-02-21 LAB — URINALYSIS, ROUTINE W REFLEX MICROSCOPIC
Bilirubin Urine: NEGATIVE
Glucose, UA: NEGATIVE mg/dL
Ketones, ur: NEGATIVE mg/dL
Nitrite: NEGATIVE
RBC / HPF: >50 RBC/hpf — ABNORMAL HIGH (ref 0–5)
Specific Gravity, Urine: 1.038 — ABNORMAL HIGH (ref 1.005–1.030)
pH: 6 (ref 5.0–8.0)

## 2022-02-21 LAB — COMPREHENSIVE METABOLIC PANEL
ALT: 15 U/L (ref 0–44)
AST: 15 U/L (ref 15–41)
Albumin: 4.5 g/dL (ref 3.5–5.0)
Alkaline Phosphatase: 61 U/L (ref 38–126)
Anion gap: 8 (ref 5–15)
BUN: 13 mg/dL (ref 6–20)
CO2: 28 mmol/L (ref 22–32)
Calcium: 9.9 mg/dL (ref 8.9–10.3)
Chloride: 105 mmol/L (ref 98–111)
Creatinine, Ser: 0.83 mg/dL (ref 0.44–1.00)
GFR, Estimated: >60 mL/min (ref >60)
Glucose, Bld: 89 mg/dL (ref 70–99)
Potassium: 4.1 mmol/L (ref 3.5–5.1)
Sodium: 141 mmol/L (ref 135–145)
Total Bilirubin: 0.5 mg/dL (ref 0.3–1.2)
Total Protein: 7.4 g/dL (ref 6.5–8.1)

## 2022-02-21 LAB — CBC
HCT: 40.6 % (ref 36.0–46.0)
Hemoglobin: 13.6 g/dL (ref 12.0–15.0)
MCH: 30.3 pg (ref 26.0–34.0)
MCHC: 33.5 g/dL (ref 30.0–36.0)
MCV: 90.4 fL (ref 80.0–100.0)
Platelets: 244 10*3/uL (ref 150–400)
RBC: 4.49 MIL/uL (ref 3.87–5.11)
RDW: 12.4 % (ref 11.5–15.5)
WBC: 6.5 10*3/uL (ref 4.0–10.5)
nRBC: 0 % (ref 0.0–0.2)

## 2022-02-21 LAB — PREGNANCY, URINE: Preg Test, Ur: NEGATIVE

## 2022-02-21 MED ORDER — NAPROXEN 500 MG PO TABS: 500.0000 mg | ORAL_TABLET | Freq: Two times a day (BID) | ORAL | 0 refills | Status: DC

## 2022-02-21 MED ORDER — ACETAMINOPHEN 500 MG PO TABS
1000.0000 mg | ORAL_TABLET | Freq: Once | ORAL | Status: AC
  Administered 2022-02-21: 1000 mg via ORAL
  Filled 2022-02-21: qty 2

## 2022-02-21 NOTE — ED Provider Notes (Signed)
[facility] Emergency Department Provider Note MRN:  [id number]  Arrival date & time: 02/21/22     Chief Complaint   Abdominal Pain and Nausea   History of Present Illness   Lindsey Stevenson is a 26 y.o. year-old female with no pertinent PMH presenting to the ED with chief complaint of abdominal pain.  Several months of increased abdominal pain.  RLQ and right pelvic pain with radiation to the right hip on occasion.  She has been having pain in this area on and off for years but it got worse during her recent pregnancy.  She underwent an ultrasound during the pregnancy but was told that the pregnancy obscured the images.  Her OB thinks maybe she has fibroids or a cyst on the right side on her ovary.  Pain worse throughout the night, trouble sleeping, here for evaluation and/or pain control.  She denies vaginal bleeding or discharge, no fever, mild nausea, no vomiting, no diarrhea.  Review of Systems  A thorough review of systems was obtained and all systems are negative except as noted in the HPI and PMH.   Patient's Health History    Past Medical History:  Diagnosis Date   Anxiety    Depression    Migraine    SVT (supraventricular tachycardia)    SVT (supraventricular tachycardia)    Vaginal Pap smear, abnormal 2019   HPV    Past Surgical History:  Procedure Laterality Date   NO PAST SURGERIES      Family History  Problem Relation Age of Onset   Cancer Maternal Grandmother    Arthritis Maternal Grandmother    Cancer Maternal Grandfather    Hypertension Paternal Grandmother    Anxiety disorder Mother    Hypertension Mother    Hypertension Father    Asthma Brother    Diabetes Brother    Early death Sister     Social History   Socioeconomic History   Marital status: Married    Spouse name: Jared   Number of children: Not on file   Years of education: Not on file   Highest education level: Not on file  Occupational History   Not on file   Tobacco Use   Smoking status: Never   Smokeless tobacco: Never  Vaping Use   Vaping Use: Never used  Substance and Sexual Activity   Alcohol use: Not Currently   Drug use: Never   Sexual activity: Yes    Birth control/protection: None  Other Topics Concern   Not on file  Social History Narrative   Not on file   Social Determinants of Health   Financial Resource Strain: Not on file  Food Insecurity: Not on file  Transportation Needs: Not on file  Physical Activity: Not on file  Stress: Not on file  Social Connections: Not on file  Intimate Partner Violence: Not on file     Physical Exam   Vitals:   02/21/22 0525 02/21/22 0530  BP: 124/84 122/78  Pulse: 95 88  Resp: 18 18  Temp: 98.2 F (36.8 C)   SpO2: 99% 98%    CONSTITUTIONAL: Well-appearing, NAD NEURO/PSYCH:  Alert and oriented x 3, no focal deficits EYES:  eyes equal and reactive ENT/NECK:  no LAD, no JVD CARDIO: Regular rate, well-perfused, normal S1 and S2 PULM:  CTAB no wheezing or rhonchi GI/GU:  non-distended, non-tender MSK/SPINE:  No gross deformities, no edema SKIN:  no rash, atraumatic   *Additional and/or pertinent findings included in MDM below  Diagnostic and Interventional Summary    EKG Interpretation  Date/Time:    Ventricular Rate:    PR Interval:    QRS Duration:   QT Interval:    QTC Calculation:   R Axis:     Text Interpretation:         Labs Reviewed  URINALYSIS, ROUTINE W REFLEX MICROSCOPIC - Abnormal; Notable for the following components:      Result Value   APPearance HAZY (*)    Specific Gravity, Urine 1.038 (*)    Hgb urine dipstick MODERATE (*)    Protein, ur TRACE (*)    Leukocytes,Ua SMALL (*)    RBC / HPF >50 (*)    Bacteria, UA RARE (*)    All other components within normal limits  URINE CULTURE  CBC  COMPREHENSIVE METABOLIC PANEL  PREGNANCY, URINE    No orders to display    Medications  acetaminophen (TYLENOL) tablet 1,000 mg (1,000 mg Oral Given  02/21/22 0544)     Procedures  /  Critical Care Procedures  ED Course and Medical Decision Making  Initial Impression and Ddx Suspect ovarian cyst or fibroid related pain.  Highly doubt appendicitis given the chronicity.  Abdomen soft and nontender with no rebound guarding or rigidity.  Vital signs reassuring.  Other considerations include UTI, ectopic pregnancy.  Awaiting screening labs, urinalysis, hCG.  Past medical/surgical history that increases complexity of ED encounter: None  Interpretation of Diagnostics I personally reviewed the laboratory assessment and my interpretation is as follows: No blood count or electrolyte disturbance, blood in the urine of unclear significance, sending for culture.  hCG negative.    Patient Reassessment and Ultimate Disposition/Management     Patient feeling a lot better after Tylenol, appropriate for discharge with OB/GYN follow-up.  Patient management required discussion with the following services or consulting groups:  None  Complexity of Problems Addressed Acute illness or injury that poses threat of life of bodily function  Additional Data Reviewed and Analyzed Further history obtained from: None  Additional Factors Impacting ED Encounter Risk Prescriptions  Barth Kirks. [place] Small, [place] [email or web address]  Final Clinical Impressions(s) / ED Diagnoses     ICD-10-CM   1. Pelvic pain in female  R10.2       ED Discharge Orders          Ordered     02/21/22 0634     02/21/22 0634    naproxen (NAPROSYN) 500 MG tablet  2 times daily        02/21/22 9811             Discharge Instructions Discussed with and Provided to Patient:     Discharge Instructions      You were evaluated in the Emergency Department and after careful evaluation, we did not find any emergent condition requiring admission or further testing in the hospital.  Your exam/testing today is overall  reassuring.  Recommend follow-up with your OB/GYN to discuss her symptoms, use Tylenol 1000 mg every 4-6 hours for pain.  Can use the Naprosyn medication provided as well for pain.  If breast-feeding, cannot use the Naprosyn medication greater than 7 days.  Please return to the Emergency Department if you experience any worsening of your condition.   Thank you for allowing [place] to be a part of your care.       Maudie Flakes, MD 02/21/22 [phone]

## 2022-02-21 NOTE — Discharge Instructions (Signed)
You were evaluated in the Emergency Department and after careful evaluation, we did not find any emergent condition requiring admission or further testing in the hospital.  Your exam/testing today is overall reassuring.  Recommend follow-up with your OB/GYN to discuss her symptoms, use Tylenol 1000 mg every 4-6 hours for pain.  Can use the Naprosyn medication provided as well for pain.  If breast-feeding, cannot use the Naprosyn medication greater than 7 days.  Please return to the Emergency Department if you experience any worsening of your condition.   Thank you for allowing [place] to be a part of your care.

## 2022-02-21 NOTE — ED Triage Notes (Signed)
POV, pt c/o RLQ pain that radiates to right groin and right hip with associated nausea that began approx 0430 but has been intermittently happening x 5 yrs tonight it was worse. Pt had baby 107mhs ago with normal vaginal delivery. Amb, A&O x 4.

## 2022-02-21 NOTE — ED Triage Notes (Signed)
Hx of fibroids

## 2022-02-21 NOTE — ED Notes (Signed)
Pt agreable with d/c plan as discussed by provider- this nurse has verbally reinforced d/c instructions and provided pt with written copy.  Pt acknowledges verbal understanding and denies any additional questions, concerns, needs- pt ambulatory independently at d/c with steady gait; vitals stable; no distress

## 2022-02-22 LAB — URINE CULTURE: Culture: NO GROWTH

## 2022-02-26 ENCOUNTER — Encounter (HOSPITAL_BASED_OUTPATIENT_CLINIC_OR_DEPARTMENT_OTHER): Payer: Self-pay

## 2022-02-26 ENCOUNTER — Other Ambulatory Visit: Payer: Self-pay

## 2022-02-26 ENCOUNTER — Emergency Department (HOSPITAL_BASED_OUTPATIENT_CLINIC_OR_DEPARTMENT_OTHER): Payer: BC Managed Care – PPO

## 2022-02-26 ENCOUNTER — Emergency Department (HOSPITAL_BASED_OUTPATIENT_CLINIC_OR_DEPARTMENT_OTHER)
Admission: EM | Admit: 2022-02-26 | Discharge: 2022-02-27 | Disposition: A | Discharge status: Home / Self Care | Payer: BC Managed Care – PPO | Attending: Emergency Medicine | Admitting: Emergency Medicine

## 2022-02-26 DIAGNOSIS — R3121 Asymptomatic microscopic hematuria: Secondary | ICD-10-CM | POA: Diagnosis not present

## 2022-02-26 DIAGNOSIS — R1031 Right lower quadrant pain: Secondary | ICD-10-CM | POA: Insufficient documentation

## 2022-02-26 LAB — COMPREHENSIVE METABOLIC PANEL
ALT: 13 U/L (ref 0–44)
AST: 14 U/L — ABNORMAL LOW (ref 15–41)
Albumin: 4.8 g/dL (ref 3.5–5.0)
Alkaline Phosphatase: 64 U/L (ref 38–126)
Anion gap: 10 (ref 5–15)
BUN: 14 mg/dL (ref 6–20)
CO2: 27 mmol/L (ref 22–32)
Calcium: 9.8 mg/dL (ref 8.9–10.3)
Chloride: 102 mmol/L (ref 98–111)
Creatinine, Ser: 0.82 mg/dL (ref 0.44–1.00)
GFR, Estimated: >60 mL/min (ref >60)
Glucose, Bld: 92 mg/dL (ref 70–99)
Potassium: 3.9 mmol/L (ref 3.5–5.1)
Sodium: 139 mmol/L (ref 135–145)
Total Bilirubin: 0.5 mg/dL (ref 0.3–1.2)
Total Protein: 8 g/dL (ref 6.5–8.1)

## 2022-02-26 LAB — CBC
HCT: 42 % (ref 36.0–46.0)
Hemoglobin: 14 g/dL (ref 12.0–15.0)
MCH: 30.4 pg (ref 26.0–34.0)
MCHC: 33.3 g/dL (ref 30.0–36.0)
MCV: 91.3 fL (ref 80.0–100.0)
Platelets: 275 10*3/uL (ref 150–400)
RBC: 4.6 MIL/uL (ref 3.87–5.11)
RDW: 12.4 % (ref 11.5–15.5)
WBC: 8.4 10*3/uL (ref 4.0–10.5)
nRBC: 0 % (ref 0.0–0.2)

## 2022-02-26 LAB — URINALYSIS, ROUTINE W REFLEX MICROSCOPIC
Bilirubin Urine: NEGATIVE
Glucose, UA: NEGATIVE mg/dL
Ketones, ur: NEGATIVE mg/dL
Nitrite: NEGATIVE
RBC / HPF: >50 RBC/hpf — ABNORMAL HIGH (ref 0–5)
Specific Gravity, Urine: 1.032 — ABNORMAL HIGH (ref 1.005–1.030)
pH: 6.5 (ref 5.0–8.0)

## 2022-02-26 LAB — PREGNANCY, URINE: Preg Test, Ur: NEGATIVE

## 2022-02-26 LAB — LIPASE, BLOOD: Lipase: 28 U/L (ref 11–51)

## 2022-02-26 MED ORDER — IOHEXOL 300 MG/ML  SOLN
100.0000 mL | Freq: Once | INTRAMUSCULAR | Status: AC | PRN
  Administered 2022-02-26: 80 mL via INTRAVENOUS

## 2022-02-26 NOTE — ED Triage Notes (Addendum)
Patient here POV from Home.  Endorses RLQ ABD Pain that began 9 Months ago. Worsening since it began.   Seen 5 Days ago for same. Referred to OB and saw same today. Had Pelvic Exam completed and is scheduled for an [place]. Returns today due to continued Pain and Nausea.  NAD Noted during Triage. A&Ox4. GCS 15. Ambulatory.

## 2022-02-26 NOTE — ED Provider Notes (Signed)
[facility] Provider Note: Georgena Spurling, MD, FACEP  CSN: [id number] MRN: [id number] ARRIVAL: 02/26/22 at 2113 ROOM: DB003/DB003   CHIEF COMPLAINT  Abdominal Pain   HISTORY OF PRESENT ILLNESS  02/26/22 11:08 PM Lindsey Stevenson is a 26 y.o. female who has had right lower quadrant abdominal pain for about the past 9 months.  It began while she was pregnant and she gave birth about 4 months ago.  The pain has gradually worsened.  It is in the deep right lower quadrant and radiates around to her right buttock.  She was seen for this on 02/21/2022 and was noted to have blood in your urine but no imaging was done at that time.  She was seen by her OB/GYN earlier today and is planned for a pelvic ultrasound but 1 has not yet been done.  She describes the pain as aching and sharp and somewhat worse with movement.  She is not aware of hematuria but her microscopic urinalysis shows greater than 50 red cells/HPF without pyuria or bacteriuria.   Past Medical History:  Diagnosis Date   Anxiety    Depression    Migraine    SVT (supraventricular tachycardia)    Vaginal Pap smear, abnormal 2019   HPV    Past Surgical History:  Procedure Laterality Date   NO PAST SURGERIES      Family History  Problem Relation Age of Onset   Cancer Maternal Grandmother    Arthritis Maternal Grandmother    Cancer Maternal Grandfather    Hypertension Paternal Grandmother    Anxiety disorder Mother    Hypertension Mother    Hypertension Father    Asthma Brother    Diabetes Brother    Early death Sister     Social History   Tobacco Use   Smoking status: Never   Smokeless tobacco: Never  Vaping Use   Vaping Use: Never used  Substance Use Topics   Alcohol use: Not Currently   Drug use: Never    Prior to Admission medications   Medication Sig Start Date End Date Taking? Authorizing Provider  acetaminophen (TYLENOL) 500 MG tablet Take 500 mg by mouth every 6 (six) hours as needed for  moderate pain.    [provider]  cholecalciferol (VITAMIN D3) 25 MCG (1000 UNIT) tablet Take 2,000 Units by mouth daily.    [provider]  ibuprofen (ADVIL) 600 MG tablet Take 1 tablet (600 mg total) by mouth every 6 (six) hours as needed for mild pain, moderate pain or cramping. 09/27/21   Drema Dallas, DO  methylcellulose oral powder Take 1 packet by mouth daily.    [provider]  naproxen (NAPROSYN) 500 MG tablet Take 1 tablet (500 mg total) by mouth 2 (two) times daily. 02/21/22   Maudie Flakes, MD  Prenatal Vit-Fe Fumarate-FA (PRENATAL MULTIVITAMIN) TABS tablet Take 1 tablet by mouth daily at 12 noon.    [provider]    Allergies Patient has no known allergies.   REVIEW OF SYSTEMS  Negative except as noted here or in the History of Present Illness.   PHYSICAL EXAMINATION  Initial Vital Signs Blood pressure (!) 144/89, pulse (!) 103, temperature 97.7 F (36.5 C), resp. rate 18, height '5\' 7"'$  (1.702 m), weight 79.8 kg, SpO2 99 %, currently breastfeeding.  Examination General: Well-developed, well-nourished female in no acute distress; appearance consistent with age of record HENT: normocephalic; atraumatic Eyes: Normal appearance Neck: supple Heart: regular rate and rhythm Lungs: clear to  auscultation bilaterally Abdomen: soft; nondistended; right lower quadrant tenderness just above the groin fold; bowel sounds present Extremities: No deformity; full range of motion; pulses normal Neurologic: Awake, alert and oriented; motor function intact in all extremities and symmetric; no facial droop Skin: Warm and dry Psychiatric: Normal mood and affect   RESULTS  Summary of this visit's results, reviewed and interpreted by myself:   EKG Interpretation  Date/Time:    Ventricular Rate:    PR Interval:    QRS Duration:   QT Interval:    QTC Calculation:   R Axis:     Text Interpretation:         Laboratory Studies: Results  for orders placed or performed during the hospital encounter of 02/26/22 (from the past 24 hour(s))  Lipase, blood     Status: None   Collection Time: 02/26/22  9:30 PM  Result Value Ref Range   Lipase 28 11 - 51 U/L  Comprehensive metabolic panel     Status: Abnormal   Collection Time: 02/26/22  9:30 PM  Result Value Ref Range   Sodium 139 135 - 145 mmol/L   Potassium 3.9 3.5 - 5.1 mmol/L   Chloride 102 98 - 111 mmol/L   CO2 27 22 - 32 mmol/L   Glucose, Bld 92 70 - 99 mg/dL   BUN 14 6 - 20 mg/dL   Creatinine, Ser 0.82 0.44 - 1.00 mg/dL   Calcium 9.8 8.9 - 10.3 mg/dL   Total Protein 8.0 6.5 - 8.1 g/dL   Albumin 4.8 3.5 - 5.0 g/dL   AST 14 (L) 15 - 41 U/L   ALT 13 0 - 44 U/L   Alkaline Phosphatase 64 38 - 126 U/L   Total Bilirubin 0.5 0.3 - 1.2 mg/dL   GFR, Estimated >60 >60 mL/min   Anion gap 10 5 - 15  CBC     Status: None   Collection Time: 02/26/22  9:30 PM  Result Value Ref Range   WBC 8.4 4.0 - 10.5 K/uL   RBC 4.60 3.87 - 5.11 MIL/uL   Hemoglobin 14.0 12.0 - 15.0 g/dL   HCT 42.0 36.0 - 46.0 %   MCV 91.3 80.0 - 100.0 fL   MCH 30.4 26.0 - 34.0 pg   MCHC 33.3 30.0 - 36.0 g/dL   RDW 12.4 11.5 - 15.5 %   Platelets 275 150 - 400 K/uL   nRBC 0.0 0.0 - 0.2 %  Urinalysis, Routine w reflex microscopic Urine, Clean Catch     Status: Abnormal   Collection Time: 02/26/22  9:30 PM  Result Value Ref Range   Color, Urine YELLOW YELLOW   APPearance CLEAR CLEAR   Specific Gravity, Urine 1.032 (H) 1.005 - 1.030   pH 6.5 5.0 - 8.0   Glucose, UA NEGATIVE NEGATIVE mg/dL   Hgb urine dipstick MODERATE (A) NEGATIVE   Bilirubin Urine NEGATIVE NEGATIVE   Ketones, ur NEGATIVE NEGATIVE mg/dL   Protein, ur TRACE (A) NEGATIVE mg/dL   Nitrite NEGATIVE NEGATIVE   Leukocytes,Ua TRACE (A) NEGATIVE   RBC / HPF >50 (H) 0 - 5 RBC/hpf   WBC, UA 0-5 0 - 5 WBC/hpf   Squamous Epithelial / LPF 0-5 0 - 5   Mucus PRESENT   Pregnancy, urine     Status: None   Collection Time: 02/26/22  9:30 PM   Result Value Ref Range   Preg Test, Ur NEGATIVE NEGATIVE   Imaging Studies: No results found.  ED COURSE and MDM  Nursing notes, initial and subsequent vitals signs, including pulse oximetry, reviewed and interpreted by myself.  Vitals:   02/26/22 2127 02/26/22 2128  BP: (!) 144/89   Pulse: (!) 103   Resp: 18   Temp: 97.7 F (36.5 C)   SpO2: 99%   Weight:  79.8 kg  Height:  '5\' 7"'$  (1.702 m)   Medications - No data to display    PROCEDURES  Procedures   ED DIAGNOSES  No diagnosis found.

## 2022-04-26 ENCOUNTER — Ambulatory Visit: Payer: BC Managed Care – PPO | Admitting: Cardiology

## 2022-05-22 NOTE — Progress Notes (Unsigned)
Cardiology Clinic Note   Patient Name: Lindsey Stevenson Date of Encounter: 05/23/2022  Primary Care Provider:  Kristen Loader, FNP Primary Cardiologist:  Janina Mayo, MD  Patient Profile    Lindsey Stevenson 27 year old female presents the clinic today for follow-up evaluation of her palpitations.  Past Medical History    Past Medical History:  Diagnosis Date   Anxiety    Depression    Migraine    SVT (supraventricular tachycardia)    Vaginal Pap smear, abnormal 2019   HPV   Past Surgical History:  Procedure Laterality Date   NO PAST SURGERIES      Allergies  No Known Allergies  History of Present Illness    Lindsey Stevenson is a PMH of PSVT, obesity, and hypertension.  Echocardiogram 03/15/2022 showed an EF of 60-65%, moderately dilated left atria and trivial mitral valve regurgitation.  She was seen in follow-up by Dr. Harriet Masson on 11/02/2021.  During that time she reported that she was doing okay.  She was seen via video visit.  Patient reported that she was breast-feeding and had gone back to work.  Her blood pressure was noted to be 141/84.  She was instructed to monitor her blood pressure for 2 weeks and report via MyChart.  She denied symptoms of SVT.  Follow-up was planned for 8 weeks.  She presents to the clinic today for follow-up evaluation and states her blood pressure has been well-controlled at home.  Her blood pressure today is noted to be 115/76.  She delivered her second baby boy 09/26/2021.  We reviewed her previous echocardiogram and cardiac event monitor.  She expressed understanding.  She does note occasional palpitations.  We reviewed the importance of avoiding triggers for palpitations and maintaining hydration.  She is somewhat limited in hydration through the week.  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  She also notes increased palpitations with decreased sleep.  I will give her sleep hygiene instructions,  have her increase her physical activity as tolerated, have her increase her p.o. hydration plan follow-up in around 4 months.  Today she denies chest pain, shortness of breath.   Home Medications    Prior to Admission medications   Medication Sig Start Date End Date Taking? Authorizing Provider  acetaminophen (TYLENOL) 500 MG tablet Take 500 mg by mouth every 6 (six) hours as needed for moderate pain.    [provider]  cholecalciferol (VITAMIN D3) 25 MCG (1000 UNIT) tablet Take 2,000 Units by mouth daily.    [provider]  ibuprofen (ADVIL) 600 MG tablet Take 1 tablet (600 mg total) by mouth every 6 (six) hours as needed for mild pain, moderate pain or cramping. 09/27/21   Drema Dallas, DO  methylcellulose oral powder Take 1 packet by mouth daily.    [provider]  naproxen (NAPROSYN) 500 MG tablet Take 1 tablet (500 mg total) by mouth 2 (two) times daily. 02/21/22   Maudie Flakes, MD  Prenatal Vit-Fe Fumarate-FA (PRENATAL MULTIVITAMIN) TABS tablet Take 1 tablet by mouth daily at 12 noon.    [provider]    Family History    Family History  Problem Relation Age of Onset   Cancer Maternal Grandmother    Arthritis Maternal Grandmother    Cancer Maternal Grandfather    Hypertension Paternal Grandmother    Anxiety disorder Mother    Hypertension Mother    Hypertension Father    Asthma  Brother    Diabetes Brother    Early death Sister    She indicated that her mother is alive. She indicated that her father is alive. She indicated that only one of her two sisters is alive. She indicated that her brother is alive. She indicated that her maternal grandmother is alive. She indicated that her maternal grandfather is deceased. She indicated that her paternal grandmother is alive. She indicated that her paternal grandfather is alive. She indicated that her maternal aunt is alive. She indicated that her maternal uncle is alive. She indicated that  her paternal aunt is alive. She indicated that her paternal uncle is alive.  Social History    Social History   Socioeconomic History   Marital status: Married    Spouse name: Jared   Number of children: Not on file   Years of education: Not on file   Highest education level: Not on file  Occupational History   Not on file  Tobacco Use   Smoking status: Never   Smokeless tobacco: Never  Vaping Use   Vaping Use: Never used  Substance and Sexual Activity   Alcohol use: Not Currently   Drug use: Never   Sexual activity: Yes    Birth control/protection: None  Other Topics Concern   Not on file  Social History Narrative   Not on file   Social Determinants of Health   Financial Resource Strain: Not on file  Food Insecurity: Not on file  Transportation Needs: Not on file  Physical Activity: Not on file  Stress: Not on file  Social Connections: Not on file  Intimate Partner Violence: Not on file     Review of Systems    General:  No chills, fever, night sweats or weight changes.  Cardiovascular:  No chest pain, dyspnea on exertion, edema, orthopnea, palpitations, paroxysmal nocturnal dyspnea. Dermatological: No rash, lesions/masses Respiratory: No cough, dyspnea Urologic: No hematuria, dysuria Abdominal:   No nausea, vomiting, diarrhea, bright red blood per rectum, melena, or hematemesis Neurologic:  No visual changes, wkns, changes in mental status. All other systems reviewed and are otherwise negative except as noted above.  Physical Exam    VS:  BP 115/76   Pulse 69   Ht 5' 7"$  (1.702 m)   Wt 174 lb (78.9 kg)   BMI 27.25 kg/m  , BMI Body mass index is 27.25 kg/m. GEN: Well nourished, well developed, in no acute distress. HEENT: normal. Neck: Supple, no JVD, carotid bruits, or masses. Cardiac: RRR, no murmurs, rubs, or gallops. No clubbing, cyanosis, edema.  Radials/DP/PT 2+ and equal bilaterally.  Respiratory:  Respirations regular and unlabored, clear to  auscultation bilaterally. GI: Soft, nontender, nondistended, BS + x 4. MS: no deformity or atrophy. Skin: warm and dry, no rash. Neuro:  Strength and sensation are intact. Psych: Normal affect.  Accessory Clinical Findings    Recent Labs: 01/12/2022: Magnesium 1.8; TSH 0.487 02/26/2022: ALT 13; BUN 14; Creatinine, Ser 0.82; Hemoglobin 14.0; Platelets 275; Potassium 3.9; Sodium 139   Recent Lipid Panel No results found for: "CHOL", "TRIG", "HDL", "CHOLHDL", "VLDL", "LDLCALC", "LDLDIRECT"       ECG personally reviewed by me today-none today.  Changes  Echocardiogram 03/15/2021  IMPRESSIONS     1. Left ventricular ejection fraction, by estimation, is 60 to 65%. The  left ventricle has normal function. The left ventricle has no regional  wall motion abnormalities. Left ventricular diastolic parameters were  normal.   2. Right ventricular systolic function is  normal. The right ventricular  size is normal. There is normal pulmonary artery systolic pressure.   3. Left atrial size was moderately dilated.   4. The mitral valve is normal in structure. Trivial mitral valve  regurgitation. No evidence of mitral stenosis.   5. The aortic valve is tricuspid. Aortic valve regurgitation is not  visualized. No aortic stenosis is present.   6. The inferior vena cava is normal in size with greater than 50%  respiratory variability, suggesting right atrial pressure of 3 mmHg.   FINDINGS   Left Ventricle: Left ventricular ejection fraction, by estimation, is 60  to 65%. The left ventricle has normal function. The left ventricle has no  regional wall motion abnormalities. The left ventricular internal cavity  size was normal in size. There is   no left ventricular hypertrophy. Left ventricular diastolic parameters  were normal.   Right Ventricle: The right ventricular size is normal. No increase in  right ventricular wall thickness. Right ventricular systolic function is  normal. There is  normal pulmonary artery systolic pressure. The tricuspid  regurgitant velocity is 2.36 m/s, and   with an assumed right atrial pressure of 3 mmHg, the estimated right  ventricular systolic pressure is 99991111 mmHg.   Left Atrium: Left atrial size was moderately dilated.   Right Atrium: Right atrial size was normal in size.   Pericardium: There is no evidence of pericardial effusion.   Mitral Valve: The mitral valve is normal in structure. Trivial mitral  valve regurgitation. No evidence of mitral valve stenosis.   Tricuspid Valve: The tricuspid valve is normal in structure. Tricuspid  valve regurgitation is trivial. No evidence of tricuspid stenosis.   Aortic Valve: The aortic valve is tricuspid. Aortic valve regurgitation is  not visualized. No aortic stenosis is present. Aortic valve mean gradient  measures 5.0 mmHg. Aortic valve peak gradient measures 10.0 mmHg. Aortic  valve area, by VTI measures 2.14   cm.   Pulmonic Valve: The pulmonic valve was normal in structure. Pulmonic valve  regurgitation is trivial. No evidence of pulmonic stenosis.   Aorta: The aortic root is normal in size and structure.   Venous: The inferior vena cava is normal in size with greater than 50%  respiratory variability, suggesting right atrial pressure of 3 mmHg.   IAS/Shunts: No atrial level shunt detected by color flow Doppler.    Cardiac event monitor 04/23/2021  Predominantly sinus rhythm with PACs. No significant tachyarrhythmia or bradyarrhythmia. No atrial fibrillation or flutter.    Assessment & Plan   1.  Essential hypertension-BP today 115/76 Maintain blood pressure log. Heart healthy low-sodium diet Increase physical activity as tolerated  Palpitations/PSVT-notes occasional episodes of brief palpitations.  Notices more palpitations around her menstrual cycle.  Reviewed the importance of staying hydrated.  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  Avoid triggers caffeine, chocolate, EtOH, dehydration etc. Increase physical activity as tolerated Sleep hygiene instructions given  Obesity-weight today 174 lbs.  Remains somewhat physically active.  Would like to start jogging again. Increase physical activity as tolerated Heart healthy low-sodium high-fiber diet  Disposition: Follow-up with Dr. Harl Bowie or me in 4  months.   Jossie Ng. Corneluis Allston NP-C     05/23/2022, 4:18 PM [place] [address] Office [phone] Fax [phone]    I spent 13 minutes examining this patient, reviewing medications, and using patient centered shared decision making involving her  cardiac care.  Prior to her visit I spent greater than 20 minutes reviewing her past medical history,  medications, and prior cardiac tests.

## 2022-05-23 ENCOUNTER — Encounter: Payer: Self-pay | Admitting: General Practice

## 2022-05-23 ENCOUNTER — Ambulatory Visit: Payer: BC Managed Care – PPO | Attending: Cardiology | Admitting: General Practice

## 2022-05-23 VITALS — BP 115/76 | HR 69 | Ht 67.0 in | Wt 174.0 lb

## 2022-05-23 DIAGNOSIS — Z719 Counseling, unspecified: Secondary | ICD-10-CM | POA: Diagnosis not present

## 2022-05-23 DIAGNOSIS — R030 Elevated blood-pressure reading, without diagnosis of hypertension: Secondary | ICD-10-CM

## 2022-05-23 DIAGNOSIS — I471 Supraventricular tachycardia, unspecified: Secondary | ICD-10-CM

## 2022-05-23 NOTE — Patient Instructions (Addendum)
Medication Instructions:  The current medical regimen is effective;  continue present plan and medications as directed. Please refer to the Current Medication list given to you today.  *If you need a refill on your cardiac medications before your next appointment, please call your pharmacy*  Lab Work: NONE If you have labs (blood work) drawn today and your tests are completely normal, you will receive your results only by: [facility]ena (if you have MyChart) OR A paper copy in the mail If you have any lab test that is abnormal or we need to change your treatment, we will call you to review the results.  Testing/Procedures: NONE  Follow-Up: At [facility], you and your health needs are our priority.  As part of our continuing mission to provide you with exceptional heart care, we have created designated Provider Care Teams.  These Care Teams include your primary Cardiologist (physician) and Advanced Practice Providers (APPs -  Physician Assistants and Nurse Practitioners) who all work together to provide you with the care you need, when you need it.  Your next appointment:   4 month(s)  Provider:   Janina Mayo, MD     Other Instructions OK TO START JOGGING LET'S START WITH 10 MINUTES TWICE WEEKLY   Please try to avoid these triggers: Do not use any products that have nicotine or tobacco in them. These include cigarettes, e-cigarettes, and chewing tobacco. If you need help quitting, ask your doctor. Eat heart-healthy foods. Talk with your doctor about the right eating plan for you. Exercise regularly as told by your doctor. Stay hydrated Do not drink alcohol, Caffeine or chocolate. Lose weight if you are overweight. Do not use drugs, including cannabis    Quality Sleep Information, Adult Quality sleep is important for your mental and physical health. It also improves your quality of life. Quality sleep means you: Are asleep for most of the time you are in bed. Fall  asleep within 30 minutes. Wake up no more than once a night. Are awake for no longer than 20 minutes if you do wake up during the night. Most adults need 7-8 hours of quality sleep each night. How can poor sleep affect me? If you do not get enough quality sleep, you may have: Mood swings. Daytime sleepiness. Decreased alertness, reaction time, and concentration. Sleep disorders, such as insomnia and sleep apnea. Difficulty with: Solving problems. Coping with stress. Paying attention. These issues may affect your performance and productivity at work, school, and home. Lack of sleep may also put you at higher risk for accidents, suicide, and risky behaviors. If you do not get quality sleep, you may also be at higher risk for several health problems, including: Infections. Type 2 diabetes. Heart disease. High blood pressure. Obesity. Worsening of long-term conditions, like arthritis, kidney disease, depression, Parkinson's disease, and epilepsy. What actions can I take to get more quality sleep? Sleep schedule and routine Stick to a sleep schedule. Go to sleep and wake up at about the same time each day. Do not try to sleep less on weekdays and make up for lost sleep on weekends. This does not work. Limit naps during the day to 30 minutes or less. Do not take naps in the late afternoon. Make time to relax before bed. Reading, listening to music, or taking a hot bath promotes quality sleep. Make your bedroom a place that promotes quality sleep. Keep your bedroom dark, quiet, and at a comfortable room temperature. Make sure your bed is  comfortable. Avoid using electronic devices that give off bright blue light for 30 minutes before bedtime. Your brain perceives bright blue light as sunlight. This includes television, phones, and computers. If you are lying awake in bed for longer than 20 minutes, get up and do a relaxing activity until you feel sleepy. Lifestyle     Try to get at least  30 minutes of exercise on most days. Do not exercise 2-3 hours before going to bed. Do not use any products that contain nicotine or tobacco. These products include cigarettes, chewing tobacco, and vaping devices, such as e-cigarettes. If you need help quitting, ask your health care provider. Do not drink caffeinated beverages for at least 8 hours before going to bed. Coffee, tea, and some sodas contain caffeine. Do not drink alcohol or eat large meals close to bedtime. Try to get at least 30 minutes of sunlight every day. Morning sunlight is best. Medical concerns Work with your health care provider to treat medical conditions that may affect sleeping, such as: Nasal obstruction. Snoring. Sleep apnea and other sleep disorders. Talk to your health care provider if you think any of your prescription medicines may cause you to have difficulty falling or staying asleep. If you have sleep problems, talk with a sleep consultant. If you think you have a sleep disorder, talk with your health care provider about getting evaluated by a specialist. Where to find more information Sleep Foundation: sleepfoundation.org American Academy of Sleep Medicine: aasm.org Centers for Disease Control and Prevention (CDC): StoreMirror.com.cy Contact a health care provider if: You have trouble getting to sleep or staying asleep. You often wake up very early in the morning and cannot get back to sleep. You have daytime sleepiness. You have daytime sleep attacks of suddenly falling asleep and sudden muscle weakness (narcolepsy). You have a tingling sensation in your legs with a strong urge to move your legs (restless legs syndrome). You stop breathing briefly during sleep (sleep apnea). You think you have a sleep disorder or are taking a medicine that is affecting your quality of sleep. Summary Most adults need 7-8 hours of quality sleep each night. Getting enough quality sleep is important for your mental and physical  health. Make your bedroom a place that promotes quality sleep, and avoid things that may cause you to have poor sleep, such as alcohol, caffeine, smoking, or large meals. Talk to your health care provider if you have trouble falling asleep or staying asleep. This information is not intended to replace advice given to you by your health care provider. Make sure you discuss any questions you have with your health care provider. Document Revised: 07/11/2021 Document Reviewed: 07/11/2021 Elsevier Patient Education  [place].

## 2022-05-28 ENCOUNTER — Other Ambulatory Visit: Payer: Self-pay | Admitting: Obstetrics and Gynecology

## 2022-05-28 ENCOUNTER — Other Ambulatory Visit (HOSPITAL_COMMUNITY)
Admission: RE | Admit: 2022-05-28 | Discharge: 2022-05-28 | Disposition: A | Discharge status: Home / Self Care | Payer: BC Managed Care – PPO | Source: Ambulatory Visit | Attending: Obstetrics and Gynecology | Admitting: Obstetrics and Gynecology

## 2022-05-28 DIAGNOSIS — Z01419 Encounter for gynecological examination (general) (routine) without abnormal findings: Secondary | ICD-10-CM | POA: Diagnosis not present

## 2022-05-31 LAB — CYTOLOGY - PAP: Diagnosis: NEGATIVE

## 2022-06-16 ENCOUNTER — Ambulatory Visit
Admission: EM | Admit: 2022-06-16 | Discharge: 2022-06-16 | Disposition: A | Discharge status: Home / Self Care | Payer: BC Managed Care – PPO | Attending: Urgent Care | Admitting: Urgent Care

## 2022-06-16 ENCOUNTER — Ambulatory Visit (INDEPENDENT_AMBULATORY_CARE_PROVIDER_SITE_OTHER): Payer: BC Managed Care – PPO

## 2022-06-16 DIAGNOSIS — S46911A Strain of unspecified muscle, fascia and tendon at shoulder and upper arm level, right arm, initial encounter: Secondary | ICD-10-CM

## 2022-06-16 DIAGNOSIS — M25511 Pain in right shoulder: Secondary | ICD-10-CM | POA: Diagnosis not present

## 2022-06-16 DIAGNOSIS — W19XXXA Unspecified fall, initial encounter: Secondary | ICD-10-CM

## 2022-06-16 MED ORDER — IBUPROFEN 600 MG PO TABS: 600.0000 mg | ORAL_TABLET | Freq: Three times a day (TID) | ORAL | 0 refills | Status: DC

## 2022-06-16 NOTE — ED Triage Notes (Signed)
Pt states that she fell and has some right shoulder pain. X3 days

## 2022-06-16 NOTE — ED Provider Notes (Signed)
[facility]  Note:  This document was prepared using Systems analyst and may include unintentional dictation errors.  MRN: [id number] DOB: 1995/04/29  Subjective:   Lindsey Stevenson is a 27 y.o. female presenting for 3-day history of acute onset persistent moderate to severe right shoulder pain, decreased range of motion.  Symptoms started after she suffered an accidental fall.  She fell backwards and ended up bracing her fall with her arms behind her absorbing the impact on the buttock and hands.  Has not used any kind of pain medicine.  She is breast-feeding.  Has hurt her shoulder before but has never had any particular surgeries or procedures for the shoulder.  No current facility-administered medications for this encounter.  Current Outpatient Medications:    acetaminophen (TYLENOL) 500 MG tablet, Take 500 mg by mouth every 6 (six) hours as needed for moderate pain., Disp: , Rfl:    cholecalciferol (VITAMIN D3) 25 MCG (1000 UNIT) tablet, Take 2,000 Units by mouth daily., Disp: , Rfl:    ibuprofen (ADVIL) 600 MG tablet, Take 1 tablet (600 mg total) by mouth every 6 (six) hours as needed for mild pain, moderate pain or cramping., Disp: 30 tablet, Rfl: 1   Prenatal Vit-Fe Fumarate-FA (PRENATAL MULTIVITAMIN) TABS tablet, Take 1 tablet by mouth daily at 12 noon., Disp: , Rfl:    No Known Allergies  Past Medical History:  Diagnosis Date   Anxiety    Depression    Migraine    SVT (supraventricular tachycardia)    Vaginal Pap smear, abnormal 2019   HPV     Past Surgical History:  Procedure Laterality Date   NO PAST SURGERIES      Family History  Problem Relation Age of Onset   Cancer Maternal Grandmother    Arthritis Maternal Grandmother    Cancer Maternal Grandfather    Hypertension Paternal Grandmother    Anxiety disorder Mother    Hypertension Mother    Hypertension Father    Asthma Brother    Diabetes Brother    Early death  Sister     Social History   Tobacco Use   Smoking status: Never   Smokeless tobacco: Never  Vaping Use   Vaping Use: Never used  Substance Use Topics   Alcohol use: Not Currently   Drug use: Never    ROS   Objective:   Vitals: BP 117/77 (BP Location: Left Arm)   Pulse 85   Temp 97.9 F (36.6 C) (Oral)   Resp 19   Ht 5\' 7"  (1.702 m)   Wt 170 lb (77.1 kg)   LMP 06/15/2022   SpO2 98%   Breastfeeding Yes   BMI 26.63 kg/m   Physical Exam Constitutional:      General: She is not in acute distress.    Appearance: Normal appearance. She is well-developed. She is not ill-appearing, toxic-appearing or diaphoretic.  HENT:     Head: Normocephalic and atraumatic.     Right Ear: External ear normal.     Left Ear: External ear normal.     Nose: Nose normal.     Mouth/Throat:     Mouth: Mucous membranes are moist.  Eyes:     General: No scleral icterus.       Right eye: No discharge.        Left eye: No discharge.     Extraocular Movements: Extraocular movements intact.  Cardiovascular:     Rate and Rhythm: Normal rate.  Pulmonary:     Effort: Pulmonary effort is normal.  Musculoskeletal:     Right shoulder: Tenderness (anterior-laterally) present. No swelling, deformity, effusion, laceration, bony tenderness or crepitus. Decreased range of motion (in all directions, near full passive range of motion). Normal strength.     Right upper arm: No swelling, edema, deformity, lacerations, tenderness or bony tenderness.     Cervical back: Normal range of motion and neck supple. No swelling, edema, deformity, erythema, signs of trauma, lacerations, rigidity, spasms, torticollis, tenderness, bony tenderness or crepitus. No pain with movement, spinous process tenderness or muscular tenderness. Normal range of motion.  Skin:    General: Skin is warm and dry.  Neurological:     General: No focal deficit present.     Mental Status: She is alert and oriented to person, place, and  time.     Cranial Nerves: No cranial nerve deficit.     Motor: No weakness.     Coordination: Coordination normal.     Gait: Gait normal.     Deep Tendon Reflexes: Reflexes normal.  Psychiatric:        Mood and Affect: Mood normal.        Behavior: Behavior normal.     Assessment and Plan :   PDMP not reviewed this encounter.  1. Acute pain of right shoulder   2. Right shoulder strain, initial encounter     X-ray over-read was pending at time of discharge, recommended follow up with only abnormal results. Otherwise will not call for negative over-read. Patient was in agreement. Will manage conservatively for back strain with ibuprofen, rest and modification of physical activity.  Discussed shoulder rehabilitation and physical therapy.  Follow-up with an orthopedist for further testing and management as needed.  Counseled patient on potential for adverse effects with medications prescribed/recommended today, ER and return-to-clinic precautions discussed, patient verbalized understanding.    Jaynee Eagles, [place] 06/16/22 [phone]

## 2022-06-16 NOTE — Discharge Instructions (Addendum)
You can safely use ibuprofen even though you are breast-feeding and it is recommended for the shoulder pain and inflammation that comes with a shoulder strain.  You can do the shoulder physical therapy daily using 15 repetitions in each direction.  Follow-up with an orthopedic practice if your symptoms persist as they can do more testing like an ultrasound and/or an MRI as necessary.  I will call you today with your shoulder x-ray results.

## 2022-09-01 ENCOUNTER — Other Ambulatory Visit: Payer: Self-pay

## 2022-09-01 ENCOUNTER — Emergency Department (HOSPITAL_BASED_OUTPATIENT_CLINIC_OR_DEPARTMENT_OTHER): Payer: BC Managed Care – PPO

## 2022-09-01 ENCOUNTER — Encounter (HOSPITAL_BASED_OUTPATIENT_CLINIC_OR_DEPARTMENT_OTHER): Payer: Self-pay

## 2022-09-01 ENCOUNTER — Emergency Department (HOSPITAL_BASED_OUTPATIENT_CLINIC_OR_DEPARTMENT_OTHER)
Admission: EM | Admit: 2022-09-01 | Discharge: 2022-09-01 | Disposition: A | Discharge status: Home / Self Care | Payer: BC Managed Care – PPO | Attending: Emergency Medicine | Admitting: Emergency Medicine

## 2022-09-01 DIAGNOSIS — Z3A01 Less than 8 weeks gestation of pregnancy: Secondary | ICD-10-CM | POA: Insufficient documentation

## 2022-09-01 DIAGNOSIS — R102 Pelvic and perineal pain: Secondary | ICD-10-CM | POA: Insufficient documentation

## 2022-09-01 DIAGNOSIS — O26891 Other specified pregnancy related conditions, first trimester: Secondary | ICD-10-CM | POA: Diagnosis not present

## 2022-09-01 LAB — URINALYSIS, ROUTINE W REFLEX MICROSCOPIC
Bacteria, UA: NONE SEEN
Bilirubin Urine: NEGATIVE
Glucose, UA: NEGATIVE mg/dL
Hgb urine dipstick: NEGATIVE
Ketones, ur: NEGATIVE mg/dL
Leukocytes,Ua: NEGATIVE
Nitrite: NEGATIVE
Protein, ur: NEGATIVE mg/dL
Specific Gravity, Urine: 1.016 (ref 1.005–1.030)
pH: 7 (ref 5.0–8.0)

## 2022-09-01 LAB — WET PREP, GENITAL
Clue Cells Wet Prep HPF POC: NONE SEEN
Sperm: NONE SEEN
Trich, Wet Prep: NONE SEEN
WBC, Wet Prep HPF POC: <10 (ref <10)
Yeast Wet Prep HPF POC: NONE SEEN

## 2022-09-01 LAB — COMPREHENSIVE METABOLIC PANEL
ALT: 15 U/L (ref 0–44)
AST: 14 U/L — ABNORMAL LOW (ref 15–41)
Albumin: 4.5 g/dL (ref 3.5–5.0)
Alkaline Phosphatase: 50 U/L (ref 38–126)
Anion gap: 9 (ref 5–15)
BUN: 10 mg/dL (ref 6–20)
CO2: 23 mmol/L (ref 22–32)
Calcium: 10.1 mg/dL (ref 8.9–10.3)
Chloride: 105 mmol/L (ref 98–111)
Creatinine, Ser: 0.78 mg/dL (ref 0.44–1.00)
GFR, Estimated: >60 mL/min (ref >60)
Glucose, Bld: 105 mg/dL — ABNORMAL HIGH (ref 70–99)
Potassium: 4 mmol/L (ref 3.5–5.1)
Sodium: 137 mmol/L (ref 135–145)
Total Bilirubin: 0.5 mg/dL (ref 0.3–1.2)
Total Protein: 7.4 g/dL (ref 6.5–8.1)

## 2022-09-01 LAB — CBC
HCT: 39.6 % (ref 36.0–46.0)
Hemoglobin: 13.6 g/dL (ref 12.0–15.0)
MCH: 30.2 pg (ref 26.0–34.0)
MCHC: 34.3 g/dL (ref 30.0–36.0)
MCV: 87.8 fL (ref 80.0–100.0)
Platelets: 264 10*3/uL (ref 150–400)
RBC: 4.51 MIL/uL (ref 3.87–5.11)
RDW: 12.3 % (ref 11.5–15.5)
WBC: 9.3 10*3/uL (ref 4.0–10.5)
nRBC: 0 % (ref 0.0–0.2)

## 2022-09-01 LAB — LIPASE, BLOOD: Lipase: 39 U/L (ref 11–51)

## 2022-09-01 LAB — HCG, QUANTITATIVE, PREGNANCY: hCG, Beta Chain, Quant, S: 120 m[IU]/mL — ABNORMAL HIGH (ref <5)

## 2022-09-01 MED ORDER — ACETAMINOPHEN 500 MG PO TABS
1000.0000 mg | ORAL_TABLET | Freq: Once | ORAL | Status: AC
  Administered 2022-09-01: 1000 mg via ORAL
  Filled 2022-09-01: qty 2

## 2022-09-01 NOTE — ED Notes (Signed)
Pt discharged to home using teachback Method. Discharge instructions have been discussed with patient and/or family members. Pt verbally acknowledges understanding d/c instructions, has been given opportunity for questions to be answered, and endorses comprehension to checkout at registration before leaving.  

## 2022-09-01 NOTE — Discharge Instructions (Signed)
You have developed abdominal pain during pregnancy.  Ultrasound today did not show any specific finding however it is important for you to follow-up at your OB/GYN, or at referral below in 2 to 4 days for repeat testing.

## 2022-09-01 NOTE — ED Triage Notes (Signed)
Patient here POV from Home.  Endorses RLQ ABD Pain for a few Months. Currently [redacted] Weeks Pregnant. LMP: 08/06/22. Some nausea. No emesis. No Diarrhea. No Fevers.   NAD Noted during Triage. A&Ox4. GCS 15. Ambulatory.

## 2022-09-01 NOTE — ED Provider Notes (Signed)
Lindsey Stevenson is a 27 y.o. female.  The history is provided by the patient and medical records. No language interpreter was used.  Abdominal Pain    27 year old female who is approximately [redacted] weeks pregnant with her last menstrual period on 5/7 presenting today with complaint of abdominal pain.  She normally have intermittent lower pelvic pain on the right side usually happen around her menstruation.  However today she noticed the pain is a bit more intense than usual as well as having some right shoulder discomfort.  She also noticed that she missed her period and had an at home pregnancy test done today which came back positive.  She is here requesting to be evaluated for potential ectopic pregnancy.  Patient has never had ectopic pregnancy in the past.  She is a G3 P2 with 2 prior normal vaginal delivery without any complication.  She did endorse some nausea earlier in the week.  She denies any fever or chills vaginal bleeding or vaginal discharge or dysuria.  Home Medications Prior to Admission medications   Medication Sig Start Date End Date Taking? Authorizing Provider  acetaminophen (TYLENOL) 500 MG tablet Take 500 mg by mouth every 6 (six) hours as needed for moderate pain.    [provider]  cholecalciferol (VITAMIN D3) 25 MCG (1000 UNIT) tablet Take 2,000 Units by mouth daily.    [provider]  ibuprofen (ADVIL) 600 MG tablet Take 1 tablet (600 mg total) by mouth 3 (three) times daily with meals. 06/16/22   Wallis Bamberg, PA-C  Prenatal Vit-Fe Fumarate-FA (PRENATAL MULTIVITAMIN) TABS tablet Take 1 tablet by mouth daily at 12 noon.    [provider]      Allergies    Patient has no known allergies.    Review of Systems   Review of Systems   Gastrointestinal:  Positive for abdominal pain.  All other systems reviewed and are negative.   Physical Exam Updated Vital Signs BP (!) 136/91 (BP Location: Right Arm)   Pulse 98   Temp 98.3 F (36.8 C) (Oral)   Resp 18   Ht 5\' 7"  (1.702 m)   Wt 72.6 kg   LMP 08/06/2022   SpO2 100%   BMI 25.06 kg/m  Physical Exam Vitals and nursing note reviewed.  Constitutional:      General: She is not in acute distress.    Appearance: She is well-developed.  HENT:     Head: Atraumatic.  Eyes:     Conjunctiva/sclera: Conjunctivae normal.  Cardiovascular:     Rate and Rhythm: Normal rate and regular rhythm.  Pulmonary:     Effort: Pulmonary effort is normal.  Abdominal:     General: Abdomen is flat. Bowel sounds are normal.     Palpations: Abdomen is soft.     Tenderness: There is no abdominal tenderness.  Musculoskeletal:     Cervical back: Neck supple.  Skin:    Findings: No rash.  Neurological:     Mental Status: She is alert.  Psychiatric:        Mood and Affect: Mood normal.     ED Results / Procedures / Treatments   Labs (all labs ordered are listed, but only abnormal results are displayed) Labs Reviewed  COMPREHENSIVE METABOLIC PANEL - Abnormal;  Notable for the following components:      Result Value   Glucose, Bld 105 (*)    AST 14 (*)    All other components within normal limits  HCG, QUANTITATIVE, PREGNANCY - Abnormal; Notable for the following components:   hCG, Beta Chain, Quant, S 120 (*)    All other components within normal limits  WET PREP, GENITAL  LIPASE, BLOOD  CBC  URINALYSIS, ROUTINE W REFLEX MICROSCOPIC  RPR  HIV ANTIBODY (ROUTINE TESTING W REFLEX)  GC/CHLAMYDIA PROBE AMP ([place]) NOT AT [facility]    EKG None  Radiology No results found.  Procedures Pelvic exam  Date/Time: 09/01/2022 5:50 PM  Performed by: Fayrene Helper, PA-C Authorized by: Fayrene Helper, PA-C  Comments: Chaperone present during exam.  No inguinal lymphadenopathy or  inguinal hernia noted.  Normal external exam.  Mild discomfort with speculum insertion.  Normal vaginal vault free of lesion or rash.  Close cervical os no vaginal bleeding noted.  On bimanual exam no adnexal tenderness or cervical motion tenderness.       Medications Ordered in ED Medications  acetaminophen (TYLENOL) tablet 1,000 mg (has no administration in time range)    ED Course/ Medical Decision Making/ A&P Clinical Course as of 09/01/22 1853  Sun Sep 01, 2022  1851 Lower abdominal pain, new pregnancy today, pelvic exam unremarkable. Getting ultrasound rn. LMP 5/21. Follow up with OBGYN. [CG]    Clinical Course User Index [CG] Al Decant, PA-C                             Medical Decision Making Amount and/or Complexity of Data Reviewed Labs: ordered. Radiology: ordered.  Risk OTC drugs.   BP (!) 136/91 (BP Location: Right Arm)   Pulse 98   Temp 98.3 F (36.8 C) (Oral)   Resp 18   Ht 5\' 7"  (1.702 m)   Wt 72.6 kg   LMP 08/06/2022   SpO2 100%   BMI 25.06 kg/m   2:62 PM  27 year old female who is approximately [redacted] weeks pregnant with her last menstrual period on 5/7 presenting today with complaint of abdominal pain.  She normally have intermittent lower pelvic pain on the right side usually happen around her menstruation.  However today she noticed the pain is a bit more intense than usual as well as having some right shoulder discomfort.  She also noticed that she missed her period and had an at home pregnancy test done today which came back positive.  She is here requesting to be evaluated for potential ectopic pregnancy.  Patient has never had ectopic pregnancy in the past.  She is a G3 P2 with 2 prior normal vaginal delivery without any complication.  She did endorse some nausea earlier in the week.  She denies any fever or chills vaginal bleeding or vaginal discharge or dysuria.  On exam patient is overall well-appearing resting comfortably in bed appears  to be in no acute discomfort.  Heart with normal rate and rhythm, lungs are clear to auscultation bilaterally abdomen is soft nontender no tenderness to palpation of the shoulders or scapula.    5:51 PM Pelvic exam performed by me and overall reassuring.  Will obtain transvaginal ultrasound to rule out ectopic pregnancy.  -Labs ordered, independently viewed and interpreted by me.  Labs remarkable for beta quant hCG 120.  UA negative for infection.  Labs are reassuring.  -The patient was maintained on a cardiac  monitor.  I personally viewed and interpreted the cardiac monitored which showed an underlying rhythm of: NSR -Imaging including transvaginal US ordered but not resulted yet.  Oncoming provider will follow up -This patient presents to the ED for concern of abd pain, this involves an extensive number of treatment options, and is a complaint that carries with it a high risk of complications and morbidity.  The differential diagnosis includes ectopic pregnancy, round ligament syndrome, ovarian torsion, TOA, appendicitis, UTI -Co morbidities that complicate the patient evaluation includes pregnancy -Treatment includes tylenol -Reevaluation of the patient after these medicines showed that the patient improved -PCP office notes or outside notes reviewed -Discussion with oncoming provider who will f/u on [place] result and determine disposition -Escalation to admission/observation considered: dispo pending          Final Clinical Impression(s) / ED Diagnoses Final diagnoses:  None    Rx / DC Orders ED Discharge Orders     None         Fayrene Helper, PA-C 09/01/22 1853    Melene Plan, DO 09/01/22 Herbie [place]

## 2022-09-01 NOTE — ED Provider Notes (Signed)
  Physical Exam  BP 124/79   Pulse 94   Temp 98.3 F (36.8 C) (Oral)   Resp 20   Ht 5\' 7"  (1.702 m)   Wt 72.6 kg   LMP 08/06/2022   SpO2 100%   BMI 25.06 kg/m   Physical Exam Vitals and nursing note reviewed.  Constitutional:      General: She is not in acute distress.    Appearance: She is well-developed.  HENT:     Head: Normocephalic and atraumatic.  Eyes:     Conjunctiva/sclera: Conjunctivae normal.  Cardiovascular:     Rate and Rhythm: Normal rate and regular rhythm.     Heart sounds: No murmur heard. Pulmonary:     Effort: Pulmonary effort is normal. No respiratory distress.     Breath sounds: Normal breath sounds.  Abdominal:     Palpations: Abdomen is soft.     Tenderness: There is no abdominal tenderness.  Musculoskeletal:        General: No swelling.     Cervical back: Neck supple.  Skin:    General: Skin is warm and dry.     Capillary Refill: Capillary refill takes less than 2 seconds.  Neurological:     Mental Status: She is alert.  Psychiatric:        Mood and Affect: Mood normal.     Procedures  Procedures  ED Course / MDM   Clinical Course as of 09/01/22 1937  Wynelle Link Sep 01, 2022  1851 Lower abdominal pain, new pregnancy today, pelvic exam unremarkable. Getting ultrasound rn. LMP 5/21. Follow up with OBGYN. [CG]    Clinical Course User Index [CG] Al Decant, PA-C   Medical Decision Making Amount and/or Complexity of Data Reviewed Labs: ordered. Radiology: ordered.  Risk OTC drugs.   27 year old female signed out to me at shift change pending ultrasound imaging.  Please see previous provider note for further details of the event.  In short, this is a 27 year old female who presents to the ED for abdominal pain.  The patient is found to have an elevated beta hCG quantitative test here today concerning for new pregnancy.  Patient signed out to me pending ultrasound imaging. Reported LMP 5/21.   Ultrasound imaging shows no  intrauterine gestational sac.  Patient pregnancy very new so possibly enough time could not have passed for this to be seen yet.  [facility] center, for reevaluation and recheck.  I explained to the patient the importance of having this done and she has voiced understanding.  She is stable to discharge at this time.  Return precautions were provided and she voiced understanding.  All questions answered to her satisfaction.         Al Decant, PA-C 09/01/22 1937    Melene Plan, DO 09/01/22 1940

## 2022-09-02 LAB — GC/CHLAMYDIA PROBE AMP (~~LOC~~) NOT AT ARMC
Chlamydia: NEGATIVE
Comment: NEGATIVE
Comment: NORMAL
Neisseria Gonorrhea: NEGATIVE

## 2022-09-02 LAB — RPR: RPR Ser Ql: NONREACTIVE

## 2022-09-04 ENCOUNTER — Inpatient Hospital Stay (HOSPITAL_COMMUNITY): Payer: BC Managed Care – PPO

## 2022-09-04 ENCOUNTER — Inpatient Hospital Stay (HOSPITAL_COMMUNITY)
Admission: AD | Admit: 2022-09-04 | Discharge: 2022-09-05 | Disposition: A | Discharge status: Home / Self Care | Payer: BC Managed Care – PPO | Attending: Obstetrics and Gynecology | Admitting: Obstetrics and Gynecology

## 2022-09-04 ENCOUNTER — Encounter (HOSPITAL_COMMUNITY): Payer: Self-pay

## 2022-09-04 DIAGNOSIS — O26891 Other specified pregnancy related conditions, first trimester: Secondary | ICD-10-CM | POA: Diagnosis not present

## 2022-09-04 DIAGNOSIS — Z3A01 Less than 8 weeks gestation of pregnancy: Secondary | ICD-10-CM | POA: Insufficient documentation

## 2022-09-04 DIAGNOSIS — O3680X0 Pregnancy with inconclusive fetal viability, not applicable or unspecified: Secondary | ICD-10-CM | POA: Insufficient documentation

## 2022-09-04 DIAGNOSIS — R1031 Right lower quadrant pain: Secondary | ICD-10-CM | POA: Diagnosis not present

## 2022-09-04 DIAGNOSIS — N83201 Unspecified ovarian cyst, right side: Secondary | ICD-10-CM

## 2022-09-04 NOTE — MAU Provider Note (Signed)
Chief Complaint: Labs Only   Event Date/Time   First Provider Initiated Contact with Patient 09/04/22 2318        SUBJECTIVE HPI: Lindsey Stevenson is a 27 y.o. G3P2002 at [redacted]w[redacted]d by LMP who presents to maternity admissions reporting needing followup HCG today.  She was seen two days ago for abdominal cramping with radiation to shoulder.  Did not have bleeding.  Quant HCG was 120.Marland KitchenPain today is about the same  Does report that she has similar pain when she ovulates when not pregnant also.   She denies vaginal bleeding, vaginal itching/burning, urinary symptoms, h/a, dizziness, n/v, or fever/chills.    Abdominal Pain This is a recurrent problem. The current episode started in the past 7 days. The onset quality is gradual. The pain is located in the suprapubic region. The pain is at a severity of 7/10. The quality of the pain is cramping and sharp. Pain radiation: Right shoulder. Pertinent negatives include no constipation, diarrhea, dysuria, fever or frequency. Nothing aggravates the pain. The pain is relieved by Nothing. She has tried nothing for the symptoms.   RN note: Lindsey Stevenson is a 27 y.o. at Unknown here in MAU reporting: was in the ED 2 days ago and was told ot come back for blood work.  Reports that the pain is the same as her ED visit but it gets worse when she walks and feels like "nerve" pain and it radiates to her right shoulder. Denies vaginal bleeding.  LMP: 08/06/2022  Past Medical History:  Diagnosis Date   Anxiety    Depression    Migraine    SVT (supraventricular tachycardia)    Vaginal Pap smear, abnormal 2019   HPV   Past Surgical History:  Procedure Laterality Date   NO PAST SURGERIES     Social History   Socioeconomic History   Marital status: Married    Spouse name: Jared   Number of children: Not on file   Years of education: Not on file   Highest education level: Not on file  Occupational History   Not on file  Tobacco Use   Smoking status: Never    Smokeless tobacco: Never  Vaping Use   Vaping Use: Never used  Substance and Sexual Activity   Alcohol use: Not Currently   Drug use: Never   Sexual activity: Yes    Birth control/protection: None  Other Topics Concern   Not on file  Social History Narrative   Not on file   Social Determinants of Health   Financial Resource Strain: Not on file  Food Insecurity: Not on file  Transportation Needs: Not on file  Physical Activity: Not on file  Stress: Not on file  Social Connections: Not on file  Intimate Partner Violence: Not on file   No current facility-administered medications on file prior to encounter.   Current Outpatient Medications on File Prior to Encounter  Medication Sig Dispense Refill   acetaminophen (TYLENOL) 500 MG tablet Take 500 mg by mouth every 6 (six) hours as needed for moderate pain.     cholecalciferol (VITAMIN D3) 25 MCG (1000 UNIT) tablet Take 2,000 Units by mouth daily.     ibuprofen (ADVIL) 600 MG tablet Take 1 tablet (600 mg total) by mouth 3 (three) times daily with meals. 30 tablet 0   Prenatal Vit-Fe Fumarate-FA (PRENATAL MULTIVITAMIN) TABS tablet Take 1 tablet by mouth daily at 12 noon.     No Known Allergies  I have reviewed patient's Past Medical Hx,  Surgical Hx, Family Hx, Social Hx, medications and allergies.   ROS:  Review of Systems  Constitutional:  Negative for fever.  Gastrointestinal:  Positive for abdominal pain. Negative for constipation and diarrhea.  Genitourinary:  Negative for dysuria and frequency.   Review of Systems  Other systems negative   Physical Exam  Physical Exam Patient Vitals for the past 24 hrs:  BP Temp Temp src Pulse Resp SpO2 Height Weight  09/04/22 2211 123/72 98 F (36.7 C) Oral 86 20 100 % 5\' 7"  (1.702 m) 74.4 kg   Constitutional: Well-developed, well-nourished female in no acute distress.  Cardiovascular: normal rate Respiratory: normal effort GI: Abd soft, non-tender.  MS: Extremities nontender,  no edema, normal ROM Neurologic: Alert and oriented x 4.  GU: Neg CVAT.  PELVIC EXAM: Deferred in lieu of transvaginal ultrasound  LAB RESULTS Results for orders placed or performed during the hospital encounter of 09/04/22 (from the past 24 hour(s))  hCG, quantitative, pregnancy     Status: Abnormal   Collection Time: 09/04/22 11:05 PM  Result Value Ref Range   hCG, Beta Chain, Quant, S 614 (H) <5 mIU/mL      Component Ref Range & Units 4 d ago  hCG, Beta Chain, Quant, S <5 mIU/mL 120 High       --/--/O POS (06/26 2137)  IMAGING US OB Transvaginal  Result Date: 09/05/2022 CLINICAL DATA:  Right lower quadrant pain. EXAM: TRANSVAGINAL OB ULTRASOUND TECHNIQUE: Transvaginal ultrasound was performed for complete evaluation of the gestation as well as the maternal uterus, adnexal regions, and pelvic cul-de-sac. COMPARISON:  09/01/2022. FINDINGS: Intrauterine gestational sac: None Yolk sac:  No Embryo:  No Cardiac Activity: No Heart Rate: None Subchorionic hemorrhage: The ovaries are within normal limits. A thick walled lesion with central hyperechoic focus is present in the right ovary. Maternal uterus/adnexae: Small to moderate amount of simple free fluid in the pelvis. IMPRESSION: 1. No evidence of intrauterine pregnancy. The possibility of ectopic pregnancy can not be excluded. Correlation with beta HCG is recommended. 2. Thick walled lesion with hyperechoic central region in the right ovary, possible hemorrhagic or corpus luteal cyst. 3. Small to moderate amount of free fluid in the pelvis. Electronically Signed   By: Thornell Sartorius M.D.   On: 09/05/2022 00:19      MAU Management/MDM: I have reviewed the triage vital signs and the nursing notes.   Pertinent labs & imaging results that were available during my care of the patient were reviewed by me and considered in my medical decision making (see chart for details).      I have reviewed her medical records including past results, notes  and treatments. Medical, Surgical, and family history were reviewed.  Medications and recent lab tests were reviewed  Ordered followup HCG level   Will check followup Ultrasound to rule out ectopic, since she has persistent radiation fo pain to her shoulder.  Consult Dr Vergie Living with presentation, exam findings, and results. He recommends repeating HCG Saturday  Appt made for 0800    This bleeding/pain can represent a normal pregnancy with bleeding, spontaneous abortion or even an ectopic which can be life-threatening.  The process as listed above helps to determine which of these is present.    ASSESSMENT Pregnancy at [redacted]w[redacted]d Right lower quadrant pain with radiation to shoulder Pregnancy of unknown location  PLAN Discharge home Plan to repeat HCG level in 48 hours  Will repeat  Ultrasound in about 7-10 days if HCG levels double appropriately  Ectopic precautions   Pt stable at time of discharge. Encouraged to return here if she develops worsening of symptoms, increase in pain, fever, or other concerning symptoms.    Wynelle Bourgeois CNM, MSN Certified Nurse-Midwife 09/04/2022  11:18 PM

## 2022-09-04 NOTE — MAU Note (Signed)
.  Lindsey Stevenson is a 27 y.o. at Unknown here in MAU reporting: was in the ED 2 days ago and was told ot come back for blood work.  Reports that the pain is the same as her ED visit but it gets worse when she walks and feels like "nerve" pain and it radiates to her right shoulder. Denies vaginal bleeding.  LMP: 08/06/2022  Pain score: 7/10 Vitals:   09/04/22 2211  BP: 123/72  Pulse: 86  Resp: 20  Temp: 98 F (36.7 C)  SpO2: 100%

## 2022-09-05 DIAGNOSIS — R1031 Right lower quadrant pain: Secondary | ICD-10-CM

## 2022-09-05 DIAGNOSIS — O3680X0 Pregnancy with inconclusive fetal viability, not applicable or unspecified: Secondary | ICD-10-CM

## 2022-09-05 DIAGNOSIS — N83201 Unspecified ovarian cyst, right side: Secondary | ICD-10-CM | POA: Diagnosis not present

## 2022-09-05 DIAGNOSIS — Z3A01 Less than 8 weeks gestation of pregnancy: Secondary | ICD-10-CM | POA: Diagnosis not present

## 2022-09-05 DIAGNOSIS — O26891 Other specified pregnancy related conditions, first trimester: Secondary | ICD-10-CM | POA: Diagnosis not present

## 2022-09-05 LAB — HCG, QUANTITATIVE, PREGNANCY: hCG, Beta Chain, Quant, S: 614 m[IU]/mL — ABNORMAL HIGH (ref <5)

## 2022-09-07 ENCOUNTER — Inpatient Hospital Stay (HOSPITAL_COMMUNITY)
Admission: AD | Admit: 2022-09-07 | Discharge: 2022-09-07 | Disposition: A | Discharge status: Home / Self Care | Payer: BC Managed Care – PPO | Attending: Obstetrics and Gynecology | Admitting: Obstetrics and Gynecology

## 2022-09-07 ENCOUNTER — Encounter (HOSPITAL_COMMUNITY): Payer: Self-pay | Admitting: Obstetrics and Gynecology

## 2022-09-07 ENCOUNTER — Inpatient Hospital Stay (HOSPITAL_COMMUNITY)
Admit: 2022-09-07 | Discharge: 2022-09-07 | Disposition: A | Discharge status: Home / Self Care | Payer: BC Managed Care – PPO | Attending: Obstetrics and Gynecology | Admitting: Obstetrics and Gynecology

## 2022-09-07 DIAGNOSIS — O3680X0 Pregnancy with inconclusive fetal viability, not applicable or unspecified: Secondary | ICD-10-CM | POA: Insufficient documentation

## 2022-09-07 DIAGNOSIS — Z3A01 Less than 8 weeks gestation of pregnancy: Secondary | ICD-10-CM | POA: Insufficient documentation

## 2022-09-07 LAB — HCG, QUANTITATIVE, PREGNANCY: hCG, Beta Chain, Quant, S: 2307 m[IU]/mL — ABNORMAL HIGH (ref <5)

## 2022-09-07 NOTE — MAU Provider Note (Signed)
History   Chief Complaint:  Pelvic Pain, Follow-up, Shoulder Pain, and Repeat hCG   Lindsey Stevenson is  27 y.o. [id number] Patient's last menstrual period was 08/06/2022.Marland Kitchen Patient is here for follow up of quantitative HCG and ongoing surveillance of pregnancy status. She is [redacted]w[redacted]d weeks gestation  by LMP.    Since her last visit, the patient is without new complaint. The patient reports bleeding as  none now.  She reports no change in the the pain she has been having since 09/01/2022 visit.  General ROS:  positive for lower abdominal cramping that radiates to shoulder.  Her previous Quantitative HCG values are:  09/01/2022 = 120 09/04/2022 = 614  Physical Exam   Blood pressure 112/73, pulse 78, temperature 98.3 F (36.8 C), temperature source Oral, resp. rate 18, height 5\' 7"  (1.702 m), weight 72.6 kg, last menstrual period 08/06/2022, SpO2 99 %, currently breastfeeding.  Focused Gynecological Exam: examination not indicated  Labs: Results for orders placed or performed during the hospital encounter of 09/07/22 (from the past 24 hour(s))  hCG, quantitative, pregnancy   Collection Time: 09/07/22  8:23 AM  Result Value Ref Range   hCG, Beta Chain, Quant, S 2,307 (H) <5 mIU/mL    Ultrasound Studies:   US OB Transvaginal  Result Date: 09/05/2022 CLINICAL DATA:  Right lower quadrant pain. EXAM: TRANSVAGINAL OB ULTRASOUND TECHNIQUE: Transvaginal ultrasound was performed for complete evaluation of the gestation as well as the maternal uterus, adnexal regions, and pelvic cul-de-sac. COMPARISON:  09/01/2022. FINDINGS: Intrauterine gestational sac: None Yolk sac:  No Embryo:  No Cardiac Activity: No Heart Rate: None Subchorionic hemorrhage: The ovaries are within normal limits. A thick walled lesion with central hyperechoic focus is present in the right ovary. Maternal uterus/adnexae: Small to moderate amount of simple free fluid in the pelvis. IMPRESSION: 1. No evidence of intrauterine pregnancy. The  possibility of ectopic pregnancy can not be excluded. Correlation with beta HCG is recommended. 2. Thick walled lesion with hyperechoic central region in the right ovary, possible hemorrhagic or corpus luteal cyst. 3. Small to moderate amount of free fluid in the pelvis. Electronically Signed   By: Thornell Sartorius M.D.   On: 09/05/2022 00:19   US OB LESS THAN 14 WEEKS WITH OB TRANSVAGINAL  Result Date: 09/01/2022 CLINICAL DATA:  Pelvic pain, pain right lower quadrant EXAM: OBSTETRIC <14 WK [place] AND TRANSVAGINAL OB US TECHNIQUE: Both transabdominal and transvaginal ultrasound examinations were performed for complete evaluation of the gestation as well as the maternal uterus, adnexal regions, and pelvic cul-de-sac. Transvaginal technique was performed to assess early pregnancy. COMPARISON:  None Available. FINDINGS: Intrauterine gestational sac: None Yolk sac:  Not seen Embryo:  Not seen Cardiac Activity: Not seen Subchorionic hemorrhage:  None visualized. Maternal uterus/adnexae: Small to moderate amount of free fluid is seen in pelvis. 2.1 x 1.8 cm hypoechoic structure in the right ovary may suggest hemorrhagic cyst/follicle. Left ovary is not sonographically visualized. IMPRESSION: There is no demonstrable intrauterine gestational sac. 2.1 cm hypoechoic structure in the right adnexa may suggest hemorrhagic cyst/follicle. Left ovary is not sonographically visualized. Small-to-moderate amount of free fluid in pelvis may be due to recent rupture of ovarian cyst or follicle. Electronically Signed   By: Ernie Avena M.D.   On: 09/01/2022 19:08    Assessment:   1. Pregnancy of unknown anatomic location   2. [redacted] weeks gestation of pregnancy       Plan: -Discharge home in stable condition -PUL precautions discussed -TC to patient @  0940 to discus HCG results from today's visit -Patient advised to follow-up with [facility] for viability U/S -Viability U/S scheduled for Tuesday 09/24/2022 at 0800. Patient advised  to arrive at [facility] for registration. Advised that a provider would be available after U/S to speak with her about the results either in person or over the phone -Patient may return to MAU as needed or if her condition were to change or worsen -Patient verbalized an understanding of the plan of care and agrees.    Raelyn Mora, CNM 09/07/2022, 8:34 AM

## 2022-09-07 NOTE — MAU Note (Signed)
...  Lindsey Stevenson is a 27 y.o. at [redacted]w[redacted]d here in MAU reporting: Here for repeat beta hCG. This will be her third hCG level drawn. She reports her pains have not worsened or eased up. She reports she is still experiencing right sided pelvic pain throughout the day that is worse with activity. She reports the pain seems to radiate to her right shoulder occasionally as well. Denies VB. Denies other complaints.  hCG levels: 6/2: 120 mIU/mL 6/5: 614 mIU/mL  U/S Impression from 6/5: 1. No evidence of intrauterine pregnancy. The possibility of ectopic pregnancy can not be excluded. Correlation with beta HCG is recommended. 2. Thick walled lesion with hyperechoic central region in the right ovary, possible hemorrhagic or corpus luteal cyst. 3. Small to moderate amount of free fluid in the pelvis.  LMP: 08/06/2022 - reports regular periods  Pain scores:  6/10 right pelvis 3/10 right shoulder  Lab orders placed from triage:  placed by CNM

## 2022-09-10 ENCOUNTER — Other Ambulatory Visit: Payer: Self-pay | Admitting: Family Medicine

## 2022-09-10 DIAGNOSIS — R109 Unspecified abdominal pain: Secondary | ICD-10-CM

## 2022-09-12 ENCOUNTER — Ambulatory Visit
Admission: RE | Admit: 2022-09-12 | Discharge: 2022-09-12 | Disposition: A | Discharge status: Home / Self Care | Payer: BC Managed Care – PPO | Source: Ambulatory Visit | Attending: Family Medicine | Admitting: Family Medicine

## 2022-09-12 DIAGNOSIS — R109 Unspecified abdominal pain: Secondary | ICD-10-CM

## 2022-09-17 ENCOUNTER — Telehealth: Payer: Self-pay

## 2022-09-17 DIAGNOSIS — O3680X0 Pregnancy with inconclusive fetal viability, not applicable or unspecified: Secondary | ICD-10-CM

## 2022-09-17 NOTE — Telephone Encounter (Addendum)
-----   Message from Lennart Pall, MD sent at 09/13/2022  3:55 PM EDT ----- Regarding: Follow up Hi all - Can we please coordinate a BhCG for this patient ideally for 6/17 or 6/18 as well as an ultrasound next week instead of 6/25? She is a PUL and needs closer interval follow up than originally planned. Please let me know if you need me to put in orders. Thanks! - KF   Called pt to review new recommendation. Ultrasound rescheduled to [facility] for 09/20/22 at 1115. Patient unable to come in for lab prior to Friday. Will come to office Friday at 9:30 for HCG level prior to [place].

## 2022-09-20 ENCOUNTER — Other Ambulatory Visit: Payer: BC Managed Care – PPO

## 2022-09-20 ENCOUNTER — Ambulatory Visit (HOSPITAL_COMMUNITY)
Admission: RE | Admit: 2022-09-20 | Discharge: 2022-09-20 | Disposition: A | Discharge status: Home / Self Care | Payer: BC Managed Care – PPO | Source: Ambulatory Visit | Attending: Obstetrics and Gynecology | Admitting: Obstetrics and Gynecology

## 2022-09-20 ENCOUNTER — Other Ambulatory Visit: Payer: Self-pay | Admitting: Obstetrics and Gynecology

## 2022-09-20 ENCOUNTER — Other Ambulatory Visit: Payer: Self-pay

## 2022-09-20 DIAGNOSIS — O3680X0 Pregnancy with inconclusive fetal viability, not applicable or unspecified: Secondary | ICD-10-CM

## 2022-09-21 LAB — BETA HCG QUANT (REF LAB): hCG Quant: 47602 m[IU]/mL

## 2022-09-23 ENCOUNTER — Ambulatory Visit: Payer: BC Managed Care – PPO | Admitting: Internal Medicine

## 2022-09-23 ENCOUNTER — Other Ambulatory Visit: Payer: Self-pay

## 2022-09-23 ENCOUNTER — Inpatient Hospital Stay (HOSPITAL_COMMUNITY)
Admission: AD | Admit: 2022-09-23 | Discharge: 2022-09-23 | Disposition: A | Discharge status: Home / Self Care | Payer: BC Managed Care – PPO | Attending: Family Medicine | Admitting: Family Medicine

## 2022-09-23 DIAGNOSIS — O26891 Other specified pregnancy related conditions, first trimester: Secondary | ICD-10-CM | POA: Insufficient documentation

## 2022-09-23 DIAGNOSIS — Z3A01 Less than 8 weeks gestation of pregnancy: Secondary | ICD-10-CM

## 2022-09-23 DIAGNOSIS — R1031 Right lower quadrant pain: Secondary | ICD-10-CM | POA: Diagnosis present

## 2022-09-23 LAB — MISC LABCORP TEST (SEND OUT): Labcorp test code: 83935

## 2022-09-23 LAB — URINALYSIS, ROUTINE W REFLEX MICROSCOPIC
Bilirubin Urine: NEGATIVE
Glucose, UA: NEGATIVE mg/dL
Hgb urine dipstick: NEGATIVE
Ketones, ur: 5 mg/dL — AB
Leukocytes,Ua: NEGATIVE
Nitrite: NEGATIVE
Protein, ur: NEGATIVE mg/dL
Specific Gravity, Urine: 1.027 (ref 1.005–1.030)
pH: 6 (ref 5.0–8.0)

## 2022-09-23 NOTE — MAU Provider Note (Signed)
None     S [name] is a 27 y.o. [id number] patient who presents to MAU today with complaint of right inguinal pain that has been intermittent for the past few years, but fairly consistent over the last couple of weeks.  The pain is in the right inguinal area and increases if she is active and moving around.  Sitting down and being inactive does not help.  She has to splint the area in order for the pain to improve.  In the past, she has been told it is due to a recurring ovarian cyst as this pain occurred with ovulation and menses.  Lately, this pain has been fairly continuous currently the pain is mild to moderate.  When it does occur, the pain is sharp and stabbing.  O BP (!) 111/57 (BP Location: Right Arm)   Pulse 77   Temp 98.1 F (36.7 C) (Oral)   Ht 5\' 7"  (1.702 m)   Wt 73.2 kg   LMP 08/06/2022   SpO2 99%   BMI 25.28 kg/m  Physical Exam Vitals reviewed.  Constitutional:      Appearance: Normal appearance.  Abdominal:     General: Abdomen is flat.     Palpations: Abdomen is soft.     Tenderness: There is no abdominal tenderness. There is no guarding or rebound.     Hernia: No hernia is present.     Comments: No right femoral or inguinal hernia palpated  Musculoskeletal:     Comments: Tenderness to palpation in the inguinal canal and upper part of the psoas  Neurological:     Mental Status: She is alert.  Psychiatric:        Mood and Affect: Mood normal.        Behavior: Behavior normal.        Thought Content: Thought content normal.        Judgment: Judgment normal.     A Medical screening exam complete 1. Right inguinal pain   2. [redacted] weeks gestation of pregnancy      P Discharge from MAU in stable condition Uncertain of the etiology of the pain.  Patient does have a small 2 cm cyst, but there is no right lower quadrant pain.  With this, it is unlikely that the ovarian cyst is causing her pain.  It does not appear to be obstetrical in nature.  No femoral  or inguinal hernia palpated.  This may be the result of a sports hernia.  Will refer to sports medicine.  I offered her pain medicine, but she declined Warning signs for worsening condition that would warrant emergency follow-up discussed Patient may return to MAU as needed   Levie Heritage, DO 09/23/2022 7:54 PM

## 2022-09-23 NOTE — MAU Note (Signed)
Lindsey Stevenson is a 27 y.o. at [redacted]w[redacted]d here in MAU reporting: right lower abdominal pain that worsens with activity.  States pain is intermittent and has occurred for several years, may have cyst.  Reports pain today is intermittent and stabbing.  Denies VB. LMP: 08/06/2022 Onset of complaint: ongoing Pain score: 7 Vitals:   09/23/22 1759  BP: (!) 111/57  Pulse: 77  Temp: 98.1 F (36.7 C)  SpO2: 99%     FHT:NA Lab orders placed from triage:   UA

## 2022-09-24 ENCOUNTER — Other Ambulatory Visit: Payer: BC Managed Care – PPO

## 2022-09-24 LAB — OB RESULTS CONSOLE RPR: RPR: NONREACTIVE

## 2022-09-24 LAB — OB RESULTS CONSOLE HEPATITIS B SURFACE ANTIGEN: Hepatitis B Surface Ag: NEGATIVE

## 2022-09-24 LAB — OB RESULTS CONSOLE ANTIBODY SCREEN: Antibody Screen: NEGATIVE

## 2022-09-24 LAB — HEPATITIS C ANTIBODY: HCV Ab: NEGATIVE

## 2022-09-24 LAB — OB RESULTS CONSOLE HIV ANTIBODY (ROUTINE TESTING): HIV: NONREACTIVE

## 2022-09-24 LAB — OB RESULTS CONSOLE RUBELLA ANTIBODY, IGM: Rubella: IMMUNE

## 2022-10-01 ENCOUNTER — Ambulatory Visit (INDEPENDENT_AMBULATORY_CARE_PROVIDER_SITE_OTHER): Payer: BC Managed Care – PPO | Admitting: Sports Medicine

## 2022-10-01 VITALS — BP 118/68 | HR 89 | Ht 67.0 in | Wt 164.0 lb

## 2022-10-01 DIAGNOSIS — M9905 Segmental and somatic dysfunction of pelvic region: Secondary | ICD-10-CM | POA: Diagnosis not present

## 2022-10-01 DIAGNOSIS — M9906 Segmental and somatic dysfunction of lower extremity: Secondary | ICD-10-CM

## 2022-10-01 DIAGNOSIS — S3981XA Other specified injuries of abdomen, initial encounter: Secondary | ICD-10-CM

## 2022-10-01 DIAGNOSIS — Z3A01 Less than 8 weeks gestation of pregnancy: Secondary | ICD-10-CM | POA: Diagnosis not present

## 2022-10-01 DIAGNOSIS — M9903 Segmental and somatic dysfunction of lumbar region: Secondary | ICD-10-CM

## 2022-10-01 NOTE — Progress Notes (Signed)
Lindsey Stevenson D.[facility] [address] [place] [address] Phone: [phone]   Assessment and Plan:    1. Sports hernia, initial encounter 2. Less than [redacted] weeks gestation of pregnancy 3. Somatic dysfunction of lumbar region 4. Somatic dysfunction of pelvic region 5. Somatic dysfunction of lower extremities  -Chronic with exacerbation, initial sports medicine visit - Most consistent with sports hernia at right superior pubic rami based on HPI, physical exam, unremarkable CT abdomen pelvis from 02/26/2022.  Suspect patient initially had injury from physical activity, running activities, that did not have an opportunity to fully heal - Discussed that we do not recommend prednisone or NSAID use during pregnancy with patient currently at [redacted] weeks gestation - Recommend Tylenol 500 mg 2-3 times a day as needed for pain relief - Start HEP for sports hernia - Patient elected for initial OMT today.  Tolerated well per note below. - Decision today to treat with OMT was based on Physical Exam  After verbal consent patient was treated with HVLA (high velocity low amplitude), ME (muscle energy), FPR (flex positional release), ST (soft tissue), PC/PD (Pelvic Compression/ Pelvic Decompression) techniques in lower extremity, lumbar, and pelvic areas. Patient tolerated the procedure well with improvement in symptoms.  Patient educated on potential side effects of soreness and recommended to rest, hydrate, and use Tylenol as needed for pain control.  Pertinent previous records reviewed include CT abdomen pelvis 02/26/2022, OB ultrasound 09/20/22, MAU provider note 09/23/2022   Follow Up: 4 to 5 weeks for reevaluation.  Could consider repeat OMT.  Could consider physical therapy.  Patient may ultimately require CSI or NSAID course which could be considered after pregnancy   Subjective:   I, Lindsey Stevenson, am serving as a Neurosurgeon for Doctor Richardean Sale  Chief  Complaint: right inguinal pain   HPI:   10/01/22 Patient is a 27 year old female complaining of right inguinal pain. Patient states that she has had the pain intermittently since 2018. OB hasn't found anything , ED told her it was maybe a sports hernia, she does states that she has increased activity. 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 No numbness and tingling. States that her right leg will get heavier. Feel as though her circulation is a little different when the pain is flared. Ibu and tylenol don't help. Did have a CT done   Relevant Historical Information: Current pregnancy  Additional pertinent review of systems negative.   Current Outpatient Medications:    acetaminophen (TYLENOL) 500 MG tablet, Take 500 mg by mouth every 6 (six) hours as needed for moderate pain., Disp: , Rfl:    cholecalciferol (VITAMIN D3) 25 MCG (1000 UNIT) tablet, Take 2,000 Units by mouth daily., Disp: , Rfl:    Prenatal Vit-Fe Fumarate-FA (PRENATAL MULTIVITAMIN) TABS tablet, Take 1 tablet by mouth daily at 12 noon., Disp: , Rfl:    Objective:     Vitals:   10/01/22 1524  BP: 118/68  Pulse: 89  SpO2: 97%  Weight: 164 lb (74.4 kg)  Height: 5\' 7"  (1.702 m)      Body mass index is 25.69 kg/m.    Physical Exam:    General: awake, alert, and oriented no acute distress, nontoxic Skin: no suspicious lesions or rashes Neuro:sensation intact  distally with no deficits, normal muscle tone, no atrophy, strength 5/5 in all tested lower ext groups Psych: normal mood and affect, speech clear   Right groin/hip: No deformity, swelling or wasting ROM Flexion 90, ext 30, IR 45, ER 45 TTP superior pubic rami on right, and NTTP on left NTTP over the hip flexors, greater trochanter, gluteal musculature, si joint, lumbar spine Negative log  roll with FROM Negative FABER Negative FADIR Negative Piriformis test Negative trendelenberg Gait normal  Right groin pain with resisted sit up and resisted right hip adduction  General: Well-appearing, cooperative, sitting comfortably in no acute distress.   OMT Physical Exam:  ASIS Compression Test: Positive Right Lower extremity: Tight hip adductor's, worse on right.  Muscle energy performed Lumbar: TTP paraspinal, L5 RL Pelvis: Right anterior innominate   Electronically signed by:  Lindsey Stevenson D.[facility] 4:13 PM 10/01/22

## 2022-10-01 NOTE — Patient Instructions (Signed)
tylenol 500 mg 2-3 times a day as needed Hernia HEP  5 week follow up

## 2022-10-24 ENCOUNTER — Encounter: Payer: Self-pay | Admitting: Internal Medicine

## 2022-10-24 ENCOUNTER — Ambulatory Visit: Payer: BC Managed Care – PPO | Attending: Internal Medicine | Admitting: Internal Medicine

## 2022-10-24 ENCOUNTER — Ambulatory Visit
Admission: EM | Admit: 2022-10-24 | Discharge: 2022-10-24 | Disposition: A | Discharge status: Home / Self Care | Payer: BC Managed Care – PPO

## 2022-10-24 VITALS — BP 110/60 | HR 98 | Ht 67.0 in | Wt 166.0 lb

## 2022-10-24 DIAGNOSIS — Z3A11 11 weeks gestation of pregnancy: Secondary | ICD-10-CM

## 2022-10-24 DIAGNOSIS — I471 Supraventricular tachycardia, unspecified: Secondary | ICD-10-CM

## 2022-10-24 DIAGNOSIS — J0140 Acute pansinusitis, unspecified: Secondary | ICD-10-CM

## 2022-10-24 DIAGNOSIS — H6591 Unspecified nonsuppurative otitis media, right ear: Secondary | ICD-10-CM

## 2022-10-24 MED ORDER — AMOXICILLIN 875 MG PO TABS: 875.0000 mg | ORAL_TABLET | Freq: Two times a day (BID) | ORAL | 0 refills | Status: DC

## 2022-10-24 MED ORDER — FLUTICASONE PROPIONATE 50 MCG/ACT NA SUSP: 1.0000 | Freq: Two times a day (BID) | NASAL | 0 refills | Status: DC | PRN

## 2022-10-24 NOTE — Progress Notes (Signed)
Cardiology Office Note:    Date:  10/24/2022   ID:  Lelon Mast, DOB [birth year], MRN [id number]  PCP:  Soundra Pilon, FNP   [facility] Providers Cardiologist:  Maisie Fus, MD     Referring MD: Soundra Pilon, FNP   No chief complaint on file. Palpitation  History of Present Illness:    Lindsey Stevenson is a 27 y.o. female with a hx of  G1P2 [redacted] weeks pregnant, uncomplicated delivery, anxiety, referral for palpitations  She states that she went to urgent care and was at work. She was dizzy and felt she would pass out. She found out later that she was pregnant. No syncope. She notes heart racing. She was exercising and her rates went up to 200 beats. She was having anxiety and panic attacks. She notes palpitations with COVID.  She notes recurrence with stress.  Her symptoms are not daily. She has a stressful job. No heart disease history. PGM has atrial fibrillation. No coronary dx family hx. No 1st degree with SCD. She stopped caffeine. No alcohol use. She is only taking the prenatal vitamin. No smoking.Her first pregnancy went well, no issues. 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  TSH 0.74 Crt 0.7  She returns after an ED visit 03/15/2021.  Patient states that this morning she had sudden onset palpitations with associated chest tightness and shortness of breath.  She states that her apple watch told her that her heart rate was approximately 210 bpm.  EMS was called who apparently also confirmed this heart rate.  EMS performed vagal maneuvers with the patient, blowing on a syringe, and the patient self aborted this rhythm. 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 She was recommended to treat with vagal maneuvers while pregnant. Considered AVNRT ablation after delivery. She is 15 weeks.   She has been doing well since she left the ED. She went back to school and has had some stress. She's having headaches. She had a mild episode yesterday of palpitations. She  has not had a syncopal event. It lasted for 20 minutes. She had an echocardiogram that was normal.   Interim hx 10/24/2022 She saw Dr. Ladona Ridgel Jan 2023, she was started on toprol, she was in her second trimester. ; per Dr. Ladona Ridgel plan was for her to stop her toprol 2 days prior to her scheduled delivery. They also discussed considering an EP study and ablation after delivery. She was seen in the [facility] clinic x2. She was seen after delivery 11/02/2021. She was breastfeeding.  She comes in today, she is [redacted] weeks pregnant. She has had some palpitations.  She feels SOB as well. She stopped taking BB , wants to avoid medication  Past Medical History:  Diagnosis Date   Anxiety    Depression    Migraine    SVT (supraventricular tachycardia)    Vaginal Pap smear, abnormal 2019   HPV    Past Surgical History:  Procedure Laterality Date   NO PAST SURGERIES      Current Medications: Current Outpatient Medications on File Prior to Visit  Medication Sig Dispense Refill   acetaminophen (TYLENOL) 500 MG tablet Take 500 mg by mouth every 6 (six) hours as needed for moderate pain.     cholecalciferol (VITAMIN D3) 25 MCG (1000 UNIT) tablet Take 2,000 Units by mouth daily.     Prenatal Vit-Fe Fumarate-FA (PRENATAL MULTIVITAMIN) TABS tablet Take 1 tablet by mouth daily at 12 noon.  No current facility-administered medications on file prior to visit.    Allergies:   Patient has no known allergies.   Social History   Socioeconomic History   Marital status: Married    Spouse name: Jared   Number of children: Not on file   Years of education: Not on file   Highest education level: Not on file  Occupational History   Not on file  Tobacco Use   Smoking status: Never   Smokeless tobacco: Never  Vaping Use   Vaping status: Never Used  Substance and Sexual Activity   Alcohol use: Not Currently   Drug use: Never   Sexual activity: Yes    Birth control/protection: None  Other Topics Concern    Not on file  Social History Narrative   Not on file   Social Determinants of Health   Financial Resource Strain: Not on file  Food Insecurity: Not on file  Transportation Needs: Not on file  Physical Activity: Not on file  Stress: Not on file  Social Connections: Not on file     Family History: The patient's family history includes Anxiety disorder in her mother; Arthritis in her maternal grandmother; Asthma in her brother; Cancer in her maternal grandfather and maternal grandmother; Diabetes in her brother; Early death in her sister; Hypertension in her father, mother, and paternal grandmother.  ROS:   Please see the history of present illness.     All other systems reviewed and are negative.  EKGs/Labs/Other Studies Reviewed:    The following studies were reviewed today:   EKG:  EKG is  ordered today.  The ekg ordered today demonstrates   NSR, no ischemic changes  NSR, PR 172 ms, Qtc 427 ms  EKG Interpretation Date/Time:  Thursday October 24 2022 16:17:15 EDT Ventricular Rate:  86 PR Interval:  182 QRS Duration:  96 QT Interval:  362 QTC Calculation: 433 R Axis:   90  Text Interpretation: Normal sinus rhythm Rightward axis When compared with ECG of 12-Jan-2022 10:54, PREVIOUS ECG IS PRESENT Confirmed by Carolan Clines ([id number]) on 10/24/2022 4:33:38 PM   Cardiology Studies 03/15/2021:TTE- EF 60-65%. No valve disease, no pulmonary hypertension  Recent Labs: 01/12/2022: Magnesium 1.8; TSH 0.487 09/01/2022: ALT 15; BUN 10; Creatinine, Ser 0.78; Hemoglobin 13.6; Platelets 264; Potassium 4.0; Sodium 137   Recent Lipid Panel No results found for: "CHOL", "TRIG", "HDL", "CHOLHDL", "VLDL", "LDLCALC", "LDLDIRECT"   Risk Assessment/Calculations:           Physical Exam:    VS:   Vitals:   10/24/22 1611  BP: 110/60  Pulse: 98  SpO2: 97%    LMP 08/06/2022     Wt Readings from Last 3 Encounters:  10/01/22 164 lb (74.4 kg)  09/23/22 161 lb 6.4 oz (73.2 kg)  09/07/22  160 lb 1.6 oz (72.6 kg)     GEN:  Well nourished, well developed in no acute distress HEENT: Normal NECK: No JVD; No carotid bruits LYMPHATICS: No lymphadenopathy CARDIAC: RRR, no murmurs, rubs, gallops RESPIRATORY:  Clear to auscultation without rales, wheezing or rhonchi  ABDOMEN: Soft, non-tender, non-distended MUSCULOSKELETAL:  No edema; No deformity  SKIN: Warm and dry NEUROLOGIC:  Alert and oriented x 3 PSYCHIATRIC:  Normal affect   ASSESSMENT:    #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 Due to their infrequent nature and no high risk features we planned to watch it. She presented in December 2023 with recurrent symptoms to the ED.  EMS strip  reviewed by Dr. 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 She was on toprol at one point after I saw her. This has since been stopped. She is now pregnant again and wants to avoid medications. Will continue to monitor and she can consider EP study and ablation after delivery.  PLAN:    In order of problems listed above:   Follow up in 6 months      Medication Adjustments/Labs and Tests Ordered: Current medicines are reviewed at length with the patient today.  Concerns regarding medicines are outlined above.    Signed, Maisie Fus, MD  10/24/2022 12:09 PM    [facility]

## 2022-10-24 NOTE — ED Triage Notes (Addendum)
Pt c/o right sided headache and earache x 1 week. Pt states she had a cold 2 weeks ago and after the cold she noticed facial pain and neck pain. Pt has a hx of migraines and is currently pregnant.

## 2022-10-24 NOTE — ED Provider Notes (Signed)
[facility]    CSN: [id number] Arrival date & time: 10/24/22  0802      History   Chief Complaint No chief complaint on file.   HPI Lindsey Stevenson is a 27 y.o. female.   HPI Presents today for evaluation of right facial pressure and headache along with right ear pain.  Patient reports over the course of the last week she has been sick with upper respiratory symptoms including cough and severe congestion.  She reports most of the symptoms have resolved or improved however she has developed worsening of right ear pain and facial pressure glassed to the right side of the face.  To her knowledge she has not had fever.  Patient is [redacted] weeks pregnant.    Past Medical History:  Diagnosis Date   Anxiety    Depression    Migraine    SVT (supraventricular tachycardia)    Vaginal Pap smear, abnormal 2019   HPV    Patient Active Problem List   Diagnosis Date Noted   PSVT (paroxysmal supraventricular tachycardia) 11/05/2021   Elevated blood pressure reading 11/05/2021   Premature rupture of membranes 09/24/2021   SVT (supraventricular tachycardia) 05/13/2021   Health education/counseling 05/13/2021   Indication for care in labor or delivery 08/07/2019    Past Surgical History:  Procedure Laterality Date   NO PAST SURGERIES      OB History     Gravida  3   Para  2   Term  2   Preterm      AB      Living  2      SAB      IAB      Ectopic      Multiple  0   Live Births  2            Home Medications    Prior to Admission medications   Medication Sig Start Date End Date Taking? Authorizing Provider  amoxicillin (AMOXIL) 875 MG tablet Take 1 tablet (875 mg total) by mouth 2 (two) times daily. 10/24/22  Yes Bing Neighbors, NP  fluticasone (FLONASE) 50 MCG/ACT nasal spray Place 1 spray into both nostrils 2 (two) times daily as needed for allergies or rhinitis. 10/24/22  Yes Bing Neighbors, NP  magnesium chloride (SLOW-MAG) 64 MG TBEC SR  tablet Take 1 tablet by mouth once a week.   Yes [provider]  acetaminophen (TYLENOL) 500 MG tablet Take 500 mg by mouth every 6 (six) hours as needed for moderate pain.    [provider]  cholecalciferol (VITAMIN D3) 25 MCG (1000 UNIT) tablet Take 2,000 Units by mouth daily.    [provider]  Prenatal Vit-Fe Fumarate-FA (PRENATAL MULTIVITAMIN) TABS tablet Take 1 tablet by mouth daily at 12 noon.    [provider]    Family History Family History  Problem Relation Age of Onset   Cancer Maternal Grandmother    Arthritis Maternal Grandmother    Cancer Maternal Grandfather    Hypertension Paternal Grandmother    Anxiety disorder Mother    Hypertension Mother    Hypertension Father    Asthma Brother    Diabetes Brother    Early death Sister     Social History Social History   Tobacco Use   Smoking status: Never   Smokeless tobacco: Never  Vaping Use   Vaping status: Never Used  Substance Use Topics   Alcohol use: Not Currently   Drug use: Never  Allergies   Patient has no known allergies.   Review of Systems Review of Systems Pertinent negatives listed in HPI   Physical Exam Triage Vital Signs ED Triage Vitals  Encounter Vitals Group     BP 10/24/22 0817 112/79     Systolic BP Percentile --      Diastolic BP Percentile --      Pulse Rate 10/24/22 0817 94     Resp 10/24/22 0817 13     Temp 10/24/22 0817 98.8 F (37.1 C)     Temp Source 10/24/22 0817 Oral     SpO2 10/24/22 0817 98 %     Weight --      Height --      Head Circumference --      Peak Flow --      Pain Score 10/24/22 0823 4     Pain Loc --      Pain Education --      Exclude from Growth Chart --    No data found.  Updated Vital Signs BP 112/79 (BP Location: Left Arm)   Pulse 94   Temp 98.8 F (37.1 C) (Oral)   Resp 13   LMP 08/06/2022   SpO2 98%   Breastfeeding No   Visual Acuity Right Eye Distance:   Left Eye Distance:   Bilateral  Distance:    Right Eye Near:   Left Eye Near:    Bilateral Near:     Physical Exam Vitals reviewed.  Constitutional:      Appearance: Normal appearance.  HENT:     Head: Normocephalic and atraumatic.     Jaw: Tenderness present.     Right Ear: Swelling and tenderness present. A middle ear effusion is present. Tympanic membrane is erythematous and bulging.     Left Ear: Hearing, tympanic membrane and ear canal normal.     Nose: Congestion and rhinorrhea present.     Right Sinus: Maxillary sinus tenderness and frontal sinus tenderness present.     Left Sinus: No maxillary sinus tenderness or frontal sinus tenderness.  Eyes:     Extraocular Movements: Extraocular movements intact.     Pupils: Pupils are equal, round, and reactive to light.  Cardiovascular:     Rate and Rhythm: Normal rate and regular rhythm.  Pulmonary:     Effort: Pulmonary effort is normal.     Breath sounds: Normal breath sounds.  Musculoskeletal:        General: Normal range of motion.  Skin:    General: Skin is warm and dry.  Neurological:     General: No focal deficit present.     Mental Status: She is alert.      UC Treatments / Results  Labs (all labs ordered are listed, but only abnormal results are displayed) Labs Reviewed - No data to display  EKG   Radiology No results found.  Procedures Procedures (including critical care time)  Medications Ordered in UC Medications - No data to display  Initial Impression / Assessment and Plan / UC Course  I have reviewed the triage vital signs and the nursing notes.  Pertinent labs & imaging results that were available during my care of the patient were reviewed by me and considered in my medical decision making (see chart for details).   Treatment for acute pansinusitis and right otitis media Treatment per discharge medication orders.  Patient encouraged to hydrate well with fluids.  Patient advised to follow-up with PCP or return here for  evaluation if symptoms worsen or do not improve.  Patient verbalized understanding and agreement with plan today. Final Clinical Impressions(s) / UC Diagnoses   Final diagnoses:  Acute non-recurrent pansinusitis  Right non-suppurative otitis media  [redacted] weeks gestation of pregnancy   Discharge Instructions   None    ED Prescriptions     Medication Sig Dispense Auth. Provider   amoxicillin (AMOXIL) 875 MG tablet Take 1 tablet (875 mg total) by mouth 2 (two) times daily. 20 tablet Bing Neighbors, NP   fluticasone (FLONASE) 50 MCG/ACT nasal spray Place 1 spray into both nostrils 2 (two) times daily as needed for allergies or rhinitis. 9.9 mL Bing Neighbors, NP      PDMP not reviewed this encounter.   Bing Neighbors, NP 10/24/22 [phone]

## 2022-10-24 NOTE — Patient Instructions (Signed)
Medication Instructions:  Your physician recommends that you continue on your current medications as directed. Please refer to the Current Medication list given to you today.  *If you need a refill on your cardiac medications before your next appointment, please call your pharmacy*  Follow-Up: At [facility], you and your health needs are our priority.  As part of our continuing mission to provide you with exceptional heart care, we have created designated Provider Care Teams.  These Care Teams include your primary Cardiologist (physician) and Advanced Practice Providers (APPs -  Physician Assistants and Nurse Practitioners) who all work together to provide you with the care you need, when you need it.    Your next appointment:   6 month(s)  Provider:   Maisie Fus, MD

## 2022-10-27 NOTE — Progress Notes (Unsigned)
[facility]  PATIENT: Lindsey Stevenson DOB: April 07, 1995  REFERRING DOCTOR OR PCP: Peri Maris FNP SOURCE: Patient, notes from primary care, imaging and lab reports, MRI personally reviewed.  _________________________________   HISTORICAL  CHIEF COMPLAINT:  Chief Complaint  Patient presents with   Room 11    Pt is here with her 2 sons. Pt is [redacted] weeks pregnant right now. Pt states that her numbness and tingling is in her right leg. Pt states that she has some headaches.     HISTORY OF PRESENT ILLNESS:  I had the pleasure of seeing a patient, Lindsey Stevenson, at [facility] for neurologic consultation regarding her numbness and other symptoms.   She is a 27 year old woman who is [redacted] weeks pregnant now reporting numbness and tingling in the legs, right > left.    She feels it mostly in the lower leg.  She has dysesthesias with allodynia - touch feels prickly.   The abnormal sensations started about 7 months ago (November 2023 but then improved a mont later and re-occurred after Covid-19 in June 2024  She also has had spells of altered smell and visual aura without headache.   She has a history of migraine, usually triggered during her cycle or when hungry.    Migraines can last 2-3 days and occur 15-16 days/month.   She is currently taking Tylenol when one occurs as she is pregnant.        She has had sleep paralysis since 2021.  These occur up to 3 times a week, worse when she has stress.    She denies hypersomnia.   She has some vivid dreams but not too frequently.  She does not have cataplexy.      Imaging: MRI of 11/23/2017 was normal. Incidental note of mild mucoperiosteal thickening in the right hemisphenoid sinus.   REVIEW OF SYSTEMS: Constitutional: No fevers, chills, sweats, or change in appetite Eyes: No visual changes, double vision, eye pain Ear, nose and throat: No hearing loss, ear pain, nasal congestion, sore throat Cardiovascular:  No chest pain, palpitations Respiratory:  No shortness of breath at rest or with exertion.   No wheezes GastrointestinaI: No nausea, vomiting, diarrhea, abdominal pain, fecal incontinence Genitourinary:  No dysuria, urinary retention or frequency.  No nocturia. Musculoskeletal:  No neck pain, back pain Integumentary: No rash, pruritus, skin lesions Neurological: as above Psychiatric: No depression at this time.  No anxiety Endocrine: No palpitations, diaphoresis, change in appetite, change in weigh or increased thirst Hematologic/Lymphatic:  No anemia, purpura, petechiae. Allergic/Immunologic: No itchy/runny eyes, nasal congestion, recent allergic reactions, rashes  ALLERGIES: No Known Allergies  HOME MEDICATIONS:  Current Outpatient Medications:    acetaminophen (TYLENOL) 500 MG tablet, Take 500 mg by mouth every 6 (six) hours as needed for moderate pain., Disp: , Rfl:    amoxicillin (AMOXIL) 875 MG tablet, Take 1 tablet (875 mg total) by mouth 2 (two) times daily., Disp: 20 tablet, Rfl: 0   cholecalciferol (VITAMIN D3) 25 MCG (1000 UNIT) tablet, Take 2,000 Units by mouth daily., Disp: , Rfl:    fluticasone (FLONASE) 50 MCG/ACT nasal spray, Place 1 spray into both nostrils 2 (two) times daily as needed for allergies or rhinitis., Disp: 9.9 mL, Rfl: 0   Prenatal Vit-Fe Fumarate-FA (PRENATAL MULTIVITAMIN) TABS tablet, Take 1 tablet by mouth daily at 12 noon., Disp: , Rfl:   PAST MEDICAL HISTORY: Past Medical History:  Diagnosis Date   Anxiety    Depression  Migraine    SVT (supraventricular tachycardia)    Vaginal Pap smear, abnormal 2019   HPV    PAST SURGICAL HISTORY: Past Surgical History:  Procedure Laterality Date   NO PAST SURGERIES      FAMILY HISTORY: Family History  Problem Relation Age of Onset   Cancer Maternal Grandmother    Arthritis Maternal Grandmother    Cancer Maternal Grandfather    Hypertension Paternal Grandmother    Anxiety disorder Mother     Hypertension Mother    Hypertension Father    Asthma Brother    Diabetes Brother    Early death Sister     SOCIAL HISTORY: Social History   Socioeconomic History   Marital status: Married    Spouse name: Jared   Number of children: 3   Years of education: Not on file   Highest education level: Not on file  Occupational History   Not on file  Tobacco Use   Smoking status: Never   Smokeless tobacco: Never  Vaping Use   Vaping status: Never Used  Substance and Sexual Activity   Alcohol use: Not Currently   Drug use: Never   Sexual activity: Yes    Birth control/protection: None  Other Topics Concern   Not on file  Social History Narrative   Right Handed    No Caffeine    Social Determinants of Health   Financial Resource Strain: Not on file  Food Insecurity: Not on file  Transportation Needs: Not on file  Physical Activity: Not on file  Stress: Not on file  Social Connections: Not on file  Intimate Partner Violence: Not on file       PHYSICAL EXAM  Vitals:   10/29/22 1025  BP: 110/68  Pulse: 85  Weight: 161 lb 8 oz (73.3 kg)  Height: 5\' 7"  (1.702 m)    Body mass index is 25.29 kg/m.   General: The patient is well-developed and well-nourished and in no acute distress  HEENT:  Head is [place]/AT.  Sclera are anicteric.   Neck: No carotid bruits are noted.  The neck is nontender.  Cardiovascular: The heart has a regular rate and rhythm with a normal S1 and S2. There were no murmurs, gallops or rubs.    Skin: Extremities are without rash or  edema.  Musculoskeletal:  Back is nontender  Neurologic Exam  Mental status: The patient is alert and oriented x 3 at the time of the examination. The patient has apparent normal recent and remote memory, with an apparently normal attention span and concentration ability.   Speech is normal.  Cranial nerves: Extraocular movements are full. Pupils are equal, round, and reactive to light and accomodation.     Facial  symmetry is present. There is good facial sensation to soft touch bilaterally.Facial strength is normal.  Trapezius and sternocleidomastoid strength is normal. No dysarthria is noted.   No obvious hearing deficits are noted.  Motor:  Muscle bulk is normal.   Tone is normal. Strength is  5 / 5 in all 4 extremities.   Sensory: Sensory testing is intact to pinprick, soft touch and vibration sensation in all 4 extremities.  Coordination: Cerebellar testing reveals good finger-nose-finger and heel-to-shin bilaterally.  Gait and station: Station is normal.   Gait is normal. Tandem gait is normal. Romberg is negative.   Reflexes: Deep tendon reflexes are symmetric and normal bilaterally.   Plantar responses are flexor.    DIAGNOSTIC DATA (LABS, IMAGING, TESTING) - I reviewed patient  records, labs, notes, testing and imaging myself where available.  Lab Results  Component Value Date   WBC 9.3 09/01/2022   HGB 13.6 09/01/2022   HCT 39.6 09/01/2022   MCV 87.8 09/01/2022   PLT 264 09/01/2022      Component Value Date/Time   NA 137 09/01/2022 1641   K 4.0 09/01/2022 1641   CL 105 09/01/2022 1641   CO2 23 09/01/2022 1641   GLUCOSE 105 (H) 09/01/2022 1641   BUN 10 09/01/2022 1641   CREATININE 0.78 09/01/2022 1641   CALCIUM 10.1 09/01/2022 1641   PROT 7.4 09/01/2022 1641   ALBUMIN 4.5 09/01/2022 1641   AST 14 (L) 09/01/2022 1641   ALT 15 09/01/2022 1641   ALKPHOS 50 09/01/2022 1641   BILITOT 0.5 09/01/2022 1641   GFRNONAA >60 09/01/2022 1641   GFRAA >60 07/29/2019 1637    Lab Results  Component Value Date   TSH 0.487 01/12/2022       ASSESSMENT AND PLAN  Numbness  Chronic migraine with aura without status migrainosus, not intractable  Sleep paralysis  [redacted] weeks gestation of pregnancy   In summary, Ms. Auker is a 27 year old woman who has had intermittent dysesthesias with allodynia in the legs, right greater than left.  On examination today, she actually had  symmetric sensation and strength and reflexes in the legs.  I think the likelihood that this is a serious problem is small.  Due to her pregnancy, I would hold off any imaging studies.  However, if symptoms significantly worsen to include problems with gait or worsening bladder function we may need to check an MRI of the spinal cord to make sure that there was not a demyelinating plaque or other issue.  Since symptoms fluctuate and she is pregnant I would not recommend any treatment for the dysesthesia pain, at least at this time.  An additional problem is migraine headaches.  She is getting a lot of aura without migraine but still experiences migraine pain about 16 days a month.  Therefore, she would be a candidate for prophylactic treatment but I will hold off till after her pregnancy.  In the meantime she will take Tylenol when a migraine occurs.  I will see her back in 7 months after her delivery and we could consider treatment for chronic migraine at that time such as Topamax, tricyclic or anti-CGRP agent.  A third problem is spells of sleep paralysis.  She has no other symptom or sign of narcolepsy.  Sleep paralysis does not require treatment but melatonin is sometimes helpful.  If the spells persist, after she delivers, we could also consider nortriptyline or protriptyline.  She will return to see me in 7 months or sooner if there are new or worsening neurologic symptoms.   Kahla Risdon A. Epimenio Foot, MD, [facility] 10/29/2022, 11:12 AM Certified in Neurology, Clinical Neurophysiology, Sleep Medicine and Neuroimaging  [facility] [address], [address] [place], [place] [address] [phone]

## 2022-10-29 ENCOUNTER — Encounter: Payer: Self-pay | Admitting: Neurology

## 2022-10-29 ENCOUNTER — Ambulatory Visit (INDEPENDENT_AMBULATORY_CARE_PROVIDER_SITE_OTHER): Payer: BC Managed Care – PPO | Admitting: Neurology

## 2022-10-29 VITALS — BP 110/68 | HR 85 | Ht 67.0 in | Wt 161.5 lb

## 2022-10-29 DIAGNOSIS — G478 Other sleep disorders: Secondary | ICD-10-CM | POA: Diagnosis not present

## 2022-10-29 DIAGNOSIS — G43E09 Chronic migraine with aura, not intractable, without status migrainosus: Secondary | ICD-10-CM

## 2022-10-29 DIAGNOSIS — Z3A12 12 weeks gestation of pregnancy: Secondary | ICD-10-CM

## 2022-10-29 DIAGNOSIS — R200 Anesthesia of skin: Secondary | ICD-10-CM | POA: Diagnosis not present

## 2022-11-04 NOTE — Progress Notes (Deleted)
    Lindsey Stevenson D.[facility] [address] [place] [address] Phone: [phone]   Assessment and Plan:     There are no diagnoses linked to this encounter.  ***   Pertinent previous records reviewed include ***   Follow Up: ***     Subjective:   I,  , am serving as a Neurosurgeon for Doctor Richardean Sale   Chief Complaint: right inguinal pain    HPI:    10/01/22 Patient is a 27 year old female complaining of right inguinal pain. Patient states that she has had the pain intermittently since 2018. OB hasn't found anything , ED told her it was maybe a sports hernia, she does states that she has increased activity. 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 No numbness and tingling. States that her right leg will get heavier. Feel as though her circulation is a little different when the pain is flared. Ibu and tylenol don't help. Did have a CT done   11/05/2022 Patient states    Relevant Historical Information: Current pregnancy  Additional pertinent review of systems negative.   Current Outpatient Medications:    acetaminophen (TYLENOL) 500 MG tablet, Take 500 mg by mouth every 6 (six) hours as needed for moderate pain., Disp: , Rfl:    amoxicillin (AMOXIL) 875 MG tablet, Take 1 tablet (875 mg total) by mouth 2 (two) times daily., Disp: 20 tablet, Rfl: 0   cholecalciferol (VITAMIN D3) 25 MCG (1000 UNIT) tablet, Take 2,000 Units by mouth daily., Disp: , Rfl:    fluticasone (FLONASE) 50 MCG/ACT nasal spray, Place 1 spray into both nostrils 2 (two) times daily as needed for allergies or rhinitis., Disp: 9.9 mL, Rfl: 0   Prenatal Vit-Fe Fumarate-FA (PRENATAL MULTIVITAMIN) TABS tablet, Take 1 tablet by mouth daily at 12 noon., Disp: , Rfl:    Objective:     There  were no vitals filed for this visit.    There is no height or weight on file to calculate BMI.    Physical Exam:    ***   Electronically signed by:  Lindsey Stevenson [facility] 11:20 AM 11/04/22

## 2022-11-05 ENCOUNTER — Ambulatory Visit: Payer: BC Managed Care – PPO | Admitting: Sports Medicine

## 2022-11-06 ENCOUNTER — Ambulatory Visit (INDEPENDENT_AMBULATORY_CARE_PROVIDER_SITE_OTHER): Payer: BC Managed Care – PPO | Admitting: Sports Medicine

## 2022-11-06 VITALS — BP 120/80 | HR 88 | Ht 67.0 in | Wt 163.0 lb

## 2022-11-06 DIAGNOSIS — S3981XD Other specified injuries of abdomen, subsequent encounter: Secondary | ICD-10-CM

## 2022-11-06 DIAGNOSIS — M9905 Segmental and somatic dysfunction of pelvic region: Secondary | ICD-10-CM | POA: Diagnosis not present

## 2022-11-06 DIAGNOSIS — Z3A13 13 weeks gestation of pregnancy: Secondary | ICD-10-CM | POA: Diagnosis not present

## 2022-11-06 DIAGNOSIS — M9903 Segmental and somatic dysfunction of lumbar region: Secondary | ICD-10-CM

## 2022-11-06 DIAGNOSIS — M9906 Segmental and somatic dysfunction of lower extremity: Secondary | ICD-10-CM

## 2022-11-06 NOTE — Progress Notes (Signed)
Lindsey Stevenson D[facility] [address] [place] [address] Phone: [phone]   Assessment and Plan:     1. Sports hernia, subsequent encounter 2. [redacted] weeks gestation of pregnancy 3. Somatic dysfunction of lumbar region 4. Somatic dysfunction of pelvic region 5. Somatic dysfunction of lower extremities  -Chronic with exacerbation, subsequent visit - Still most consistent with sports hernia at right superior pubic rami, initially from physical activity and running activities that did not fully heal - Do not recommend NSAID use due to current pregnancy - Continue Tylenol 500 mg 2-3 times a day as needed for pain relief - Continue HEP - Patient has received relief with OMT in the past.  Elects for repeat OMT today.  Tolerated well per note below. - Decision today to treat with OMT was based on Physical Exam  After verbal consent patient was treated with HVLA (high velocity low amplitude), ME (muscle energy), FPR (flex positional release), ST (soft tissue), PC/PD (Pelvic Compression/ Pelvic Decompression) techniques in lower extremity, lumbar, and pelvic areas. Patient tolerated the procedure well with improvement in symptoms.  Patient educated on potential side effects of soreness and recommended to rest, hydrate, and use Tylenol as needed for pain control.   Pertinent previous records reviewed include neurology note 10/29/2022, ER note 10/24/22, cardiology note 10/24/2022   Follow Up: 4 weeks for reevaluation.  Could consider repeat OMT   Subjective:   I, Lindsey Stevenson, am serving as a Neurosurgeon for Doctor Richardean Sale   Chief Complaint: right inguinal pain    HPI:    10/01/22 Patient is a 27 year old female complaining of right inguinal pain. Patient states that she has had the pain intermittently since 2018. OB hasn't found anything , ED told her it was maybe a sports hernia, she does states that she has increased activity. Does have two  children that she lifts, she can't walk for ling periods of time where she will be doubled over in pain.when the pain is really bad it radiates to the back but the pain is usually in the same area. No numbness and tingling. States that her right leg will get heavier. Feel as though her circulation is a little different when the pain is flared. Ibu and tylenol don't help. Did have a CT done   11/06/2022 Patient states that she has been okay    Relevant Historical Information: Current pregnancy  Additional pertinent review of systems negative.   Current Outpatient Medications:    acetaminophen (TYLENOL) 500 MG tablet, Take 500 mg by mouth every 6 (six) hours as needed for moderate pain., Disp: , Rfl:    amoxicillin (AMOXIL) 875 MG tablet, Take 1 tablet (875 mg total) by mouth 2 (two) times daily., Disp: 20 tablet, Rfl: 0   cholecalciferol (VITAMIN D3) 25 MCG (1000 UNIT) tablet, Take 2,000 Units by mouth daily., Disp: , Rfl:    fluticasone (FLONASE) 50 MCG/ACT nasal spray, Place 1 spray into both nostrils 2 (two) times daily as needed for allergies or rhinitis., Disp: 9.9 mL, Rfl: 0   Prenatal Vit-Fe Fumarate-FA (PRENATAL MULTIVITAMIN) TABS tablet, Take 1 tablet by mouth daily at 12 noon., Disp: , Rfl:    Objective:     Vitals:   11/06/22 1104  BP: 120/80  Pulse: 88  SpO2: 99%  Weight: 163 lb (73.9 kg)  Height: 5\' 7"  (1.702 m)      Body mass index is 25.53 kg/m.    Physical Exam:  General: awake, alert, and oriented no acute distress, nontoxic Skin: no suspicious lesions or rashes Neuro:sensation intact distally with no deficits, normal muscle tone, no atrophy, strength 5/5 in all tested lower ext groups Psych: normal mood and affect, speech clear   Right groin/hip: No deformity, swelling or wasting ROM Flexion 90, ext 30, IR 45, ER 45 TTP superior pubic rami on right, and NTTP on left NTTP over the hip flexors, greater trochanter, gluteal musculature, si joint, lumbar  spine Negative log roll with FROM Negative FABER Negative FADIR Negative Piriformis test Negative trendelenberg Gait normal  Right groin pain with resisted sit up and resisted right hip adduction   General: Well-appearing, cooperative, sitting comfortably in no acute distress.   OMT Physical Exam:   ASIS Compression Test: Positive Right Lower extremity: Tight hip adductor's, worse on right.  Muscle energy performed Lumbar: TTP paraspinal, L5 RL Pelvis: Right anterior innominate     Electronically signed by:  Lindsey Stevenson [facility] 11:51 AM 11/06/22

## 2022-12-03 NOTE — Progress Notes (Deleted)
   Lindsey Stevenson [facility] [address] [place] [address] Phone: [phone]   Assessment and Plan:     There are no diagnoses linked to this encounter.  *** - Patient has received relief with OMT in the past.  Elects for repeat OMT today.  Tolerated well per note below. - Decision today to treat with OMT was based on Physical Exam   After verbal consent patient was treated with HVLA (high velocity low amplitude), ME (muscle energy), FPR (flex positional release), ST (soft tissue), PC/PD (Pelvic Compression/ Pelvic Decompression) techniques in cervical, rib, thoracic, lumbar, and pelvic areas. Patient tolerated the procedure well with improvement in symptoms.  Patient educated on potential side effects of soreness and recommended to rest, hydrate, and use Tylenol as needed for pain control.   Pertinent previous records reviewed include ***   Follow Up: ***     Subjective:   I, Lindsey Stevenson, am serving as a Neurosurgeon for Doctor Richardean Sale   Chief Complaint: right inguinal pain    HPI:    10/01/22 Patient is a 27 year old female complaining of right inguinal pain. Patient states that she has had the pain intermittently since 2018. OB hasn't found anything , ED told her it was maybe a sports hernia, she does states that she has increased activity. 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 No numbness and tingling. States that her right leg will get heavier. Feel as though her circulation is a little different when the pain is flared. Ibu and tylenol don't help. Did have a CT done    11/06/2022 Patient states that she has been okay    12/04/2022 Patient states   Relevant Historical Information: Current pregnancy  Additional pertinent review of systems negative.  Current Outpatient  Medications  Medication Sig Dispense Refill   acetaminophen (TYLENOL) 500 MG tablet Take 500 mg by mouth every 6 (six) hours as needed for moderate pain.     amoxicillin (AMOXIL) 875 MG tablet Take 1 tablet (875 mg total) by mouth 2 (two) times daily. 20 tablet 0   cholecalciferol (VITAMIN D3) 25 MCG (1000 UNIT) tablet Take 2,000 Units by mouth daily.     fluticasone (FLONASE) 50 MCG/ACT nasal spray Place 1 spray into both nostrils 2 (two) times daily as needed for allergies or rhinitis. 9.9 mL 0   Prenatal Vit-Fe Fumarate-FA (PRENATAL MULTIVITAMIN) TABS tablet Take 1 tablet by mouth daily at 12 noon.     No current facility-administered medications for this visit.      Objective:     There were no vitals filed for this visit.    There is no height or weight on file to calculate BMI.    Physical Exam:     General: Well-appearing, cooperative, sitting comfortably in no acute distress.   OMT Physical Exam:  ASIS Compression Test: Positive Right Cervical: TTP paraspinal, *** Rib: Bilateral elevated first rib with TTP Thoracic: TTP paraspinal,*** Lumbar: TTP paraspinal,*** Pelvis: Right anterior innominate  Electronically signed by:  Lindsey Stevenson [facility] 7:14 AM 12/03/22

## 2022-12-04 ENCOUNTER — Ambulatory Visit: Payer: BC Managed Care – PPO | Admitting: Sports Medicine

## 2022-12-04 NOTE — Progress Notes (Deleted)
   Aleen Sells D.[facility] [address] [place] [address] Phone: [phone]   Assessment and Plan:     There are no diagnoses linked to this encounter.  *** - Patient has received relief with OMT in the past.  Elects for repeat OMT today.  Tolerated well per note below. - Decision today to treat with OMT was based on Physical Exam   After verbal consent patient was treated with HVLA (high velocity low amplitude), ME (muscle energy), FPR (flex positional release), ST (soft tissue), PC/PD (Pelvic Compression/ Pelvic Decompression) techniques in cervical, rib, thoracic, lumbar, and pelvic areas. Patient tolerated the procedure well with improvement in symptoms.  Patient educated on potential side effects of soreness and recommended to rest, hydrate, and use Tylenol as needed for pain control.   Pertinent previous records reviewed include ***   Follow Up: ***     Subjective:   I, Jestin Burbach, am serving as a Neurosurgeon for Doctor Richardean Sale   Chief Complaint: right inguinal pain    HPI:    10/01/22 Patient is a 27 year old female complaining of right inguinal pain. Patient states that she has had the pain intermittently since 2018. OB hasn't found anything , ED told her it was maybe a sports hernia, she does states that she has increased activity. 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 No numbness and tingling. States that her right leg will get heavier. Feel as though her circulation is a little different when the pain is flared. Ibu and tylenol don't help. Did have a CT done    11/06/2022 Patient states that she has been okay    12/05/2022 Patient states   Relevant Historical Information: Current pregnancy  Additional pertinent review of systems negative.  Current Outpatient  Medications  Medication Sig Dispense Refill   acetaminophen (TYLENOL) 500 MG tablet Take 500 mg by mouth every 6 (six) hours as needed for moderate pain.     amoxicillin (AMOXIL) 875 MG tablet Take 1 tablet (875 mg total) by mouth 2 (two) times daily. 20 tablet 0   cholecalciferol (VITAMIN D3) 25 MCG (1000 UNIT) tablet Take 2,000 Units by mouth daily.     fluticasone (FLONASE) 50 MCG/ACT nasal spray Place 1 spray into both nostrils 2 (two) times daily as needed for allergies or rhinitis. 9.9 mL 0   Prenatal Vit-Fe Fumarate-FA (PRENATAL MULTIVITAMIN) TABS tablet Take 1 tablet by mouth daily at 12 noon.     No current facility-administered medications for this visit.      Objective:     There were no vitals filed for this visit.    There is no height or weight on file to calculate BMI.    Physical Exam:     General: Well-appearing, cooperative, sitting comfortably in no acute distress.   OMT Physical Exam:  ASIS Compression Test: Positive Right Cervical: TTP paraspinal, *** Rib: Bilateral elevated first rib with TTP Thoracic: TTP paraspinal,*** Lumbar: TTP paraspinal,*** Pelvis: Right anterior innominate  Electronically signed by:  Aleen Sells [facility] 4:27 PM 12/04/22

## 2022-12-05 ENCOUNTER — Ambulatory Visit: Payer: BC Managed Care – PPO | Admitting: Sports Medicine

## 2022-12-09 NOTE — Progress Notes (Unsigned)
   Lindsey Stevenson [facility] [address] [place] [address] Phone: [phone]   Assessment and Plan:     There are no diagnoses linked to this encounter.  *** - Patient has received relief with OMT in the past.  Elects for repeat OMT today.  Tolerated well per note below. - Decision today to treat with OMT was based on Physical Exam   After verbal consent patient was treated with HVLA (high velocity low amplitude), ME (muscle energy), FPR (flex positional release), ST (soft tissue), PC/PD (Pelvic Compression/ Pelvic Decompression) techniques in cervical, rib, thoracic, lumbar, and pelvic areas. Patient tolerated the procedure well with improvement in symptoms.  Patient educated on potential side effects of soreness and recommended to rest, hydrate, and use Tylenol as needed for pain control.   Pertinent previous records reviewed include ***   Follow Up: ***     Subjective:   I, Lindsey Stevenson, am serving as a Neurosurgeon for Doctor Richardean Sale   Chief Complaint: right inguinal pain    HPI:    10/01/22 Patient is a 27 year old female complaining of right inguinal pain. Patient states that she has had the pain intermittently since 2018. OB hasn't found anything , ED told her it was maybe a sports hernia, she does states that she has increased activity. 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 No numbness and tingling. States that her right leg will get heavier. Feel as though her circulation is a little different when the pain is flared. Ibu and tylenol don't help. Did have a CT done    11/06/2022 Patient states that she has been okay    12/10/2022 Patient states    Relevant Historical Information: Current pregnancy  Additional pertinent review of systems negative.  Current  Outpatient Medications  Medication Sig Dispense Refill   acetaminophen (TYLENOL) 500 MG tablet Take 500 mg by mouth every 6 (six) hours as needed for moderate pain.     amoxicillin (AMOXIL) 875 MG tablet Take 1 tablet (875 mg total) by mouth 2 (two) times daily. 20 tablet 0   cholecalciferol (VITAMIN D3) 25 MCG (1000 UNIT) tablet Take 2,000 Units by mouth daily.     fluticasone (FLONASE) 50 MCG/ACT nasal spray Place 1 spray into both nostrils 2 (two) times daily as needed for allergies or rhinitis. 9.9 mL 0   Prenatal Vit-Fe Fumarate-FA (PRENATAL MULTIVITAMIN) TABS tablet Take 1 tablet by mouth daily at 12 noon.     No current facility-administered medications for this visit.      Objective:     There were no vitals filed for this visit.    There is no height or weight on file to calculate BMI.    Physical Exam:     General: Well-appearing, cooperative, sitting comfortably in no acute distress.   OMT Physical Exam:  ASIS Compression Test: Positive Right Cervical: TTP paraspinal, *** Rib: Bilateral elevated first rib with TTP Thoracic: TTP paraspinal,*** Lumbar: TTP paraspinal,*** Pelvis: Right anterior innominate  Electronically signed by:  Lindsey Stevenson [facility] 4:20 PM 12/09/22

## 2022-12-10 ENCOUNTER — Ambulatory Visit (INDEPENDENT_AMBULATORY_CARE_PROVIDER_SITE_OTHER): Payer: Medicaid Other | Admitting: Sports Medicine

## 2022-12-10 VITALS — BP 118/68 | HR 92 | Ht 67.0 in | Wt 160.0 lb

## 2022-12-10 DIAGNOSIS — Z3A18 18 weeks gestation of pregnancy: Secondary | ICD-10-CM

## 2022-12-10 DIAGNOSIS — M545 Low back pain, unspecified: Secondary | ICD-10-CM

## 2022-12-10 DIAGNOSIS — S3981XD Other specified injuries of abdomen, subsequent encounter: Secondary | ICD-10-CM

## 2022-12-10 DIAGNOSIS — M9905 Segmental and somatic dysfunction of pelvic region: Secondary | ICD-10-CM

## 2022-12-10 DIAGNOSIS — M9903 Segmental and somatic dysfunction of lumbar region: Secondary | ICD-10-CM | POA: Diagnosis not present

## 2022-12-10 DIAGNOSIS — M9906 Segmental and somatic dysfunction of lower extremity: Secondary | ICD-10-CM

## 2023-01-07 ENCOUNTER — Ambulatory Visit: Payer: BC Managed Care – PPO | Admitting: Sports Medicine

## 2023-01-09 NOTE — Progress Notes (Signed)
Lindsey Stevenson [facility] [address] [place] [address] Phone: [phone]   Assessment and Plan:     1. Sports hernia, subsequent encounter 2. Acute bilateral low back pain without sciatica 3. [redacted] weeks gestation of pregnancy 4. Somatic dysfunction of lumbar region 5. Somatic dysfunction of pelvic region 6. Somatic dysfunction of lower extremities  -Chronic with exacerbation, subsequent visit - Patient continues to have mild symptoms of hip flexor tightness and athletic pubalgia with pelvic dysfunction likely related to pregnancy - Continue to use Tylenol for day-to-day pain relief - Continue HEP - Patient has received relief with OMT in the past.  Elects for repeat OMT today.  Tolerated well per note below. - Decision today to treat with OMT was based on Physical Exam  After verbal consent patient was treated with HVLA (high velocity low amplitude), ME (muscle energy), FPR (flex positional release), ST (soft tissue), PC/PD (Pelvic Compression/ Pelvic Decompression) techniques in lower extremity, lumbar, and pelvic areas. Patient tolerated the procedure well with improvement in symptoms.  Patient educated on potential side effects of soreness and recommended to rest, hydrate, and use Tylenol as needed for pain control.   Pertinent previous records reviewed include none   Follow Up: 4 weeks for reevaluation.  Could consider repeat OMT   Subjective:   I, Lindsey Stevenson, am serving as a Neurosurgeon for Doctor Richardean Sale   Chief Complaint: right inguinal pain    HPI:    10/01/22 Patient is a 27 year old female complaining of right inguinal pain. Patient states that she has had the pain intermittently since 2018. OB hasn't found anything , ED told her it was maybe a sports hernia, she does states that she has increased activity. Does have two children that she lifts, she can't walk for ling periods of time where she will be doubled over  in pain.when the pain is really bad it radiates to the back but the pain is usually in the same area. No numbness and tingling. States that her right leg will get heavier. Feel as though her circulation is a little different when the pain is flared. Ibu and tylenol don't help. Did have a CT done    11/06/2022 Patient states that she has been okay    12/10/2022 Patient states that she is okay, just here for a tune up    01/10/2023 Patient states that she is pretty good , here for a tune up    Relevant Historical Information: Current pregnancy   Additional pertinent review of systems negative.   Current Outpatient Medications:    acetaminophen (TYLENOL) 500 MG tablet, Take 500 mg by mouth every 6 (six) hours as needed for moderate pain., Disp: , Rfl:    amoxicillin (AMOXIL) 875 MG tablet, Take 1 tablet (875 mg total) by mouth 2 (two) times daily., Disp: 20 tablet, Rfl: 0   cholecalciferol (VITAMIN D3) 25 MCG (1000 UNIT) tablet, Take 2,000 Units by mouth daily., Disp: , Rfl:    fluticasone (FLONASE) 50 MCG/ACT nasal spray, Place 1 spray into both nostrils 2 (two) times daily as needed for allergies or rhinitis., Disp: 9.9 mL, Rfl: 0   Prenatal Vit-Fe Fumarate-FA (PRENATAL MULTIVITAMIN) TABS tablet, Take 1 tablet by mouth daily at 12 noon., Disp: , Rfl:    Objective:     Vitals:   01/10/23 0936  BP: 118/78  Pulse: 86  SpO2: 98%  Weight: 185 lb (83.9 kg)  Height: 5\' 7"  (1.702 m)  Body mass index is 28.98 kg/m.    Physical Exam:    General: Well-appearing, cooperative, sitting comfortably in no acute distress.   OMT Physical Exam:   ASIS Compression Test: Positive Right Lower extremity: Tight hip adductor's and flexors, worse on left Lumbar: TTP paraspinal, L1-3 RRSL, L5 RL Pelvis: Right anterior innominate    Electronically signed by:  Lindsey Stevenson [facility] 9:48 AM 01/10/23

## 2023-01-10 ENCOUNTER — Ambulatory Visit: Payer: Medicaid Other | Admitting: Sports Medicine

## 2023-01-10 VITALS — BP 118/78 | HR 86 | Ht 67.0 in | Wt 185.0 lb

## 2023-01-10 DIAGNOSIS — Z3A22 22 weeks gestation of pregnancy: Secondary | ICD-10-CM

## 2023-01-10 DIAGNOSIS — S3981XD Other specified injuries of abdomen, subsequent encounter: Secondary | ICD-10-CM

## 2023-01-10 DIAGNOSIS — M9903 Segmental and somatic dysfunction of lumbar region: Secondary | ICD-10-CM

## 2023-01-10 DIAGNOSIS — M9906 Segmental and somatic dysfunction of lower extremity: Secondary | ICD-10-CM

## 2023-01-10 DIAGNOSIS — M9905 Segmental and somatic dysfunction of pelvic region: Secondary | ICD-10-CM

## 2023-01-10 DIAGNOSIS — M545 Low back pain, unspecified: Secondary | ICD-10-CM | POA: Diagnosis not present

## 2023-02-03 ENCOUNTER — Encounter (HOSPITAL_COMMUNITY): Payer: Self-pay | Admitting: Obstetrics and Gynecology

## 2023-02-03 ENCOUNTER — Inpatient Hospital Stay (HOSPITAL_COMMUNITY)
Admission: AD | Admit: 2023-02-03 | Discharge: 2023-02-03 | Disposition: A | Discharge status: Home / Self Care | Payer: Medicaid Other | Attending: Family Medicine | Admitting: Family Medicine

## 2023-02-03 DIAGNOSIS — O26892 Other specified pregnancy related conditions, second trimester: Secondary | ICD-10-CM | POA: Diagnosis not present

## 2023-02-03 DIAGNOSIS — R102 Pelvic and perineal pain: Secondary | ICD-10-CM | POA: Diagnosis not present

## 2023-02-03 DIAGNOSIS — R109 Unspecified abdominal pain: Secondary | ICD-10-CM | POA: Diagnosis not present

## 2023-02-03 DIAGNOSIS — O4702 False labor before 37 completed weeks of gestation, second trimester: Secondary | ICD-10-CM | POA: Insufficient documentation

## 2023-02-03 DIAGNOSIS — Z3A25 25 weeks gestation of pregnancy: Secondary | ICD-10-CM | POA: Diagnosis not present

## 2023-02-03 LAB — URINALYSIS, ROUTINE W REFLEX MICROSCOPIC
Bilirubin Urine: NEGATIVE
Glucose, UA: NEGATIVE mg/dL
Hgb urine dipstick: NEGATIVE
Ketones, ur: 20 mg/dL — AB
Leukocytes,Ua: NEGATIVE
Nitrite: NEGATIVE
Protein, ur: NEGATIVE mg/dL
Specific Gravity, Urine: 1.015 (ref 1.005–1.030)
pH: 7 (ref 5.0–8.0)

## 2023-02-03 NOTE — MAU Note (Signed)
.  Lindsey Stevenson is a 27 y.o. at [redacted]w[redacted]d here in MAU reporting: last night she started having more painful braxton hicks. Slept but woke up and still was feeling them not as stron or as often but still more than she thinks she should be feeling them. 3-5 times/hr.  Onset of complaint: last night. Denies any vag bleeding or leaking. Good fetal movement felt.  Pain score: 4-5 There were no vitals filed for this visit.   FHT:155 Lab orders placed from triage:

## 2023-02-03 NOTE — MAU Provider Note (Signed)
History     [id number]  Arrival date and time: 02/03/23 1308    Chief Complaint  Patient presents with   Contractions     HPI Lindsey Stevenson is a 27 y.o. at [redacted]w[redacted]d by 6 wk [place] with PMHx notable for two prior term NSVD, who presents for contractions.   Review of outside prenatal records from [facility] (in media tab): unremarkable course, normal labs, prenatal history notable for two term NSVD  Patient reports that since last night has had some intermittent contractions as well as pelvic cramping that has felt like how she was just prior to going into labor previously This made her worried so came to get checked out No vaginal bleeding or leaking fluid Fetal movement is normal No vaginal discharge No burning or pain with urination No diarrhea, no nausea/vomiting Toddler has had a cold lately and she has been feeling a little under the weather as well       OB History     Gravida  3   Para  2   Term  2   Preterm      AB      Living  2      SAB      IAB      Ectopic      Multiple  0   Live Births  2           Past Medical History:  Diagnosis Date   Anxiety    Depression    Migraine    SVT (supraventricular tachycardia) (HCC)    Vaginal Pap smear, abnormal 2019   HPV    Past Surgical History:  Procedure Laterality Date   NO PAST SURGERIES      Family History  Problem Relation Age of Onset   Cancer Maternal Grandmother    Arthritis Maternal Grandmother    Cancer Maternal Grandfather    Hypertension Paternal Grandmother    Anxiety disorder Mother    Hypertension Mother    Hypertension Father    Asthma Brother    Diabetes Brother    Early death Sister     Social History   Socioeconomic History   Marital status: Married    Spouse name: Jared   Number of children: 3   Years of education: Not on file   Highest education level: Not on file  Occupational History   Not on file  Tobacco Use   Smoking status: Never    Smokeless tobacco: Never  Vaping Use   Vaping status: Never Used  Substance and Sexual Activity   Alcohol use: Not Currently   Drug use: Never   Sexual activity: Yes    Birth control/protection: None  Other Topics Concern   Not on file  Social History Narrative   Right Handed    No Caffeine    Social Determinants of Health   Financial Resource Strain: Not on file  Food Insecurity: Not on file  Transportation Needs: Not on file  Physical Activity: Not on file  Stress: Not on file  Social Connections: Not on file  Intimate Partner Violence: Not on file    No Known Allergies  No current facility-administered medications on file prior to encounter.   Current Outpatient Medications on File Prior to Encounter  Medication Sig Dispense Refill   acetaminophen (TYLENOL) 500 MG tablet Take 500 mg by mouth every 6 (six) hours as needed for moderate pain.     Prenatal Vit-Fe Fumarate-FA (PRENATAL MULTIVITAMIN) TABS tablet  Take 1 tablet by mouth daily at 12 noon.     simethicone (MYLICON) 125 MG chewable tablet Chew 125 mg by mouth every 6 (six) hours as needed for flatulence. Takes 1-2 as needed     amoxicillin (AMOXIL) 875 MG tablet Take 1 tablet (875 mg total) by mouth 2 (two) times daily. 20 tablet 0   cholecalciferol (VITAMIN D3) 25 MCG (1000 UNIT) tablet Take 2,000 Units by mouth daily.     fluticasone (FLONASE) 50 MCG/ACT nasal spray Place 1 spray into both nostrils 2 (two) times daily as needed for allergies or rhinitis. 9.9 mL 0     ROS Pertinent positives and negative per HPI, all others reviewed and negative  Physical Exam   BP 109/65 (BP Location: Left Arm)   Pulse 97   Temp 98 F (36.7 C) (Oral)   Resp 18   Ht 5\' 7"  (1.702 m)   Wt 85.3 kg   LMP 08/06/2022   SpO2 100%   BMI 29.44 kg/m   Patient Vitals for the past 24 hrs:  BP Temp Temp src Pulse Resp SpO2 Height Weight  02/03/23 1438 109/65 98 F (36.7 C) Oral 97 18 100 % -- --  02/03/23 1337 116/68 -- -- 88  -- 100 % -- --  02/03/23 1322 122/68 98 F (36.7 C) -- 90 18 -- 5\' 7"  (1.702 m) 85.3 kg    Physical Exam Vitals reviewed.  Constitutional:      General: She is not in acute distress.    Appearance: She is well-developed. She is not diaphoretic.  Eyes:     General: No scleral icterus. Pulmonary:     Effort: Pulmonary effort is normal. No respiratory distress.  Abdominal:     General: There is no distension.     Palpations: Abdomen is soft.     Tenderness: There is no abdominal tenderness. There is no guarding or rebound.  Skin:    General: Skin is warm and dry.  Neurological:     Mental Status: She is alert.     Coordination: Coordination normal.      Cervical Exam Dilation: Closed Exam by:: Dr. Crissie Reese  Bedside Ultrasound Not performed.  My interpretation: n/a  FHT Baseline: 145 bpm Variability: Good {> 6 bpm) Accelerations: Reactive Decelerations: Absent Uterine activity: None Cat: I  Labs Results for orders placed or performed during the hospital encounter of 02/03/23 (from the past 24 hour(s))  Urinalysis, Routine w reflex microscopic -Urine, Clean Catch     Status: Abnormal   Collection Time: 02/03/23  1:45 PM  Result Value Ref Range   Color, Urine YELLOW YELLOW   APPearance CLEAR CLEAR   Specific Gravity, Urine 1.015 1.005 - 1.030   pH 7.0 5.0 - 8.0   Glucose, UA NEGATIVE NEGATIVE mg/dL   Hgb urine dipstick NEGATIVE NEGATIVE   Bilirubin Urine NEGATIVE NEGATIVE   Ketones, ur 20 (A) NEGATIVE mg/dL   Protein, ur NEGATIVE NEGATIVE mg/dL   Nitrite NEGATIVE NEGATIVE   Leukocytes,Ua NEGATIVE NEGATIVE    Imaging No results found.  MAU Course  Procedures Lab Orders         Urinalysis, Routine w reflex microscopic -Urine, Clean Catch    No orders of the defined types were placed in this encounter.  Imaging Orders  No imaging studies ordered today    MDM Moderate (Level 3-4)  Assessment and Plan  #Abdominal pain in pregnancy, second  trimester #[redacted] weeks gestation of pregnancy No contractions seen on monitor. Cervix  closed, long, and very posterior. Low suspicion for any acute pathology. No symptoms of infection outside of general viral malaise. Discussed likely due to combination of advancing gestational age and possible viral illness from her toddler. Routine precautions.   #FWB FHT Cat I NST: Reactive   Dispo: discharged to home in stable condition    Venora Maples, MD/MPH 02/03/23 2:41 PM  Allergies as of 02/03/2023   No Known Allergies      Medication List     STOP taking these medications    amoxicillin 875 MG tablet Commonly known as: AMOXIL       TAKE these medications    acetaminophen 500 MG tablet Commonly known as: TYLENOL Take 500 mg by mouth every 6 (six) hours as needed for moderate pain.   cholecalciferol 25 MCG (1000 UNIT) tablet Commonly known as: VITAMIN D3 Take 2,000 Units by mouth daily.   fluticasone 50 MCG/ACT nasal spray Commonly known as: FLONASE Place 1 spray into both nostrils 2 (two) times daily as needed for allergies or rhinitis.   prenatal multivitamin Tabs tablet Take 1 tablet by mouth daily at 12 noon.   simethicone 125 MG chewable tablet Commonly known as: MYLICON Chew 125 mg by mouth every 6 (six) hours as needed for flatulence. Takes 1-2 as needed

## 2023-02-06 NOTE — Progress Notes (Signed)
Lindsey Stevenson [facility] [address] [place] [address] Phone: [phone]   Assessment and Plan:     1. Acute bilateral low back pain without sciatica 2. [redacted] weeks gestation of pregnancy 3. Somatic dysfunction of lumbar region 4. Somatic dysfunction of pelvic region 5. Somatic dysfunction of sacral region 6.  Sports hernia, subsequent encounter -Chronic with exacerbation, subsequent visit - Overall improvement with frequent OMT, however patient continues to experience musculoskeletal complaints primarily in low back, hips related to pregnancy - May continue Tylenol for day-to-day pain relief - Continue HEP - Patient has received relief with OMT in the past.  Elects for repeat OMT today.  Tolerated well per note below. - Decision today to treat with OMT was based on Physical Exam  After verbal consent patient was treated with HVLA (high velocity low amplitude), ME (muscle energy), FPR (flex positional release), ST (soft tissue), PC/PD (Pelvic Compression/ Pelvic Decompression) techniques in sacrum, lumbar, and pelvic areas. Patient tolerated the procedure well with improvement in symptoms.  Patient educated on potential side effects of soreness and recommended to rest, hydrate, and use Tylenol as needed for pain control.    Follow Up: 4 weeks for reevaluation or sooner if needed.  Could consider repeat OMT   Subjective:   I, Lindsey Stevenson, am serving as a Neurosurgeon for Doctor Richardean Sale   Chief Complaint: right inguinal pain    HPI:    10/01/22 Patient is a 27 year old female complaining of right inguinal pain. Patient states that she has had the pain intermittently since 2018. OB hasn't found anything , ED told her it was maybe a sports hernia, she does states that she has increased activity. Does have two children that she lifts, she can't walk for ling periods of time where she will be doubled over in pain.when the pain is really  bad it radiates to the back but the pain is usually in the same area. No numbness and tingling. States that her right leg will get heavier. Feel as though her circulation is a little different when the pain is flared. Ibu and tylenol don't help. Did have a CT done    11/06/2022 Patient states that she has been okay    12/10/2022 Patient states that she is okay, just here for a tune up    01/10/2023 Patient states that she is pretty good , here for a tune up   02/07/2023 Patient states she is okay has new pain spots  Relevant Historical Information: Current pregnancy    Additional pertinent review of systems negative.   Current Outpatient Medications:    acetaminophen (TYLENOL) 500 MG tablet, Take 500 mg by mouth every 6 (six) hours as needed for moderate pain., Disp: , Rfl:    cholecalciferol (VITAMIN D3) 25 MCG (1000 UNIT) tablet, Take 2,000 Units by mouth daily., Disp: , Rfl:    fluticasone (FLONASE) 50 MCG/ACT nasal spray, Place 1 spray into both nostrils 2 (two) times daily as needed for allergies or rhinitis., Disp: 9.9 mL, Rfl: 0   Prenatal Vit-Fe Fumarate-FA (PRENATAL MULTIVITAMIN) TABS tablet, Take 1 tablet by mouth daily at 12 noon., Disp: , Rfl:    simethicone (MYLICON) 125 MG chewable tablet, Chew 125 mg by mouth every 6 (six) hours as needed for flatulence. Takes 1-2 as needed, Disp: , Rfl:    Objective:     Vitals:   02/07/23 0940  BP: 110/80  Pulse: (!) 111  SpO2: 99%  Weight: 187 lb (84.8 kg)  Height: 5\' 7"  (1.702 m)      Body mass index is 29.29 kg/m.    Physical Exam:    General: Well-appearing, cooperative, sitting comfortably in no acute distress.   OMT Physical Exam:  ASIS Compression Test: Positive Right Sacrum: Positive sphinx, TTP bilateral sacral base Lumbar: TTP paraspinal, L1-3 RRSL, L5 RL Pelvis: Right anterior innominate    Electronically signed by:  Lindsey Stevenson [facility] 9:58 AM 02/07/23

## 2023-02-07 ENCOUNTER — Ambulatory Visit (INDEPENDENT_AMBULATORY_CARE_PROVIDER_SITE_OTHER): Payer: Medicaid Other | Admitting: Sports Medicine

## 2023-02-07 VITALS — BP 110/80 | HR 111 | Ht 67.0 in | Wt 187.0 lb

## 2023-02-07 DIAGNOSIS — M9903 Segmental and somatic dysfunction of lumbar region: Secondary | ICD-10-CM | POA: Diagnosis not present

## 2023-02-07 DIAGNOSIS — M9904 Segmental and somatic dysfunction of sacral region: Secondary | ICD-10-CM

## 2023-02-07 DIAGNOSIS — S3981XD Other specified injuries of abdomen, subsequent encounter: Secondary | ICD-10-CM

## 2023-02-07 DIAGNOSIS — Z3A26 26 weeks gestation of pregnancy: Secondary | ICD-10-CM

## 2023-02-07 DIAGNOSIS — M545 Low back pain, unspecified: Secondary | ICD-10-CM | POA: Diagnosis not present

## 2023-02-07 DIAGNOSIS — M9905 Segmental and somatic dysfunction of pelvic region: Secondary | ICD-10-CM

## 2023-02-11 ENCOUNTER — Encounter (HOSPITAL_COMMUNITY): Payer: Self-pay | Admitting: Emergency Medicine

## 2023-02-11 ENCOUNTER — Ambulatory Visit (HOSPITAL_COMMUNITY)
Admission: EM | Admit: 2023-02-11 | Discharge: 2023-02-11 | Disposition: A | Discharge status: Home / Self Care | Payer: Medicaid Other | Attending: Emergency Medicine | Admitting: Emergency Medicine

## 2023-02-11 DIAGNOSIS — B349 Viral infection, unspecified: Secondary | ICD-10-CM | POA: Diagnosis not present

## 2023-02-11 DIAGNOSIS — H9201 Otalgia, right ear: Secondary | ICD-10-CM

## 2023-02-11 DIAGNOSIS — R0989 Other specified symptoms and signs involving the circulatory and respiratory systems: Secondary | ICD-10-CM | POA: Diagnosis not present

## 2023-02-11 MED ORDER — FLUTICASONE PROPIONATE 50 MCG/ACT NA SUSP: 2.0000 | Freq: Every day | NASAL | 2 refills | Status: DC

## 2023-02-11 MED ORDER — CETIRIZINE HCL 10 MG PO TABS: 10.0000 mg | ORAL_TABLET | Freq: Every day | ORAL | 2 refills | Status: DC

## 2023-02-11 MED ORDER — NEOMYCIN-POLYMYXIN-HC 3.5-10000-1 OT SUSP: 3.0000 [drp] | Freq: Two times a day (BID) | OTIC | 0 refills | Status: AC

## 2023-02-11 NOTE — ED Triage Notes (Addendum)
Pt states he had a cold about a week ago and started getting better but  yesterday she started to have bilateral ear pain/fullness, neck pain, headache, head pressure, and dizziness.   She took tylenol around 3pm today that help symptoms some.

## 2023-02-11 NOTE — ED Provider Notes (Signed)
[facility]    CSN: [id number] Arrival date & time: 02/11/23  1935      History   Chief Complaint Chief Complaint  Patient presents with   Otalgia   Headache    HPI Lindsey Stevenson is a 27 y.o. female.  Right ear pain for a few days. Fullness sensation  She's had runny nose, drainage, and headache for about a week before this started No fevers Took tylenol which helps temporarily  Son has been sick with similar  Currently pregnant (26 weeks), reports hx of ear infection during preg No abd pain, NVD, bleeding Next OB appointment is in 3 days  Past Medical History:  Diagnosis Date   Anxiety    Depression    Migraine    SVT (supraventricular tachycardia) (HCC)    Vaginal Pap smear, abnormal 2019   HPV    Patient Active Problem List   Diagnosis Date Noted   PSVT (paroxysmal supraventricular tachycardia) (HCC) 11/05/2021   Elevated blood pressure reading 11/05/2021   Premature rupture of membranes 09/24/2021   SVT (supraventricular tachycardia) (HCC) 05/13/2021   Health education/counseling 05/13/2021   Indication for care in labor or delivery 08/07/2019    Past Surgical History:  Procedure Laterality Date   NO PAST SURGERIES      OB History     Gravida  3   Para  2   Term  2   Preterm      AB      Living  2      SAB      IAB      Ectopic      Multiple  0   Live Births  2            Home Medications    Prior to Admission medications   Medication Sig Start Date End Date Taking? Authorizing Provider  cetirizine (ZYRTEC ALLERGY) 10 MG tablet Take 1 tablet (10 mg total) by mouth daily. 02/11/23  Yes Ahmiya Abee, PA-C  fluticasone (FLONASE) 50 MCG/ACT nasal spray Place 2 sprays into both nostrils daily. 02/11/23  Yes Anshu Wehner, Lurena Joiner, PA-C  neomycin-polymyxin-hydrocortisone (CORTISPORIN) 3.5-10000-1 OTIC suspension Place 3 drops into the right ear 2 (two) times daily for 5 days. 02/11/23 02/16/23 Yes Espn Zeman, Lurena Joiner,  PA-C  acetaminophen (TYLENOL) 500 MG tablet Take 500 mg by mouth every 6 (six) hours as needed for moderate pain.    [provider]  cholecalciferol (VITAMIN D3) 25 MCG (1000 UNIT) tablet Take 2,000 Units by mouth daily.    [provider]  Prenatal Vit-Fe Fumarate-FA (PRENATAL MULTIVITAMIN) TABS tablet Take 1 tablet by mouth daily at 12 noon.    [provider]  simethicone (MYLICON) 125 MG chewable tablet Chew 125 mg by mouth every 6 (six) hours as needed for flatulence. Takes 1-2 as needed    [provider]    Family History Family History  Problem Relation Age of Onset   Cancer Maternal Grandmother    Arthritis Maternal Grandmother    Cancer Maternal Grandfather    Hypertension Paternal Grandmother    Anxiety disorder Mother    Hypertension Mother    Hypertension Father    Asthma Brother    Diabetes Brother    Early death Sister     Social History Social History   Tobacco Use   Smoking status: Never   Smokeless tobacco: Never  Vaping Use   Vaping status: Never Used  Substance Use Topics   Alcohol use: Not Currently  Drug use: Never     Allergies   Patient has no known allergies.   Review of Systems Review of Systems Per HPI  Physical Exam Triage Vital Signs ED Triage Vitals  Encounter Vitals Group     BP 02/11/23 1958 112/76     Systolic BP Percentile --      Diastolic BP Percentile --      Pulse Rate 02/11/23 1958 90     Resp 02/11/23 1958 16     Temp 02/11/23 1958 98.9 F (37.2 C)     Temp Source 02/11/23 1958 Oral     SpO2 02/11/23 1958 97 %     Weight --      Height --      Head Circumference --      Peak Flow --      Pain Score 02/11/23 2001 3     Pain Loc --      Pain Education --      Exclude from Growth Chart --    No data found.  Updated Vital Signs BP 112/76 (BP Location: Right Arm)   Pulse 90   Temp 98.9 F (37.2 C) (Oral)   Resp 16   LMP 08/06/2022   SpO2 97%   Physical Exam Vitals and  nursing note reviewed.  Constitutional:      Appearance: She is not ill-appearing.  HENT:     Right Ear: Tympanic membrane and ear canal normal. Tenderness (tragal, canal) present.     Left Ear: Tympanic membrane and ear canal normal.     Ears:     Comments: Canals and TMs are both unremarkable. However patient has tragal discomfort and pain with exam of the right canal    Nose: No congestion or rhinorrhea.     Mouth/Throat:     Mouth: Mucous membranes are moist.     Pharynx: Oropharynx is clear. No posterior oropharyngeal erythema.     Comments: Mild clear drainage  Eyes:     Conjunctiva/sclera: Conjunctivae normal.  Cardiovascular:     Rate and Rhythm: Normal rate and regular rhythm.     Pulses: Normal pulses.     Heart sounds: Normal heart sounds.  Pulmonary:     Effort: Pulmonary effort is normal.     Breath sounds: Normal breath sounds.  Abdominal:     Comments: gravid  Musculoskeletal:     Cervical back: Normal range of motion.  Lymphadenopathy:     Cervical: No cervical adenopathy.  Skin:    General: Skin is warm and dry.  Neurological:     Mental Status: She is alert and oriented to person, place, and time.      UC Treatments / Results  Labs (all labs ordered are listed, but only abnormal results are displayed) Labs Reviewed - No data to display  EKG  Radiology No results found.  Procedures Procedures   Medications Ordered in UC Medications - No data to display  Initial Impression / Assessment and Plan / UC Course  I have reviewed the triage vital signs and the nursing notes.  Pertinent labs & imaging results that were available during my care of the patient were reviewed by me and considered in my medical decision making (see chart for details).  Well appearing. Afebrile  Recommend daily allergy med and nasal spray, discussed both are safe in pregnancy No signs of otitis media. She does have canal and tragal tenderness on the right, can try  cortisporin drops BID for a few  days to treat possible start of otitis externa. She has OB follow up in 3 days, recommend discussing symptoms at that time and monitoring for improvement. Provided with list of meds safe in pregnancy. Discussed return, MAU, and ED precautions. Patient is agreeable to plan  Final Clinical Impressions(s) / UC Diagnoses   Final diagnoses:  Right ear pain  Runny nose  Viral illness     Discharge Instructions      Try zyrtec and flonase daily to help with ear pressure/fullness, runny nose, drainage.  Try the ear drops in the right ear twice daily for about 5 days  Please follow up at your appointment Friday!  You can try the ear drops in the right ear, twice daily, for about 5 days    ED Prescriptions     Medication Sig Dispense Auth. Provider   fluticasone (FLONASE) 50 MCG/ACT nasal spray Place 2 sprays into both nostrils daily. 9.9 mL Donzel Romack, PA-C   cetirizine (ZYRTEC ALLERGY) 10 MG tablet Take 1 tablet (10 mg total) by mouth daily. 30 tablet Parris Cudworth, PA-C   neomycin-polymyxin-hydrocortisone (CORTISPORIN) 3.5-10000-1 OTIC suspension Place 3 drops into the right ear 2 (two) times daily for 5 days. 10 mL Denelda Akerley, Lurena Joiner, PA-C      PDMP not reviewed this encounter.   Stepanie Graver, Ray Church 02/11/23 2053

## 2023-02-11 NOTE — Discharge Instructions (Addendum)
Try zyrtec and flonase daily to help with ear pressure/fullness, runny nose, drainage.  Try the ear drops in the right ear twice daily for about 5 days  Please follow up at your appointment Friday!  You can try the ear drops in the right ear, twice daily, for about 5 days

## 2023-02-17 ENCOUNTER — Ambulatory Visit: Payer: Medicaid Other | Admitting: Internal Medicine

## 2023-02-20 ENCOUNTER — Ambulatory Visit: Payer: Medicaid Other | Attending: Internal Medicine | Admitting: Internal Medicine

## 2023-02-20 NOTE — Progress Notes (Deleted)
Cardiology Office Note:    Date:  02/20/2023   ID:  Lelon Mast, DOB 1995-10-20, MRN [id number]  PCP:  Soundra Pilon, FNP   [facility] Providers Cardiologist:  Maisie Fus, MD     Referring MD: Soundra Pilon, FNP   No chief complaint on file. Palpitation  History of Present Illness:    Lindsey Stevenson is a 27 y.o. female with a hx of  G1P2 [redacted] weeks pregnant, uncomplicated delivery, anxiety, referral for palpitations  She states that she went to urgent care and was at work. She was dizzy and felt she would pass out. She found out later that she was pregnant. No syncope. She notes heart racing. She was exercising and her rates went up to 200 beats. She was having anxiety and panic attacks. She notes palpitations with COVID.  She notes recurrence with stress.  Her symptoms are not daily. She has a stressful job. No heart disease history. PGM has atrial fibrillation. No coronary dx family hx. No 1st degree with SCD. She stopped caffeine. No alcohol use. She is only taking the prenatal vitamin. No smoking.Her first pregnancy went well, no issues. 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  TSH 0.74 Crt 0.7  She returns after an ED visit 03/15/2021.  Patient states that this morning she had sudden onset palpitations with associated chest tightness and shortness of breath.  She states that her apple watch told her that her heart rate was approximately 210 bpm.  EMS was called who apparently also confirmed this heart rate.  EMS performed vagal maneuvers with the patient, blowing on a syringe, and the patient self aborted this rhythm. 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 She was recommended to treat with vagal maneuvers while pregnant. Considered AVNRT ablation after delivery. She is 15 weeks.   She has been doing well since she left the ED. She went back to school and has had some stress. She's having headaches. She had a mild episode yesterday of palpitations. She  has not had a syncopal event. It lasted for 20 minutes. She had an echocardiogram that was normal.   Interim hx 10/24/2022 She saw Dr. Ladona Ridgel Jan 2023, she was started on toprol, she was in her second trimester. ; per Dr. Ladona Ridgel plan was for her to stop her toprol 2 days prior to her scheduled delivery. They also discussed considering an EP study and ablation after delivery. She was seen in the [facility] x2. She was seen after delivery 11/02/2021. She was breastfeeding.  She comes in today, she is [redacted] weeks pregnant. She has had some palpitations.  She feels SOB as well. She stopped taking BB , wants to avoid medication  Past Medical History:  Diagnosis Date   Anxiety    Depression    Migraine    SVT (supraventricular tachycardia) (HCC)    Vaginal Pap smear, abnormal 2019   HPV    Past Surgical History:  Procedure Laterality Date   NO PAST SURGERIES      Current Medications: Current Outpatient Medications on File Prior to Visit  Medication Sig Dispense Refill   acetaminophen (TYLENOL) 500 MG tablet Take 500 mg by mouth every 6 (six) hours as needed for moderate pain.     cetirizine (ZYRTEC ALLERGY) 10 MG tablet Take 1 tablet (10 mg total) by mouth daily. 30 tablet 2   cholecalciferol (VITAMIN D3) 25 MCG (1000 UNIT) tablet Take 2,000 Units by mouth daily.  fluticasone (FLONASE) 50 MCG/ACT nasal spray Place 2 sprays into both nostrils daily. 9.9 mL 2   Prenatal Vit-Fe Fumarate-FA (PRENATAL MULTIVITAMIN) TABS tablet Take 1 tablet by mouth daily at 12 noon.     simethicone (MYLICON) 125 MG chewable tablet Chew 125 mg by mouth every 6 (six) hours as needed for flatulence. Takes 1-2 as needed     No current facility-administered medications on file prior to visit.    Allergies:   Patient has no known allergies.   Social History   Socioeconomic History   Marital status: Married    Spouse name: Jared   Number of children: 3   Years of education: Not on file   Highest education  level: Not on file  Occupational History   Not on file  Tobacco Use   Smoking status: Never   Smokeless tobacco: Never  Vaping Use   Vaping status: Never Used  Substance and Sexual Activity   Alcohol use: Not Currently   Drug use: Never   Sexual activity: Yes    Birth control/protection: None  Other Topics Concern   Not on file  Social History Narrative   Right Handed    No Caffeine    Social Determinants of Health   Financial Resource Strain: Not on file  Food Insecurity: Not on file  Transportation Needs: Not on file  Physical Activity: Not on file  Stress: Not on file  Social Connections: Not on file     Family History: The patient's family history includes Anxiety disorder in her mother; Arthritis in her maternal grandmother; Asthma in her brother; Cancer in her maternal grandfather and maternal grandmother; Diabetes in her brother; Early death in her sister; Hypertension in her father, mother, and paternal grandmother.  ROS:   Please see the history of present illness.     All other systems reviewed and are negative.  EKGs/Labs/Other Studies Reviewed:    The following studies were reviewed today:   EKG:  EKG is  ordered today.  The ekg ordered today demonstrates   NSR, no ischemic changes  NSR, PR 172 ms, Qtc 427 ms  EKG Interpretation Date/Time:    Ventricular Rate:    PR Interval:    QRS Duration:    QT Interval:    QTC Calculation:   R Axis:      Text Interpretation:     Cardiology Studies 03/15/2021:TTE- EF 60-65%. No valve disease, no pulmonary hypertension  Recent Labs: 09/01/2022: ALT 15; BUN 10; Creatinine, Ser 0.78; Hemoglobin 13.6; Platelets 264; Potassium 4.0; Sodium 137   Recent Lipid Panel No results found for: "CHOL", "TRIG", "HDL", "CHOLHDL", "VLDL", "LDLCALC", "LDLDIRECT"   Risk Assessment/Calculations:           Physical Exam:    VS:   There were no vitals filed for this visit.   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 Due to their infrequent nature and no high risk features we planned to watch it. She presented in December 2023 with recurrent symptoms to the ED.  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 She was on toprol at one point after I saw her. This has since been stopped. She is now pregnant again and wants to avoid medications. Will continue to monitor and she can consider EP study and ablation after delivery.  PLAN:    In order of problems listed above:   Follow up in 6 months      Medication Adjustments/Labs and Tests Ordered: Current medicines are reviewed at length with the patient today.  Concerns regarding medicines are outlined above.    Signed, Maisie Fus, MD  02/20/2023 8:19 AM    [facility]

## 2023-03-06 NOTE — Progress Notes (Signed)
Lindsey Stevenson D.[facility] [address] [place] [address] Phone: [phone]   Assessment and Plan:     1. Sports hernia, subsequent encounter 2. Chronic bilateral low back pain without sciatica 3. [redacted] weeks gestation of pregnancy 4. Somatic dysfunction of lumbar region 5. Somatic dysfunction of pelvic region 6. Somatic dysfunction of sacral region - Chronic with exacerbation, subsequent visit - Overall improvement in pelvic pain with OMT.  Patient continues to have musculoskeletal pains related to pregnancy, primarily right-sided pelvis and low back at today's visit. - Patient has received relief with OMT in the past.  Elects for repeat OMT today.  Tolerated well per note below. - Decision today to treat with OMT was based on Physical Exam   After verbal consent patient was treated with HVLA (high velocity low amplitude), ME (muscle energy), FPR (flex positional release), ST (soft tissue), PC/PD (Pelvic Compression/ Pelvic Decompression) techniques in sacrum lumbar, and pelvic areas. Patient tolerated the procedure well with improvement in symptoms.  Patient educated on potential side effects of soreness and recommended to rest, hydrate, and use Tylenol as needed for pain control.   Pertinent previous records reviewed include none  Follow Up: 2 weeks for reevaluation.  Could consider repeat OT   Subjective:   I, Lindsey Stevenson, am serving as a Neurosurgeon for Doctor Lindsey Stevenson   Chief Complaint: right inguinal pain    HPI:    10/01/22 Patient is a 27 year old female complaining of right inguinal pain. Patient states that she has had the pain intermittently since 2018. OB hasn't found anything , ED told her it was maybe a sports hernia, she does states that she has increased activity. Does have two children that she lifts, she can't walk for ling periods of time where she will be doubled over in pain.when the pain is really bad it radiates to the back  but the pain is usually in the same area. No numbness and tingling. States that her right leg will get heavier. Feel as though her circulation is a little different when the pain is flared. Ibu and tylenol don't help. Did have a CT done    11/06/2022 Patient states that she has been okay    12/10/2022 Patient states that she is okay, just here for a tune up    01/10/2023 Patient states that she is pretty good , here for a tune up    02/07/2023 Patient states she is okay has new pain spots  03/07/2023 Patient states she is good . Baby is pushing on the right side   Relevant Historical Information: Current pregnancy  Additional pertinent review of systems negative.  Current Outpatient Medications  Medication Sig Dispense Refill   acetaminophen (TYLENOL) 500 MG tablet Take 500 mg by mouth every 6 (six) hours as needed for moderate pain.     cetirizine (ZYRTEC ALLERGY) 10 MG tablet Take 1 tablet (10 mg total) by mouth daily. 30 tablet 2   cholecalciferol (VITAMIN D3) 25 MCG (1000 UNIT) tablet Take 2,000 Units by mouth daily.     fluticasone (FLONASE) 50 MCG/ACT nasal spray Place 2 sprays into both nostrils daily. 9.9 mL 2   Prenatal Vit-Fe Fumarate-FA (PRENATAL MULTIVITAMIN) TABS tablet Take 1 tablet by mouth daily at 12 noon.     simethicone (MYLICON) 125 MG chewable tablet Chew 125 mg by mouth every 6 (six) hours as needed for flatulence. Takes 1-2 as needed     No current facility-administered medications  for this visit.      Objective:     Vitals:   03/07/23 0943  BP: 118/78  Pulse: 99  SpO2: 100%  Weight: 187 lb (84.8 kg)  Height: 5\' 7"  (1.702 m)      Body mass index is 29.29 kg/m.    Physical Exam:     General: Well-appearing, cooperative, sitting comfortably in no acute distress.   OMT Physical Exam:  ASIS Compression Test: Positive Right Sacrum: Positive sphinx, TTP right sacral base Lumbar: TTP paraspinal, L2 RLSL Pelvis: Right anterior  innominate  Electronically signed by:  Lindsey Stevenson D.[facility] 9:54 AM 03/07/23

## 2023-03-07 ENCOUNTER — Ambulatory Visit (INDEPENDENT_AMBULATORY_CARE_PROVIDER_SITE_OTHER): Payer: Medicaid Other | Admitting: Sports Medicine

## 2023-03-07 VITALS — BP 118/78 | HR 99 | Ht 67.0 in | Wt 187.0 lb

## 2023-03-07 DIAGNOSIS — S3981XD Other specified injuries of abdomen, subsequent encounter: Secondary | ICD-10-CM

## 2023-03-07 DIAGNOSIS — M9903 Segmental and somatic dysfunction of lumbar region: Secondary | ICD-10-CM

## 2023-03-07 DIAGNOSIS — M9905 Segmental and somatic dysfunction of pelvic region: Secondary | ICD-10-CM

## 2023-03-07 DIAGNOSIS — M545 Low back pain, unspecified: Secondary | ICD-10-CM

## 2023-03-07 DIAGNOSIS — Z3A30 30 weeks gestation of pregnancy: Secondary | ICD-10-CM

## 2023-03-07 DIAGNOSIS — G8929 Other chronic pain: Secondary | ICD-10-CM

## 2023-03-07 DIAGNOSIS — M9904 Segmental and somatic dysfunction of sacral region: Secondary | ICD-10-CM

## 2023-03-07 NOTE — Patient Instructions (Signed)
2 week follow up

## 2023-03-20 NOTE — Progress Notes (Signed)
Lindsey Stevenson D.[facility] [address] [place] [address] Phone: [phone]   Assessment and Plan:     1. Chronic bilateral low back pain without sciatica 2. [redacted] weeks gestation of pregnancy 3. Somatic dysfunction of lumbar region 4. Somatic dysfunction of pelvic region 5. Somatic dysfunction of sacral region  -Chronic with exacerbation, subsequent visit - Continued low back pain, flared due to pregnancy.  OMT has been beneficial - Continue Tylenol as needed for pain relief - Patient has received relief with OMT in the past.  Elects for repeat OMT today.  Tolerated well per note below. - Decision today to treat with OMT was based on Physical Exam   After verbal consent patient was treated with HVLA (high velocity low amplitude), ME (muscle energy), FPR (flex positional release), ST (soft tissue), PC/PD (Pelvic Compression/ Pelvic Decompression) techniques in sacrum, lumbar, and pelvic areas. Patient tolerated the procedure well with improvement in symptoms.  Patient educated on potential side effects of soreness and recommended to rest, hydrate, and use Tylenol as needed for pain control.   Pertinent previous records reviewed include none  Follow Up: 2 to 3 weeks for reevaluation.  Could consider repeat OMT   Subjective:   I, Lindsey Stevenson, am serving as a Neurosurgeon for Doctor Richardean Sale   Chief Complaint: right inguinal pain    HPI:    10/01/22 Patient is a 27 year old female complaining of right inguinal pain. Patient states that she has had the pain intermittently since 2018. OB hasn't found anything , ED told her it was maybe a sports hernia, she does states that she has increased activity. 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 No numbness and tingling. States that her  right leg will get heavier. Feel as though her circulation is a little different when the pain is flared. Ibu and tylenol don't help. Did have a CT done    11/06/2022 Patient states that she has been okay    12/10/2022 Patient states that she is okay, just here for a tune up    01/10/2023 Patient states that she is pretty good , here for a tune up    02/07/2023 Patient states she is okay has new pain spots   03/07/2023 Patient states she is good . Baby is pushing on the right side   03/21/2023 Patient states  she had a sharp side stitch. That's where the  baby kicked    Relevant Historical Information: Current pregnancy  Additional pertinent review of systems negative.  Current Outpatient Medications  Medication Sig Dispense Refill   acetaminophen (TYLENOL) 500 MG tablet Take 500 mg by mouth every 6 (six) hours as needed for moderate pain.     cetirizine (ZYRTEC ALLERGY) 10 MG tablet Take 1 tablet (10 mg total) by mouth daily. 30 tablet 2   cholecalciferol (VITAMIN D3) 25 MCG (1000 UNIT) tablet Take 2,000 Units by mouth daily.     fluticasone (FLONASE) 50 MCG/ACT nasal spray Place 2 sprays into both nostrils daily. 9.9 mL 2   Prenatal Vit-Fe Fumarate-FA (PRENATAL MULTIVITAMIN) TABS tablet Take 1 tablet by mouth daily at 12 noon.     simethicone (MYLICON) 125 MG chewable tablet Chew 125 mg by mouth every 6 (six) hours as needed for flatulence.  Takes 1-2 as needed     No current facility-administered medications for this visit.      Objective:     Vitals:   03/21/23 0944  Pulse: (!) 108  SpO2: 98%  Weight: 187 lb (84.8 kg)  Height: 5\' 7"  (1.702 m)      Body mass index is 29.29 kg/m.    Physical Exam:     General: Well-appearing, cooperative, sitting comfortably in no acute distress.   OMT Physical Exam:  ASIS Compression Test: Positive Right Sacrum: Positive sphinx, TTP bilateral sacral base Lumbar: TTP paraspinal, L1-3 RRSL, L5 RL Pelvis: Right anterior  innominate  Electronically signed by:  Lindsey Stevenson D.[facility] 9:59 AM 03/21/23

## 2023-03-21 ENCOUNTER — Ambulatory Visit (INDEPENDENT_AMBULATORY_CARE_PROVIDER_SITE_OTHER): Payer: Medicaid Other | Admitting: Sports Medicine

## 2023-03-21 VITALS — HR 108 | Ht 67.0 in | Wt 187.0 lb

## 2023-03-21 DIAGNOSIS — M9903 Segmental and somatic dysfunction of lumbar region: Secondary | ICD-10-CM | POA: Diagnosis not present

## 2023-03-21 DIAGNOSIS — M9905 Segmental and somatic dysfunction of pelvic region: Secondary | ICD-10-CM

## 2023-03-21 DIAGNOSIS — G8929 Other chronic pain: Secondary | ICD-10-CM

## 2023-03-21 DIAGNOSIS — Z3A32 32 weeks gestation of pregnancy: Secondary | ICD-10-CM

## 2023-03-21 DIAGNOSIS — M9904 Segmental and somatic dysfunction of sacral region: Secondary | ICD-10-CM

## 2023-03-21 DIAGNOSIS — M545 Low back pain, unspecified: Secondary | ICD-10-CM | POA: Diagnosis not present

## 2023-03-31 NOTE — Progress Notes (Signed)
 Ben Shir Bergman D.[facility] [address] [place] [address] Phone: [phone]   Assessment and Plan:     1. Chronic bilateral low back pain without sciatica 2. [redacted] weeks gestation of pregnancy 3. Somatic dysfunction of lumbar region 4. Somatic dysfunction of pelvic region 5. Somatic dysfunction of sacral region - Chronic with exacerbation, subsequent visit - Continued low back, pelvic pain flared due to pregnancy.  OMT has been beneficial - Continue Tylenol  for day-to-day pain relief - Patient has received relief with OMT in the past.  Elects for repeat OMT today.  Tolerated well per note below. - Decision today to treat with OMT was based on Physical Exam   After verbal consent patient was treated with HVLA (high velocity low amplitude), ME (muscle energy), FPR (flex positional release), ST (soft tissue), PC/PD (Pelvic Compression/ Pelvic Decompression) techniques in sacrum, lumbar, and pelvic areas. Patient tolerated the procedure well with improvement in symptoms.  Patient educated on potential side effects of soreness and recommended to rest, hydrate, and use Tylenol  as needed for pain control.   Pertinent previous records reviewed include none  Follow Up: 1 to 2 weeks for reevaluation.  Could consider repeat OMT   Subjective:   I, Lindsey Stevenson, am serving as a neurosurgeon for Doctor Morene Mace   Chief Complaint: right inguinal pain    HPI:    10/01/22 Patient is a 27 year old female complaining of right inguinal pain. Patient states that she has had the pain intermittently since 2018. OB hasn't found anything , ED told her it was maybe a sports hernia, she does states that she has increased activity. 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 No numbness and tingling. States that  her right leg will get heavier. Feel as though her circulation is a little different when the pain is flared. Ibu and tylenol  don't help. Did have a CT done    11/06/2022 Patient states that she has been okay    12/10/2022 Patient states that she is okay, just here for a tune up    01/10/2023 Patient states that she is pretty good , here for a tune up    02/07/2023 Patient states she is okay has new pain spots   03/07/2023 Patient states she is good . Baby is pushing on the right side    03/21/2023 Patient states  she had a sharp side stitch. That's where the  baby kicked   04/04/2023 Patient states that she can feel her pelvis is shifting, she can feel it when she takes wider steps. Isnt able to sleep on side causes [ain    Relevant Historical Information: Current pregnancy  Additional pertinent review of systems negative.  Current Outpatient Medications  Medication Sig Dispense Refill   acetaminophen  (TYLENOL ) 500 MG tablet Take 500 mg by mouth every 6 (six) hours as needed for moderate pain.     cetirizine  (ZYRTEC  ALLERGY) 10 MG tablet Take 1 tablet (10 mg total) by mouth daily. 30 tablet 2   cholecalciferol (VITAMIN D3) 25 MCG (1000 UNIT) tablet Take 2,000 Units by mouth daily.     fluticasone  (FLONASE ) 50 MCG/ACT nasal spray Place 2 sprays into both nostrils daily. 9.9 mL 2   Prenatal Vit-Fe Fumarate-FA (PRENATAL MULTIVITAMIN) TABS tablet Take  1 tablet by mouth daily at 12 noon.     simethicone  (MYLICON) 125 MG chewable tablet Chew 125 mg by mouth every 6 (six) hours as needed for flatulence. Takes 1-2 as needed     No current facility-administered medications for this visit.      Objective:     Vitals:   04/04/23 1128  Pulse: 95  SpO2: 100%  Weight: 187 lb (84.8 kg)  Height: 5' 7 (1.702 m)      Body mass index is 29.29 kg/m.    Physical Exam:     General: Well-appearing, cooperative, sitting comfortably in no acute distress.   OMT Physical Exam:   ASIS  Compression Test: Positive Right Sacrum: Positive sphinx, TTP bilateral sacral base Lumbar: TTP paraspinal, L1-3 RRSL, L5 RL Pelvis: Right anterior innominate   Electronically signed by:  Odis Mace D.[facility] 11:40 AM 04/04/23

## 2023-04-02 NOTE — L&D Delivery Note (Signed)
 Delivery Note At 6:43 PM a viable female was delivered via Vaginal, Spontaneous (Presentation:  occiput anterior ). No nuchal cord. The anterior shoulder and body were delivered without difficulty. After one minute the umbilical cord was clamped x 2 and cut.  APGAR: 8 at one minute and 9 at five minutes.  weight  pending. .   Placenta status:Intact  ,  .  Cord: 3 vessels with the following complications: None.  Cord pH: NA  Anesthesia: Epidural Episiotomy: None Lacerations: None Suture Repair:  NA Est. Blood Loss (mL): 75cc    Mom to postpartum.  Baby to Couplet care / Skin to Skin.  Gerald Leitz 05/20/2023, 6:53 PM

## 2023-04-04 ENCOUNTER — Ambulatory Visit (INDEPENDENT_AMBULATORY_CARE_PROVIDER_SITE_OTHER): Payer: Medicaid Other | Admitting: Sports Medicine

## 2023-04-04 VITALS — HR 95 | Ht 67.0 in | Wt 187.0 lb

## 2023-04-04 DIAGNOSIS — Z3A34 34 weeks gestation of pregnancy: Secondary | ICD-10-CM

## 2023-04-04 DIAGNOSIS — M9903 Segmental and somatic dysfunction of lumbar region: Secondary | ICD-10-CM

## 2023-04-04 DIAGNOSIS — M9905 Segmental and somatic dysfunction of pelvic region: Secondary | ICD-10-CM

## 2023-04-04 DIAGNOSIS — M9904 Segmental and somatic dysfunction of sacral region: Secondary | ICD-10-CM

## 2023-04-04 DIAGNOSIS — G8929 Other chronic pain: Secondary | ICD-10-CM

## 2023-04-04 DIAGNOSIS — M545 Low back pain, unspecified: Secondary | ICD-10-CM | POA: Diagnosis not present

## 2023-04-17 LAB — OB RESULTS CONSOLE GBS: GBS: NEGATIVE

## 2023-04-17 NOTE — Progress Notes (Deleted)
Aleen Sells D.[facility] [address] [place] [address] Phone: [phone]   Assessment and Plan:     There are no diagnoses linked to this encounter.  *** - Patient has received relief with OMT in the past.  Elects for repeat OMT today.  Tolerated well per note below. - Decision today to treat with OMT was based on Physical Exam   After verbal consent patient was treated with HVLA (high velocity low amplitude), ME (muscle energy), FPR (flex positional release), ST (soft tissue), PC/PD (Pelvic Compression/ Pelvic Decompression) techniques in cervical, rib, thoracic, lumbar, and pelvic areas. Patient tolerated the procedure well with improvement in symptoms.  Patient educated on potential side effects of soreness and recommended to rest, hydrate, and use Tylenol as needed for pain control.   Pertinent previous records reviewed include ***    Follow Up: ***     Subjective:   I, Lindsey Stevenson, am serving as a Neurosurgeon for Doctor Richardean Sale   Chief Complaint: right inguinal pain    HPI:    10/01/22 Patient is a 28 year old female complaining of right inguinal pain. Patient states that she has had the pain intermittently since 2018. OB hasn't found anything , ED told her it was maybe a sports hernia, she does states that she has increased activity. 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 No numbness and tingling. States that her right leg will get heavier. Feel as though her circulation is a little different when the pain is flared. Ibu and tylenol don't help. Did have a CT done    11/06/2022 Patient states that she has been okay    12/10/2022 Patient states that she is okay, just here for a tune up    01/10/2023 Patient states that she is pretty good , here for a tune up     02/07/2023 Patient states she is okay has new pain spots   03/07/2023 Patient states she is good . Baby is pushing on the right side    03/21/2023 Patient states  she had a sharp side stitch. That's where the  baby kicked    04/04/2023 Patient states that she can feel her pelvis is shifting, she can feel it when she takes wider steps. Isnt able to sleep on side causes [ain   04/18/2023 Patient states   Relevant Historical Information: Current pregnancy  Additional pertinent review of systems negative.  Current Outpatient Medications  Medication Sig Dispense Refill   acetaminophen (TYLENOL) 500 MG tablet Take 500 mg by mouth every 6 (six) hours as needed for moderate pain.     cetirizine (ZYRTEC ALLERGY) 10 MG tablet Take 1 tablet (10 mg total) by mouth daily. 30 tablet 2   cholecalciferol (VITAMIN D3) 25 MCG (1000 UNIT) tablet Take 2,000 Units by mouth daily.     fluticasone (FLONASE) 50 MCG/ACT nasal spray Place 2 sprays into both nostrils daily. 9.9 mL 2   Prenatal Vit-Fe Fumarate-FA (PRENATAL MULTIVITAMIN) TABS tablet Take 1 tablet by mouth daily at 12 noon.     simethicone (MYLICON) 125 MG chewable tablet Chew 125 mg by mouth every 6 (six) hours as needed for flatulence. Takes 1-2 as needed     No current facility-administered medications for this visit.  Objective:     There were no vitals filed for this visit.    There is no height or weight on file to calculate BMI.    Physical Exam:     General: Well-appearing, cooperative, sitting comfortably in no acute distress.   OMT Physical Exam:  ASIS Compression Test: Positive Right Cervical: TTP paraspinal, *** Rib: Bilateral elevated first rib with TTP Thoracic: TTP paraspinal,*** Lumbar: TTP paraspinal,*** Pelvis: Right anterior innominate  Electronically signed by:  Aleen Sells D.[facility] 7:32 AM 04/17/23

## 2023-04-18 ENCOUNTER — Ambulatory Visit: Payer: Medicaid Other | Admitting: Sports Medicine

## 2023-04-21 NOTE — Progress Notes (Deleted)
Lindsey Stevenson D.[facility] [address] [place] [address] Phone: [phone]   Assessment and Plan:     There are no diagnoses linked to this encounter.  *** - Patient has received relief with OMT in the past.  Elects for repeat OMT today.  Tolerated well per note below. - Decision today to treat with OMT was based on Physical Exam   After verbal consent patient was treated with HVLA (high velocity low amplitude), ME (muscle energy), FPR (flex positional release), ST (soft tissue), PC/PD (Pelvic Compression/ Pelvic Decompression) techniques in cervical, rib, thoracic, lumbar, and pelvic areas. Patient tolerated the procedure well with improvement in symptoms.  Patient educated on potential side effects of soreness and recommended to rest, hydrate, and use Tylenol as needed for pain control.   Pertinent previous records reviewed include ***    Follow Up: ***     Subjective:   I, Lindsey Stevenson, am serving as a Neurosurgeon for Doctor Richardean Sale   Chief Complaint: right inguinal pain    HPI:    10/01/22 Patient is a 28 year old female complaining of right inguinal pain. Patient states that she has had the pain intermittently since 2018. OB hasn't found anything , ED told her it was maybe a sports hernia, she does states that she has increased activity. 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 No numbness and tingling. States that her right leg will get heavier. Feel as though her circulation is a little different when the pain is flared. Ibu and tylenol don't help. Did have a CT done    11/06/2022 Patient states that she has been okay    12/10/2022 Patient states that she is okay, just here for a tune up    01/10/2023 Patient states that she is pretty good , here for a tune up     02/07/2023 Patient states she is okay has new pain spots   03/07/2023 Patient states she is good . Baby is pushing on the right side    03/21/2023 Patient states  she had a sharp side stitch. That's where the  baby kicked    04/04/2023 Patient states that she can feel her pelvis is shifting, she can feel it when she takes wider steps. Isnt able to sleep on side causes [ain   04/22/2023 Patient states   Relevant Historical Information: Current pregnancy  Additional pertinent review of systems negative.  Current Outpatient Medications  Medication Sig Dispense Refill   acetaminophen (TYLENOL) 500 MG tablet Take 500 mg by mouth every 6 (six) hours as needed for moderate pain.     cetirizine (ZYRTEC ALLERGY) 10 MG tablet Take 1 tablet (10 mg total) by mouth daily. 30 tablet 2   cholecalciferol (VITAMIN D3) 25 MCG (1000 UNIT) tablet Take 2,000 Units by mouth daily.     fluticasone (FLONASE) 50 MCG/ACT nasal spray Place 2 sprays into both nostrils daily. 9.9 mL 2   Prenatal Vit-Fe Fumarate-FA (PRENATAL MULTIVITAMIN) TABS tablet Take 1 tablet by mouth daily at 12 noon.     simethicone (MYLICON) 125 MG chewable tablet Chew 125 mg by mouth every 6 (six) hours as needed for flatulence. Takes 1-2 as needed     No current facility-administered medications for this visit.  Objective:     There were no vitals filed for this visit.    There is no height or weight on file to calculate BMI.    Physical Exam:     General: Well-appearing, cooperative, sitting comfortably in no acute distress.   OMT Physical Exam:  ASIS Compression Test: Positive Right Cervical: TTP paraspinal, *** Rib: Bilateral elevated first rib with TTP Thoracic: TTP paraspinal,*** Lumbar: TTP paraspinal,*** Pelvis: Right anterior innominate  Electronically signed by:  Lindsey Stevenson [facility] 7:28 AM 04/21/23

## 2023-04-22 ENCOUNTER — Ambulatory Visit: Payer: Medicaid Other | Admitting: Sports Medicine

## 2023-04-24 NOTE — Progress Notes (Signed)
Lindsey Stevenson D.[facility] [address] [place] [address] Phone: [phone]   Assessment and Plan:     1. Chronic bilateral low back pain without sciatica 2. [redacted] weeks gestation of pregnancy 3. Somatic dysfunction of lumbar region 4. Somatic dysfunction of pelvic region 5. Somatic dysfunction of sacral region  -Chronic with exacerbation, subsequent visit - Recurrence of low back and pelvic pain with patient at [redacted] weeks gestation - Patient has received relief with OMT in the past.  Elects for repeat OMT today.  Tolerated well per note below. - Decision today to treat with OMT was based on Physical Exam   After verbal consent patient was treated with HVLA (high velocity low amplitude), ME (muscle energy), FPR (flex positional release), ST (soft tissue), PC/PD (Pelvic Compression/ Pelvic Decompression) techniques in sacrum, lumbar, and pelvic areas. Patient tolerated the procedure well with improvement in symptoms.  Patient educated on potential side effects of soreness and recommended to rest, hydrate, and use Tylenol as needed for pain control.   Pertinent previous records reviewed include none  Follow Up: 2 weeks for reevaluation.  Could consider repeat OMT   Subjective:   I, Lindsey Stevenson, am serving as a Neurosurgeon for Doctor Lindsey Stevenson   Chief Complaint: right inguinal pain    HPI:    10/01/22 Patient is a 28 year old female complaining of right inguinal pain. Patient states that she has had the pain intermittently since 2018. OB hasn't found anything , ED told her it was maybe a sports hernia, she does states that she has increased activity. 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 No numbness and tingling. States that her right leg will get heavier. Feel as though her  circulation is a little different when the pain is flared. Ibu and tylenol don't help. Did have a CT done    11/06/2022 Patient states that she has been okay    12/10/2022 Patient states that she is okay, just here for a tune up    01/10/2023 Patient states that she is pretty good , here for a tune up    02/07/2023 Patient states she is okay has new pain spots   03/07/2023 Patient states she is good . Baby is pushing on the right side    03/21/2023 Patient states  she had a sharp side stitch. That's where the  baby kicked    04/04/2023 Patient states that she can feel her pelvis is shifting, she can feel it when she takes wider steps. Isnt able to sleep on side causes [ain   04/25/2023 Patient states right hip pain,pelvis pain when  lifting leg and turning in bed    Relevant Historical Information: Current pregnancy  Additional pertinent review of systems negative.  Current Outpatient Medications  Medication Sig Dispense Refill   acetaminophen (TYLENOL) 500 MG tablet Take 500 mg by mouth every 6 (six) hours as needed for moderate pain.     cetirizine (ZYRTEC ALLERGY) 10 MG tablet Take 1 tablet (10 mg total) by mouth daily. 30 tablet 2   cholecalciferol (VITAMIN D3) 25 MCG (1000 UNIT) tablet Take 2,000 Units by mouth daily.     fluticasone (FLONASE) 50 MCG/ACT nasal spray Place 2 sprays into both nostrils daily. 9.9 mL 2  Prenatal Vit-Fe Fumarate-FA (PRENATAL MULTIVITAMIN) TABS tablet Take 1 tablet by mouth daily at 12 noon.     simethicone (MYLICON) 125 MG chewable tablet Chew 125 mg by mouth every 6 (six) hours as needed for flatulence. Takes 1-2 as needed     No current facility-administered medications for this visit.      Objective:     Vitals:   04/25/23 0926  BP: 118/70  Pulse: (!) 107  SpO2: 98%  Weight: 187 lb (84.8 kg)  Height: 5\' 7"  (1.702 m)      Body mass index is 29.29 kg/m.    Physical Exam:     General: Well-appearing, cooperative, sitting comfortably  in no acute distress.   OMT Physical Exam:  ASIS Compression Test: Positive Right Sacrum: Positive sphinx, TTP bilateral sacral base Lumbar: TTP paraspinal, L1-3 RRSL Pelvis: Right anterior innominate  Electronically signed by:  Lindsey Stevenson D.[facility] 11:04 AM 04/25/23

## 2023-04-25 ENCOUNTER — Ambulatory Visit (INDEPENDENT_AMBULATORY_CARE_PROVIDER_SITE_OTHER): Payer: Medicaid Other | Admitting: Sports Medicine

## 2023-04-25 VITALS — BP 118/70 | HR 107 | Ht 67.0 in | Wt 187.0 lb

## 2023-04-25 DIAGNOSIS — M9903 Segmental and somatic dysfunction of lumbar region: Secondary | ICD-10-CM

## 2023-04-25 DIAGNOSIS — M9905 Segmental and somatic dysfunction of pelvic region: Secondary | ICD-10-CM

## 2023-04-25 DIAGNOSIS — M9904 Segmental and somatic dysfunction of sacral region: Secondary | ICD-10-CM

## 2023-04-25 DIAGNOSIS — Z3A37 37 weeks gestation of pregnancy: Secondary | ICD-10-CM

## 2023-04-25 DIAGNOSIS — G8929 Other chronic pain: Secondary | ICD-10-CM | POA: Diagnosis not present

## 2023-04-25 DIAGNOSIS — M545 Low back pain, unspecified: Secondary | ICD-10-CM | POA: Diagnosis not present

## 2023-05-07 NOTE — Progress Notes (Signed)
 [facility] [address] [place] [address] Phone: [phone]   Assessment and Plan:     1. Chronic bilateral low back pain without sciatica 2. [redacted] weeks gestation of pregnancy 3. Somatic dysfunction of lumbar region 4. Somatic dysfunction of pelvic region 5. Somatic dysfunction of sacral region 6. Somatic dysfunction of thoracic region 7. Somatic dysfunction of upper extremities 8. Acute pain of right shoulder [M25.511]  -Chronic with exacerbation, subsequent visit - Recurrence in low back and pelvic pain, with new right sided shoulder pain with patient at [redacted] weeks gestation - Patient has received relief with OMT in the past.  Elects for repeat OMT today.  Tolerated well per note below. - Decision today to treat with OMT was based on Physical Exam   After verbal consent patient was treated with HVLA (high velocity low amplitude), ME (muscle energy), FPR (flex positional release), ST (soft tissue), PC/PD (Pelvic Compression/ Pelvic Decompression) techniques in upper extremity, sacrum, thoracic, lumbar, and pelvic areas. Patient tolerated the procedure well with improvement in symptoms.  Patient educated on potential side effects of soreness and recommended to rest, hydrate, and use Tylenol  as needed for pain control.   Pertinent previous records reviewed include OB/GYN hospital admission note 05/08/2023  Follow Up: As needed after delivery if patient would like to discuss additional OMT   Subjective:   I, Lindsey Stevenson, am serving as a neurosurgeon for Doctor Morene Mace   Chief Complaint: right inguinal pain    HPI:    10/01/22 Patient is a 28 year old female complaining of right inguinal pain. Patient states that she has had the pain intermittently since 2018. OB hasn't found anything , ED told her it was maybe a sports hernia, she does states that she has increased activity. Does have two children that she lifts, she can't walk for  ling periods of time where she will be doubled over in pain.when the pain is really bad it radiates to the back but the pain is usually in the same area. No numbness and tingling. States that her right leg will get heavier. Feel as though her circulation is a little different when the pain is flared. Ibu and tylenol  don't help. Did have a CT done    11/06/2022 Patient states that she has been okay    12/10/2022 Patient states that she is okay, just here for a tune up    01/10/2023 Patient states that she is pretty good , here for a tune up    02/07/2023 Patient states she is okay has new pain spots   03/07/2023 Patient states she is good . Baby is pushing on the right side    03/21/2023 Patient states  she had a sharp side stitch. That's where the  baby kicked    04/04/2023 Patient states that she can feel her pelvis is shifting, she can feel it when she takes wider steps. Isnt able to sleep on side causes [ain    04/25/2023 Patient states right hip pain,pelvis pain when  lifting leg and turning in bed   05/08/2023 Patient states thinks she hurt her shoulder. But other than that she has been okay   Relevant Historical Information: Current pregnancy  Additional pertinent review of systems negative.  Current Outpatient Medications  Medication Sig Dispense Refill   acetaminophen  (TYLENOL ) 500 MG tablet Take 500 mg by mouth every 6 (six) hours as needed for moderate pain.     cetirizine  (ZYRTEC  ALLERGY)  10 MG tablet Take 1 tablet (10 mg total) by mouth daily. 30 tablet 2   fluticasone  (FLONASE ) 50 MCG/ACT nasal spray Place 2 sprays into both nostrils daily. 9.9 mL 2   Prenatal Vit-Fe Fumarate-FA (PRENATAL MULTIVITAMIN) TABS tablet Take 1 tablet by mouth daily at 12 noon.     simethicone  (MYLICON) 125 MG chewable tablet Chew 125 mg by mouth every 6 (six) hours as needed for flatulence. Takes 1-2 as needed     No current facility-administered medications for this visit.      Objective:      Vitals:   05/08/23 0956  BP: 102/70  Pulse: 91  SpO2: 98%  Weight: 218 lb (98.9 kg)  Height: 5' 7 (1.702 m)      Body mass index is 34.14 kg/m.    Physical Exam:     General: Well-appearing, cooperative, sitting comfortably in no acute distress.   OMT Physical Exam:  ASIS Compression Test: Positive Right Sacrum: Positive sphinx, TTP bilateral sacral base Upper extremity, right: Pain with internal rotation and abduction.  Spencers technique performed Thoracic: TTP paraspinal, T4-6 RRSL Lumbar: TTP paraspinal, L1-3 RRSL, L5 RL Pelvis: Right anterior innominate  Electronically signed by:  Odis Mace D.[facility] 10:29 AM 05/08/23

## 2023-05-08 ENCOUNTER — Encounter (HOSPITAL_COMMUNITY): Payer: Self-pay | Admitting: Obstetrics and Gynecology

## 2023-05-08 ENCOUNTER — Inpatient Hospital Stay (HOSPITAL_COMMUNITY)
Admission: AD | Admit: 2023-05-08 | Discharge: 2023-05-08 | Disposition: A | Discharge status: Home / Self Care | Payer: Medicaid Other | Attending: Obstetrics and Gynecology | Admitting: Obstetrics and Gynecology

## 2023-05-08 ENCOUNTER — Ambulatory Visit: Payer: Medicaid Other | Admitting: Sports Medicine

## 2023-05-08 VITALS — BP 102/70 | HR 91 | Ht 67.0 in | Wt 218.0 lb

## 2023-05-08 DIAGNOSIS — Z3A39 39 weeks gestation of pregnancy: Secondary | ICD-10-CM | POA: Diagnosis not present

## 2023-05-08 DIAGNOSIS — M9905 Segmental and somatic dysfunction of pelvic region: Secondary | ICD-10-CM | POA: Diagnosis not present

## 2023-05-08 DIAGNOSIS — G8929 Other chronic pain: Secondary | ICD-10-CM | POA: Diagnosis not present

## 2023-05-08 DIAGNOSIS — M9902 Segmental and somatic dysfunction of thoracic region: Secondary | ICD-10-CM

## 2023-05-08 DIAGNOSIS — M25511 Pain in right shoulder: Secondary | ICD-10-CM | POA: Diagnosis not present

## 2023-05-08 DIAGNOSIS — Z8759 Personal history of other complications of pregnancy, childbirth and the puerperium: Secondary | ICD-10-CM | POA: Diagnosis not present

## 2023-05-08 DIAGNOSIS — Z8669 Personal history of other diseases of the nervous system and sense organs: Secondary | ICD-10-CM | POA: Diagnosis not present

## 2023-05-08 DIAGNOSIS — M545 Low back pain, unspecified: Secondary | ICD-10-CM

## 2023-05-08 DIAGNOSIS — Z3689 Encounter for other specified antenatal screening: Secondary | ICD-10-CM

## 2023-05-08 DIAGNOSIS — M9907 Segmental and somatic dysfunction of upper extremity: Secondary | ICD-10-CM

## 2023-05-08 DIAGNOSIS — M9903 Segmental and somatic dysfunction of lumbar region: Secondary | ICD-10-CM | POA: Diagnosis not present

## 2023-05-08 DIAGNOSIS — M9904 Segmental and somatic dysfunction of sacral region: Secondary | ICD-10-CM

## 2023-05-08 LAB — CBC
HCT: 34 % — ABNORMAL LOW (ref 36.0–46.0)
Hemoglobin: 11.3 g/dL — ABNORMAL LOW (ref 12.0–15.0)
MCH: 30.3 pg (ref 26.0–34.0)
MCHC: 33.2 g/dL (ref 30.0–36.0)
MCV: 91.2 fL (ref 80.0–100.0)
Platelets: 207 10*3/uL (ref 150–400)
RBC: 3.73 MIL/uL — ABNORMAL LOW (ref 3.87–5.11)
RDW: 13.8 % (ref 11.5–15.5)
WBC: 11.1 10*3/uL — ABNORMAL HIGH (ref 4.0–10.5)
nRBC: 0 % (ref 0.0–0.2)

## 2023-05-08 LAB — PROTEIN / CREATININE RATIO, URINE
Creatinine, Urine: 53 mg/dL
Protein Creatinine Ratio: 0.13 mg/mg{creat} (ref 0.00–0.15)
Total Protein, Urine: 7 mg/dL

## 2023-05-08 LAB — COMPREHENSIVE METABOLIC PANEL
ALT: 13 U/L (ref 0–44)
AST: 15 U/L (ref 15–41)
Albumin: 2.7 g/dL — ABNORMAL LOW (ref 3.5–5.0)
Alkaline Phosphatase: 89 U/L (ref 38–126)
Anion gap: 8 (ref 5–15)
BUN: 7 mg/dL (ref 6–20)
CO2: 21 mmol/L — ABNORMAL LOW (ref 22–32)
Calcium: 9 mg/dL (ref 8.9–10.3)
Chloride: 106 mmol/L (ref 98–111)
Creatinine, Ser: 0.65 mg/dL (ref 0.44–1.00)
GFR, Estimated: >60 mL/min (ref >60)
Glucose, Bld: 93 mg/dL (ref 70–99)
Potassium: 4.1 mmol/L (ref 3.5–5.1)
Sodium: 135 mmol/L (ref 135–145)
Total Bilirubin: <0.2 mg/dL (ref 0.0–1.2)
Total Protein: 5.9 g/dL — ABNORMAL LOW (ref 6.5–8.1)

## 2023-05-08 MED ORDER — CYCLOBENZAPRINE HCL 5 MG PO TABS
10.0000 mg | ORAL_TABLET | Freq: Once | ORAL | Status: DC
  Filled 2023-05-08: qty 2

## 2023-05-08 MED ORDER — METOCLOPRAMIDE HCL 10 MG PO TABS
10.0000 mg | ORAL_TABLET | Freq: Once | ORAL | Status: DC
  Filled 2023-05-08: qty 1

## 2023-05-08 MED ORDER — DIPHENHYDRAMINE HCL 25 MG PO CAPS
25.0000 mg | ORAL_CAPSULE | Freq: Once | ORAL | Status: DC
  Filled 2023-05-08: qty 1

## 2023-05-08 NOTE — MAU Note (Signed)
.  Lindsey Stevenson is a 28 y.o. at [redacted]w[redacted]d here in MAU reporting:L sided headache at 2100 took tylenol  got up to use bathroom  0030 and headache was worse took BP 116/92 repeated 130/87 x2 then called MD and was told to come in for evaluation Does have a history of migraines-has not had elevated BP in pregnancy  Denies regular contractions-intermittent cramping Denies SROM, vaginal bleeding or bloody show. Endorses + fetal movement  Onset of complaint: 2100 Pain score:  8 intermittent L side of anterior head Vitals:   05/08/23 0155  BP: 129/67  Pulse: 83  Resp: 17  Temp: 98.4 F (36.9 C)  SpO2: 100%     FHT: 130bpm  Lab orders placed from triage: see orders

## 2023-05-08 NOTE — MAU Provider Note (Signed)
 MAU Provider Note  Chief Complaint: Hypertension and Headache  SUBJECTIVE HPI: Lindsey Stevenson is a 28 y.o. G3P2002 at [redacted]w[redacted]d by LMP who presents to maternity admissions reporting migraine, elevated HBP. Pregnancy c/b hx migraines, SVT. Receives [facility] with [facility].  Patient presents with migraine starting last night around 9 PM. Went to sleep and did not feel better. Hasn't been having migraines while pregnant very often, but this is the same symptoms as her usual migraines. Left side of head pulsating. No vision changes, RUQ pain, extremity swelling. Did check her BP at home 116/92, 130/87. Mild photophobia. Tylenol  minimally helpful. Denies VB, LOF, contractions. +FM.  HPI  Past Medical History:  Diagnosis Date   Anxiety    Depression    Migraine    SVT (supraventricular tachycardia) (HCC)    Vaginal Pap smear, abnormal 2019   HPV   Past Surgical History:  Procedure Laterality Date   NO PAST SURGERIES     Social History   Socioeconomic History   Marital status: Married    Spouse name: Jared   Number of children: 3   Years of education: Not on file   Highest education level: Not on file  Occupational History   Not on file  Tobacco Use   Smoking status: Never   Smokeless tobacco: Never  Vaping Use   Vaping status: Never Used  Substance and Sexual Activity   Alcohol use: Not Currently   Drug use: Never   Sexual activity: Yes    Birth control/protection: None  Other Topics Concern   Not on file  Social History Narrative   Right Handed    No Caffeine     Social Drivers of Corporate Investment Banker Strain: Not on file  Food Insecurity: Not on file  Transportation Needs: Not on file  Physical Activity: Not on file  Stress: Not on file  Social Connections: Not on file  Intimate Partner Violence: Not on file   No current facility-administered medications on file prior to encounter.   Current Outpatient Medications on File Prior to Encounter  Medication Sig  Dispense Refill   acetaminophen  (TYLENOL ) 500 MG tablet Take 500 mg by mouth every 6 (six) hours as needed for moderate pain.     cetirizine  (ZYRTEC  ALLERGY) 10 MG tablet Take 1 tablet (10 mg total) by mouth daily. 30 tablet 2   cholecalciferol (VITAMIN D3) 25 MCG (1000 UNIT) tablet Take 2,000 Units by mouth daily.     fluticasone  (FLONASE ) 50 MCG/ACT nasal spray Place 2 sprays into both nostrils daily. 9.9 mL 2   Prenatal Vit-Fe Fumarate-FA (PRENATAL MULTIVITAMIN) TABS tablet Take 1 tablet by mouth daily at 12 noon.     simethicone  (MYLICON) 125 MG chewable tablet Chew 125 mg by mouth every 6 (six) hours as needed for flatulence. Takes 1-2 as needed     No Known Allergies  ROS:  Pertinent positives/negatives listed above.  I have reviewed patient's Past Medical Hx, Surgical Hx, Family Hx, Social Hx, medications and allergies.   Physical Exam  Patient Vitals for the past 24 hrs:  BP Temp Temp src Pulse Resp SpO2 Height Weight  05/08/23 0225 -- -- -- -- -- 100 % -- --  05/08/23 0219 123/70 -- -- 84 17 100 % -- --  05/08/23 0215 -- -- -- -- -- 100 % -- --  05/08/23 0155 129/67 98.4 F (36.9 C) Oral 83 17 100 % 5' 7 (1.702 m) 98.9 kg   Constitutional: Well-developed, well-nourished female in  no acute distress  Cardiovascular: normal rate Respiratory: normal effort GI: Abd soft, non-tender MS: Extremities nontender, no edema, normal ROM Neurologic: Alert and oriented x 4. No gross abnormalities, logical/linear thought process GU: Neg CVAT  FHT:  Baseline 125, moderate variability, accelerations present, no decelerations Contractions: 4+ min apart  LAB RESULTS Results for orders placed or performed during the hospital encounter of 05/08/23 (from the past 24 hours)  CBC     Status: Abnormal   Collection Time: 05/08/23  1:35 AM  Result Value Ref Range   WBC 11.1 (H) 4.0 - 10.5 K/uL   RBC 3.73 (L) 3.87 - 5.11 MIL/uL   Hemoglobin 11.3 (L) 12.0 - 15.0 g/dL   HCT 65.9 (L) 63.9 - 53.9  %   MCV 91.2 80.0 - 100.0 fL   MCH 30.3 26.0 - 34.0 pg   MCHC 33.2 30.0 - 36.0 g/dL   RDW 86.1 88.4 - 84.4 %   Platelets 207 150 - 400 K/uL   nRBC 0.0 0.0 - 0.2 %  Comprehensive metabolic panel     Status: Abnormal   Collection Time: 05/08/23  1:35 AM  Result Value Ref Range   Sodium 135 135 - 145 mmol/L   Potassium 4.1 3.5 - 5.1 mmol/L   Chloride 106 98 - 111 mmol/L   CO2 21 (L) 22 - 32 mmol/L   Glucose, Bld 93 70 - 99 mg/dL   BUN 7 6 - 20 mg/dL   Creatinine, Ser 9.34 0.44 - 1.00 mg/dL   Calcium 9.0 8.9 - 89.6 mg/dL   Total Protein 5.9 (L) 6.5 - 8.1 g/dL   Albumin 2.7 (L) 3.5 - 5.0 g/dL   AST 15 15 - 41 U/L   ALT 13 0 - 44 U/L   Alkaline Phosphatase 89 38 - 126 U/L   Total Bilirubin <0.2 0.0 - 1.2 mg/dL   GFR, Estimated >39 >39 mL/min   Anion gap 8 5 - 15  Protein / creatinine ratio, urine     Status: None   Collection Time: 05/08/23  1:39 AM  Result Value Ref Range   Creatinine, Urine 53 mg/dL   Total Protein, Urine 7 mg/dL   Protein Creatinine Ratio 0.13 0.00 - 0.15 mg/mg[Cre]       IMAGING No results found.  MAU Management/MDM: Orders Placed This Encounter  Procedures   CBC   Comprehensive metabolic panel   Protein / creatinine ratio, urine   Discharge patient    Meds ordered this encounter  Medications   diphenhydrAMINE  (BENADRYL ) capsule 25 mg   cyclobenzaprine  (FLEXERIL ) tablet 10 mg   metoCLOPramide  (REGLAN ) tablet 10 mg     Available prenatal records reviewed.  Patient presents for migraine and borderline BP at home at [redacted]w[redacted]d with hx of migraines. BP reassuringly wnl here, and preE labs normal. Will treat with benadryl , reglan , flexeril  for symptoms.   After evaluation, patient requested to go home and sleep vs taking medications for migraine. PreE ruled-out and reassuring fetal status, so will discharge home.  FWB: reactive NST. +FM.  ASSESSMENT 1. Hx of migraine during pregnancy   2. NST (non-stress test) reactive   3. [redacted] weeks gestation of  pregnancy     PLAN Discharge home with strict return precautions. Allergies as of 05/08/2023   No Known Allergies      Medication List     STOP taking these medications    cholecalciferol 25 MCG (1000 UNIT) tablet Commonly known as: VITAMIN D3  TAKE these medications    acetaminophen  500 MG tablet Commonly known as: TYLENOL  Take 500 mg by mouth every 6 (six) hours as needed for moderate pain.   cetirizine  10 MG tablet Commonly known as: ZyrTEC  Allergy Take 1 tablet (10 mg total) by mouth daily.   fluticasone  50 MCG/ACT nasal spray Commonly known as: FLONASE  Place 2 sprays into both nostrils daily.   prenatal multivitamin Tabs tablet Take 1 tablet by mouth daily at 12 noon.   simethicone  125 MG chewable tablet Commonly known as: MYLICON Chew 125 mg by mouth every 6 (six) hours as needed for flatulence. Takes 1-2 as needed         Almarie Moats, MD OB Fellow 05/08/2023  2:44 AM

## 2023-05-14 ENCOUNTER — Telehealth (HOSPITAL_COMMUNITY): Payer: Self-pay | Admitting: *Deleted

## 2023-05-14 NOTE — Telephone Encounter (Signed)
Preadmission screen

## 2023-05-15 ENCOUNTER — Encounter (HOSPITAL_COMMUNITY): Payer: Self-pay | Admitting: *Deleted

## 2023-05-19 ENCOUNTER — Other Ambulatory Visit: Payer: Self-pay | Admitting: Obstetrics and Gynecology

## 2023-05-19 DIAGNOSIS — O480 Post-term pregnancy: Secondary | ICD-10-CM

## 2023-05-20 ENCOUNTER — Inpatient Hospital Stay (HOSPITAL_COMMUNITY)
Admission: RE | Admit: 2023-05-20 | Discharge: 2023-05-22 | DRG: 807 | Disposition: A | Discharge status: Home / Self Care | Payer: Medicaid Other | Attending: Obstetrics and Gynecology | Admitting: Obstetrics and Gynecology

## 2023-05-20 ENCOUNTER — Other Ambulatory Visit: Payer: Self-pay

## 2023-05-20 ENCOUNTER — Encounter (HOSPITAL_COMMUNITY): Payer: Self-pay | Admitting: Obstetrics and Gynecology

## 2023-05-20 ENCOUNTER — Inpatient Hospital Stay (HOSPITAL_COMMUNITY): Payer: Medicaid Other | Admitting: Anesthesiology

## 2023-05-20 ENCOUNTER — Inpatient Hospital Stay (HOSPITAL_COMMUNITY): Payer: Medicaid Other

## 2023-05-20 DIAGNOSIS — Z3A41 41 weeks gestation of pregnancy: Secondary | ICD-10-CM

## 2023-05-20 DIAGNOSIS — O99214 Obesity complicating childbirth: Secondary | ICD-10-CM | POA: Diagnosis present

## 2023-05-20 DIAGNOSIS — O9962 Diseases of the digestive system complicating childbirth: Secondary | ICD-10-CM | POA: Diagnosis present

## 2023-05-20 DIAGNOSIS — Z833 Family history of diabetes mellitus: Secondary | ICD-10-CM | POA: Diagnosis not present

## 2023-05-20 DIAGNOSIS — K219 Gastro-esophageal reflux disease without esophagitis: Secondary | ICD-10-CM | POA: Diagnosis present

## 2023-05-20 DIAGNOSIS — O480 Post-term pregnancy: Secondary | ICD-10-CM | POA: Diagnosis present

## 2023-05-20 DIAGNOSIS — Z8249 Family history of ischemic heart disease and other diseases of the circulatory system: Secondary | ICD-10-CM

## 2023-05-20 LAB — CBC
HCT: 36.3 % (ref 36.0–46.0)
Hemoglobin: 12 g/dL (ref 12.0–15.0)
MCH: 30.1 pg (ref 26.0–34.0)
MCHC: 33.1 g/dL (ref 30.0–36.0)
MCV: 91 fL (ref 80.0–100.0)
Platelets: 227 10*3/uL (ref 150–400)
RBC: 3.99 MIL/uL (ref 3.87–5.11)
RDW: 13.7 % (ref 11.5–15.5)
WBC: 10.7 10*3/uL — ABNORMAL HIGH (ref 4.0–10.5)
nRBC: 0 % (ref 0.0–0.2)

## 2023-05-20 LAB — PROTEIN / CREATININE RATIO, URINE
Creatinine, Urine: 54 mg/dL
Protein Creatinine Ratio: 0.13 mg/mg{creat} (ref 0.00–0.15)
Total Protein, Urine: 7 mg/dL

## 2023-05-20 LAB — COMPREHENSIVE METABOLIC PANEL
ALT: 7 U/L (ref 0–44)
AST: 33 U/L (ref 15–41)
Albumin: 2.7 g/dL — ABNORMAL LOW (ref 3.5–5.0)
Alkaline Phosphatase: 88 U/L (ref 38–126)
Anion gap: 10 (ref 5–15)
BUN: 9 mg/dL (ref 6–20)
CO2: 21 mmol/L — ABNORMAL LOW (ref 22–32)
Calcium: 9.2 mg/dL (ref 8.9–10.3)
Chloride: 105 mmol/L (ref 98–111)
Creatinine, Ser: 0.76 mg/dL (ref 0.44–1.00)
GFR, Estimated: >60 mL/min (ref >60)
Glucose, Bld: 91 mg/dL (ref 70–99)
Potassium: 3.9 mmol/L (ref 3.5–5.1)
Sodium: 136 mmol/L (ref 135–145)
Total Bilirubin: 0.3 mg/dL (ref 0.0–1.2)
Total Protein: 6.2 g/dL — ABNORMAL LOW (ref 6.5–8.1)

## 2023-05-20 LAB — TYPE AND SCREEN
ABO/RH(D): O POS
Antibody Screen: NEGATIVE

## 2023-05-20 LAB — RPR: RPR Ser Ql: NONREACTIVE

## 2023-05-20 MED ORDER — SIMETHICONE 80 MG PO CHEW: 80.0000 mg | CHEWABLE_TABLET | ORAL | Status: DC | PRN

## 2023-05-20 MED ORDER — LACTATED RINGERS IV SOLN: INTRAVENOUS | Status: DC

## 2023-05-20 MED ORDER — DIBUCAINE (PERIANAL) 1 % EX OINT: 1.0000 | TOPICAL_OINTMENT | CUTANEOUS | Status: DC | PRN

## 2023-05-20 MED ORDER — SENNOSIDES-DOCUSATE SODIUM 8.6-50 MG PO TABS
2.0000 | ORAL_TABLET | Freq: Every day | ORAL | Status: DC
  Administered 2023-05-21 – 2023-05-22 (×2): 2 via ORAL
  Filled 2023-05-20 (×2): qty 2

## 2023-05-20 MED ORDER — EPHEDRINE 5 MG/ML INJ: 10.0000 mg | INTRAVENOUS | Status: DC | PRN

## 2023-05-20 MED ORDER — ONDANSETRON HCL 4 MG PO TABS: 4.0000 mg | ORAL_TABLET | ORAL | Status: DC | PRN

## 2023-05-20 MED ORDER — OXYTOCIN-SODIUM CHLORIDE 30-0.9 UT/500ML-% IV SOLN
1.0000 m[IU]/min | INTRAVENOUS | Status: DC
Start: 2023-05-20 — End: 2023-05-20
  Administered 2023-05-20: 2 m[IU]/min via INTRAVENOUS
  Filled 2023-05-20: qty 500

## 2023-05-20 MED ORDER — LACTATED RINGERS IV SOLN: 500.0000 mL | Freq: Once | INTRAVENOUS | Status: DC

## 2023-05-20 MED ORDER — METHYLERGONOVINE MALEATE 0.2 MG/ML IJ SOLN: 0.2000 mg | INTRAMUSCULAR | Status: DC | PRN

## 2023-05-20 MED ORDER — ACETAMINOPHEN 325 MG PO TABS
650.0000 mg | ORAL_TABLET | ORAL | Status: DC | PRN
  Administered 2023-05-21 (×2): 650 mg via ORAL
  Filled 2023-05-20 (×2): qty 2

## 2023-05-20 MED ORDER — ACETAMINOPHEN 325 MG PO TABS: 650.0000 mg | ORAL_TABLET | ORAL | Status: DC | PRN

## 2023-05-20 MED ORDER — METHYLERGONOVINE MALEATE 0.2 MG PO TABS: 0.2000 mg | ORAL_TABLET | ORAL | Status: DC | PRN

## 2023-05-20 MED ORDER — DIPHENHYDRAMINE HCL 25 MG PO CAPS: 25.0000 mg | ORAL_CAPSULE | Freq: Four times a day (QID) | ORAL | Status: DC | PRN

## 2023-05-20 MED ORDER — PHENYLEPHRINE 80 MCG/ML (10ML) SYRINGE FOR IV PUSH (FOR BLOOD PRESSURE SUPPORT): 80.0000 ug | PREFILLED_SYRINGE | INTRAVENOUS | Status: DC | PRN

## 2023-05-20 MED ORDER — COCONUT OIL OIL: 1.0000 | TOPICAL_OIL | Status: DC | PRN

## 2023-05-20 MED ORDER — OXYTOCIN-SODIUM CHLORIDE 30-0.9 UT/500ML-% IV SOLN
2.5000 [IU]/h | INTRAVENOUS | Status: DC
  Administered 2023-05-20: 2.5 [IU]/h via INTRAVENOUS

## 2023-05-20 MED ORDER — PRENATAL MULTIVITAMIN CH
1.0000 | ORAL_TABLET | Freq: Every day | ORAL | Status: DC
  Administered 2023-05-21 – 2023-05-22 (×2): 1 via ORAL
  Filled 2023-05-20 (×2): qty 1

## 2023-05-20 MED ORDER — ONDANSETRON HCL 4 MG/2ML IJ SOLN: 4.0000 mg | INTRAMUSCULAR | Status: DC | PRN

## 2023-05-20 MED ORDER — IBUPROFEN 600 MG PO TABS
600.0000 mg | ORAL_TABLET | Freq: Four times a day (QID) | ORAL | Status: DC
  Administered 2023-05-20 – 2023-05-22 (×6): 600 mg via ORAL
  Filled 2023-05-20 (×7): qty 1

## 2023-05-20 MED ORDER — WITCH HAZEL-GLYCERIN EX PADS: 1.0000 | MEDICATED_PAD | CUTANEOUS | Status: DC | PRN

## 2023-05-20 MED ORDER — FENTANYL-BUPIVACAINE-NACL 0.5-0.125-0.9 MG/250ML-% EP SOLN
12.0000 mL/h | EPIDURAL | Status: DC | PRN
  Administered 2023-05-20: 12 mL/h via EPIDURAL
  Filled 2023-05-20: qty 250

## 2023-05-20 MED ORDER — OXYCODONE-ACETAMINOPHEN 5-325 MG PO TABS: 2.0000 | ORAL_TABLET | ORAL | Status: DC | PRN

## 2023-05-20 MED ORDER — ONDANSETRON HCL 4 MG/2ML IJ SOLN
4.0000 mg | Freq: Four times a day (QID) | INTRAMUSCULAR | Status: DC | PRN
Start: 2023-05-20 — End: 2023-05-20

## 2023-05-20 MED ORDER — SOD CITRATE-CITRIC ACID 500-334 MG/5ML PO SOLN: 30.0000 mL | ORAL | Status: DC | PRN

## 2023-05-20 MED ORDER — OXYTOCIN BOLUS FROM INFUSION
333.0000 mL | Freq: Once | INTRAVENOUS | Status: AC
  Administered 2023-05-20: 333 mL via INTRAVENOUS

## 2023-05-20 MED ORDER — LIDOCAINE HCL (PF) 1 % IJ SOLN: 30.0000 mL | INTRAMUSCULAR | Status: DC | PRN

## 2023-05-20 MED ORDER — BENZOCAINE-MENTHOL 20-0.5 % EX AERO: 1.0000 | INHALATION_SPRAY | CUTANEOUS | Status: DC | PRN

## 2023-05-20 MED ORDER — BUPIVACAINE HCL (PF) 0.25 % IJ SOLN
INTRAMUSCULAR | Status: DC | PRN
  Administered 2023-05-20 (×2): 4 mL via EPIDURAL

## 2023-05-20 MED ORDER — OXYCODONE-ACETAMINOPHEN 5-325 MG PO TABS: 1.0000 | ORAL_TABLET | ORAL | Status: DC | PRN

## 2023-05-20 MED ORDER — ZOLPIDEM TARTRATE 5 MG PO TABS: 5.0000 mg | ORAL_TABLET | Freq: Every evening | ORAL | Status: DC | PRN

## 2023-05-20 MED ORDER — TERBUTALINE SULFATE 1 MG/ML IJ SOLN: 0.2500 mg | Freq: Once | INTRAMUSCULAR | Status: DC | PRN

## 2023-05-20 MED ORDER — DIPHENHYDRAMINE HCL 50 MG/ML IJ SOLN: 12.5000 mg | INTRAMUSCULAR | Status: DC | PRN

## 2023-05-20 MED ORDER — LACTATED RINGERS IV SOLN: 500.0000 mL | INTRAVENOUS | Status: DC | PRN

## 2023-05-20 MED ORDER — FENTANYL CITRATE (PF) 100 MCG/2ML IJ SOLN: 50.0000 ug | INTRAMUSCULAR | Status: DC | PRN

## 2023-05-20 NOTE — Anesthesia Preprocedure Evaluation (Signed)
 Anesthesia Evaluation  Patient identified by MRN, date of birth, ID band Patient awake    Reviewed: Allergy & Precautions, Patient's Chart, lab work & pertinent test results  Airway Mallampati: II       Dental no notable dental hx.    Pulmonary neg pulmonary ROS   Pulmonary exam normal        Cardiovascular negative cardio ROS Normal cardiovascular exam Rhythm:Regular     Neuro/Psych  Headaches PSYCHIATRIC DISORDERS Anxiety Depression       GI/Hepatic Neg liver ROS,GERD  Medicated,,  Endo/Other  Obesity  Renal/GU negative Renal ROS  negative genitourinary   Musculoskeletal negative musculoskeletal ROS (+)    Abdominal Normal abdominal exam  (+)   Peds  Hematology negative hematology ROS (+)   Anesthesia Other Findings   Reproductive/Obstetrics (+) Pregnancy                             Anesthesia Physical Anesthesia Plan  ASA: 2  Anesthesia Plan: Epidural   Post-op Pain Management:    Induction: Intravenous  PONV Risk Score and Plan:   Airway Management Planned: Natural Airway  Additional Equipment: None and Fetal Monitoring  Intra-op Plan:   Post-operative Plan:   Informed Consent: I have reviewed the patients History and Physical, chart, labs and discussed the procedure including the risks, benefits and alternatives for the proposed anesthesia with the patient or authorized representative who has indicated his/her understanding and acceptance.       Plan Discussed with: Anesthesiologist  Anesthesia Plan Comments:        Anesthesia Quick Evaluation

## 2023-05-20 NOTE — Progress Notes (Signed)
  Subjective: Patient is comfortable with her epidural . Feeling slight pressure.   Objective: BP 124/78   Pulse 82   Temp 97.9 F (36.6 C) (Oral)   Resp 16   Ht 5\' 7"  (1.702 m)   Wt 98.7 kg   LMP 08/06/2022   BMI 34.10 kg/m  No intake/output data recorded. No intake/output data recorded.  FHT:  FHR: 130 bpm, variability: moderate,  accelerations:  Present,  decelerations:  Absent UC:   not graphing well  SVE:   Dilation: 8 Effacement (%): 100 Station: 0 Exam by:: lee  Labs: Lab Results  Component Value Date   WBC 10.7 (H) 05/20/2023   HGB 12.0 05/20/2023   HCT 36.3 05/20/2023   MCV 91.0 05/20/2023   PLT 227 05/20/2023    Assessment / Plan: Induction of labor due to postterm,  progressing well on pitocin  Labor:  Progressing on pitocin Preeclampsia:   NA Fetal Wellbeing:  Category I Pain Control:  Epidural I/D:  n/a Anticipated MOD:  NSVD  Gerald Leitz, MD 05/20/2023, 6:30 PM

## 2023-05-20 NOTE — Progress Notes (Signed)
  Subjective:  Patient rates contractions as 3 out of 10. +FM no lof no vaginal bleeding.   Objective: BP 136/75   Pulse 88   Temp 98 F (36.7 C) (Oral)   Resp 16   Ht 5\' 7"  (1.702 m)   Wt 98.7 kg   LMP 08/06/2022   BMI 34.10 kg/m  No intake/output data recorded. No intake/output data recorded.  FHT:  FHR: 130 bpm, variability: moderate,  accelerations:  Present,  decelerations:  Absent UC:   irregular, every 2-5 minutes SVE:  2/80/0 AROM clear fluid   Labs: Lab Results  Component Value Date   WBC 10.7 (H) 05/20/2023   HGB 12.0 05/20/2023   HCT 36.3 05/20/2023   MCV 91.0 05/20/2023   PLT 227 05/20/2023    Assessment / Plan: Induction of labor due to postterm,  progressing well on pitocin  Labor:  continue pitocin as tolerated Preeclampsia:   NA Fetal Wellbeing:  Category I Pain Control:   planning an epidural  I/D:  n/a Anticipated MOD:  NSVD  Gerald Leitz, MD 05/20/2023, 1:32 PM

## 2023-05-20 NOTE — Anesthesia Procedure Notes (Signed)
 Epidural Patient location during procedure: OB Start time: 05/20/2023 1:43 PM End time: 05/20/2023 1:52 PM  Staffing Anesthesiologist: Mal Amabile, MD Performed: anesthesiologist   Preanesthetic Checklist Completed: patient identified, IV checked, site marked, risks and benefits discussed, surgical consent, monitors and equipment checked, pre-op evaluation and timeout performed  Epidural Patient position: sitting Prep: DuraPrep and site prepped and draped Patient monitoring: continuous pulse ox and blood pressure Approach: midline Location: L3-L4 Injection technique: LOR air  Needle:  Needle type: Tuohy  Needle gauge: 17 G Needle length: 9 cm and 9 Needle insertion depth: 7 cm Catheter type: closed end flexible Catheter size: 19 Gauge Catheter at skin depth: 12 cm Test dose: negative and Other  Assessment Events: blood not aspirated, no cerebrospinal fluid, injection not painful, no injection resistance, no paresthesia and negative IV test  Additional Notes Patient identified. Risks and benefits discussed including failed block, incomplete  Pain control, post dural puncture headache, nerve damage, paralysis, blood pressure Changes, nausea, vomiting, reactions to medications-both toxic and allergic and post Partum back pain. All questions were answered. Patient expressed understanding and wished to proceed. Sterile technique was used throughout procedure. Epidural site was Dressed with sterile barrier dressing. No paresthesias, signs of intravascular injection Or signs of intrathecal spread were encountered.  Patient was more comfortable after the epidural was dosed. Please see RN's note for documentation of vital signs and FHR which are stable. Reason for block:procedure for pain

## 2023-05-20 NOTE — H&P (Signed)
 Lindsey Stevenson is a 28 y.o. femaleG3P2002 at 41 weeks and 0 days  presenting for induction of labor due to postdates. Pregnancy has been uncomplicated. Prenatal care provided by Dr. Gerald Leitz with [facility].   05/19/2023 U/S for efw 7 lbs 8 oz (32%ile)  OB History     Gravida  3   Para  2   Term  2   Preterm      AB      Living  2      SAB      IAB      Ectopic      Multiple  0   Live Births  2          Past Medical History:  Diagnosis Date   Anxiety    Depression    Migraine    SVT (supraventricular tachycardia) (HCC)    Vaginal Pap smear, abnormal 2019   HPV   Past Surgical History:  Procedure Laterality Date   NO PAST SURGERIES     Family History: family history includes Anxiety disorder in her mother; Arthritis in her maternal grandmother; Asthma in her brother; Cancer in her maternal grandfather and maternal grandmother; Diabetes in her brother; Early death in her sister; Hypertension in her father, mother, and paternal grandmother; Leukemia in her maternal grandmother. Social History:  reports that she has never smoked. She has never used smokeless tobacco. She reports that she does not currently use alcohol. She reports that she does not use drugs.     Maternal Diabetes: No Genetic Screening: Normal Maternal Ultrasounds/Referrals: Normal Fetal Ultrasounds or other Referrals:  None Maternal Substance Abuse:  No Significant Maternal Medications:  None Significant Maternal Lab Results:  Group B Strep negative Number of Prenatal Visits:greater than 3 verified prenatal visits Maternal Vaccinations:TDap Other Comments:  None  Review of Systems  Constitutional: Negative.   HENT: Negative.    Eyes: Negative.   Respiratory: Negative.    Cardiovascular: Negative.   Gastrointestinal: Negative.   Endocrine: Negative.   Genitourinary: Negative.   Musculoskeletal: Negative.   Skin: Negative.   Allergic/Immunologic: Negative.   Neurological:  Negative.   Psychiatric/Behavioral: Negative.     History Dilation: 2 Effacement (%): 50 Station: -1 Exam by:: lee Blood pressure 122/64, pulse 76, height 5\' 7"  (1.702 m), weight 98.7 kg, last menstrual period 08/06/2022, not currently breastfeeding. Maternal Exam:  Introitus: Normal vulva.   Physical Exam Vitals reviewed.  Constitutional:      Appearance: Normal appearance.  HENT:     Head: Normocephalic and atraumatic.     Nose: Nose normal.  Cardiovascular:     Rate and Rhythm: Normal rate and regular rhythm.     Pulses: Normal pulses.     Heart sounds: Normal heart sounds.  Pulmonary:     Effort: Pulmonary effort is normal.     Breath sounds: Normal breath sounds.  Abdominal:     Tenderness: There is no abdominal tenderness.  Genitourinary:    General: Normal vulva.  Musculoskeletal:        General: Normal range of motion.     Cervical back: Normal range of motion and neck supple.  Skin:    General: Skin is warm and dry.  Neurological:     General: No focal deficit present.     Mental Status: She is alert and oriented to person, place, and time.  Psychiatric:        Mood and Affect: Mood normal.  Behavior: Behavior normal.     Prenatal labs: ABO, Rh: --/--/O POS (02/18 0710) Antibody: NEG (02/18 0710) Rubella: Immune (06/25 0000) RPR: Nonreactive (06/25 0000)  HBsAg: Negative (06/25 0000)  HIV: Non-reactive (06/25 0000)  GBS: Negative/-- (01/16 0000)   Assessment/Plan: 41 weeks and 0 days post dates  - admit to labor and delivery for induction  - Pitocin - pain control per patient preference  - Anticipate svd    Gerald Leitz 05/20/2023, 12:13 PM

## 2023-05-20 NOTE — Lactation Note (Signed)
 This note was copied from a baby's chart. Lactation Consultation Note  Patient Name: Lindsey Stevenson CZYSA'Y Date: 05/20/2023 Age:27 hours  [name] RN in L&D, MOB declined LC services in L&D and MBU, this is her third time breastfeeding and declined LC services.    Maternal Data    Feeding    LATCH Score                    Lactation Tools Discussed/Used    Interventions    Discharge    Consult Status      Frederico Hamman 05/20/2023, 8:34 PM

## 2023-05-21 LAB — CBC
HCT: 32.8 % — ABNORMAL LOW (ref 36.0–46.0)
Hemoglobin: 10.8 g/dL — ABNORMAL LOW (ref 12.0–15.0)
MCH: 29.9 pg (ref 26.0–34.0)
MCHC: 32.9 g/dL (ref 30.0–36.0)
MCV: 90.9 fL (ref 80.0–100.0)
Platelets: 175 10*3/uL (ref 150–400)
RBC: 3.61 MIL/uL — ABNORMAL LOW (ref 3.87–5.11)
RDW: 13.5 % (ref 11.5–15.5)
WBC: 10.4 10*3/uL (ref 4.0–10.5)
nRBC: 0 % (ref 0.0–0.2)

## 2023-05-21 NOTE — Anesthesia Postprocedure Evaluation (Signed)
 Anesthesia Post Note  Patient: Lindsey Stevenson  Procedure(s) Performed: AN AD HOC LABOR EPIDURAL     Patient location during evaluation: Mother Baby Anesthesia Type: Epidural Level of consciousness: awake, awake and alert and oriented Pain management: satisfactory to patient Vital Signs Assessment: post-procedure vital signs reviewed and stable Respiratory status: spontaneous breathing, nonlabored ventilation and respiratory function stable Cardiovascular status: stable and blood pressure returned to baseline Postop Assessment: no headache, no backache, no apparent nausea or vomiting, able to ambulate, adequate PO intake and patient able to bend at knees Anesthetic complications: no   No notable events documented.  Last Vitals:  Vitals:   05/21/23 0157 05/21/23 0543  BP: 112/64 118/84  Pulse: 85 92  Resp: 17 17  Temp:    SpO2: 100% 100%    Last Pain:  Vitals:   05/21/23 0543  TempSrc:   PainSc: 5    Pain Goal:                   Mary-Ann Pennella

## 2023-05-21 NOTE — Social Work (Signed)
 MOB was referred for history of depression/anxiety.  * Referral screened out by Clinical Social Worker because none of the following criteria appear to apply:  ~ History of anxiety/depression during this pregnancy, or of post-partum depression following prior delivery.  ~ Diagnosis of anxiety and/or depression within last 3 years OR * MOB's symptoms currently being treated with medication and/or therapy.  Per chart review MOB was diagnosed with Anxiety in 2020 and Depression in 2022, per OB records no MH concerns noted during this pregnancy.  Please contact the Clinical Social Worker if needs arise, by [facility] request, or if MOB scores greater than 9/yes to question 10 on Edinburgh Postpartum Depression Screen.  Wende Neighbors, LCSWA Clinical Social Worker [phone]

## 2023-05-21 NOTE — Progress Notes (Signed)
 Post Partum Day 1 Subjective: no complaints, up ad lib, voiding, and tolerating PO  Objective: Blood pressure 117/74, pulse 84, temperature 98.3 F (36.8 C), temperature source Oral, resp. rate 16, height 5\' 7"  (1.702 m), weight 98.7 kg, last menstrual period 08/06/2022, SpO2 100%, unknown if currently breastfeeding.  Physical Exam:  General: alert, cooperative, and no distress Lochia: appropriate Uterine Fundus: firm Incision: NA DVT Evaluation: No evidence of DVT seen on physical exam.  Recent Labs    05/20/23 0708 05/21/23 0609  HGB 12.0 10.8*  HCT 36.3 32.8*    Assessment/Plan: Plan for discharge tomorrow, Breastfeeding, and Lactation consult Routine postpartum care   LOS: 1 day   Gerald Leitz, MD 05/21/2023, 12:13 PM

## 2023-05-22 ENCOUNTER — Inpatient Hospital Stay (HOSPITAL_COMMUNITY): Payer: Medicaid Other

## 2023-05-22 MED ORDER — IBUPROFEN 600 MG PO TABS: 600.0000 mg | ORAL_TABLET | Freq: Four times a day (QID) | ORAL | 0 refills | Status: DC | PRN

## 2023-05-22 NOTE — Discharge Summary (Signed)
 Postpartum Discharge Summary     Patient Name: Lindsey Stevenson DOB: 11/10/1995 MRN: [id number]  Date of admission: 05/20/2023 Delivery date:05/20/2023 Delivering provider: Gerald Leitz Date of discharge: 05/22/2023  Admitting diagnosis: Post term pregnancy [O48.0] Intrauterine pregnancy: [redacted]w[redacted]d     Secondary diagnosis:  Principal Problem:   Post term pregnancy  Additional problems: n/a    Discharge diagnosis: Term Pregnancy Delivered                                              Post partum procedures: n/a Augmentation: AROM and Pitocin Complications: None  Hospital course: Induction of Labor With Vaginal Delivery   28 y.o. yo G3P3003 at [redacted]w[redacted]d was admitted to the hospital 05/20/2023 for induction of labor.  Indication for induction: Postdates.  Patient had an labor course that was uncomplicated Membrane Rupture Time/Date: 1:15 PM,05/20/2023  Delivery Method:Vaginal, Spontaneous Operative Delivery:N/A Episiotomy: None Lacerations:  None Details of delivery can be found in separate delivery note.  Patient had a postpartum course that was uncomplicated. Patient is discharged home 05/22/23.  Newborn Data: Birth date:05/20/2023 Birth time:6:43 PM Gender:Female Living status:Living Apgars:9 ,9  Weight:3170 g  Magnesium Sulfate received: No BMZ received: No Rhophylac:No MMR:No T-DaP:Given prenatally Flu: No RSV Vaccine received: No Transfusion:No Immunizations administered: There is no immunization history for the selected administration types on file for this patient.  Physical exam  Vitals:   05/21/23 0543 05/21/23 1050 05/21/23 1731 05/21/23 2157  BP: 118/84 117/74 115/74 118/73  Pulse: 92 84 85 90  Resp: 17 16 16 16   Temp:   98 F (36.7 C) 97.9 F (36.6 C)  TempSrc:   Oral Oral  SpO2: 100% 100% 100%   Weight:      Height:       General: alert, cooperative, and no distress Lochia: appropriate Uterine Fundus: firm Incision: N/A DVT Evaluation: No evidence of  DVT seen on physical exam. Negative Homan's sign. No cords or calf tenderness. No significant calf/ankle edema. Labs: Lab Results  Component Value Date   WBC 10.4 05/21/2023   HGB 10.8 (L) 05/21/2023   HCT 32.8 (L) 05/21/2023   MCV 90.9 05/21/2023   PLT 175 05/21/2023      Latest Ref Rng & Units 05/20/2023    7:08 AM  CMP  Glucose 70 - 99 mg/dL 91   BUN 6 - 20 mg/dL 9   Creatinine 7.82 - 9.56 mg/dL 2.13   Sodium 086 - 578 mmol/L 136   Potassium 3.5 - 5.1 mmol/L 3.9   Chloride 98 - 111 mmol/L 105   CO2 22 - 32 mmol/L 21   Calcium 8.9 - 10.3 mg/dL 9.2   Total Protein 6.5 - 8.1 g/dL 6.2   Total Bilirubin 0.0 - 1.2 mg/dL 0.3   Alkaline Phos 38 - 126 U/L 88   AST 15 - 41 U/L 33   ALT 0 - 44 U/L 7    Edinburgh Score:    05/21/2023    9:00 PM  Edinburgh Postnatal Depression Scale Screening Tool  I have been able to laugh and see the funny side of things. 0  I have looked forward with enjoyment to things. 0  I have blamed myself unnecessarily when things went wrong. 1  I have been anxious or worried for no good reason. 2  I have felt scared or panicky for no  good reason. 2  Things have been getting on top of me. 0  I have been so unhappy that I have had difficulty sleeping. 0  I have felt sad or miserable. 0  I have been so unhappy that I have been crying. 0  The thought of harming myself has occurred to me. 0  Edinburgh Postnatal Depression Scale Total 5      After visit meds:  Allergies as of 05/22/2023   No Known Allergies      Medication List     TAKE these medications    acetaminophen 500 MG tablet Commonly known as: TYLENOL Take 500 mg by mouth every 6 (six) hours as needed for moderate pain.   calcium carbonate 500 MG chewable tablet Commonly known as: TUMS - dosed in mg elemental calcium Chew 3 tablets by mouth daily.   cetirizine 10 MG tablet Commonly known as: ZyrTEC Allergy Take 1 tablet (10 mg total) by mouth daily.   fluticasone 50 MCG/ACT  nasal spray Commonly known as: FLONASE Place 2 sprays into both nostrils daily.   ibuprofen 600 MG tablet Commonly known as: ADVIL Take 1 tablet (600 mg total) by mouth every 6 (six) hours as needed.   prenatal multivitamin Tabs tablet Take 1 tablet by mouth daily at 12 noon.   simethicone 125 MG chewable tablet Commonly known as: MYLICON Chew 125 mg by mouth every 6 (six) hours as needed for flatulence. Takes 1-2 as needed         Discharge home in stable condition Infant Feeding: Bottle and Breast Infant Disposition:home with mother Discharge instruction: per After Visit Summary and Postpartum booklet. Activity: Advance as tolerated. Pelvic rest for 6 weeks.  Diet: routine diet Anticipated Birth Control: unknown Postpartum Appointment:6 weeks Additional Postpartum F/U: Postpartum Depression checkup in 2 weeks Future Appointments: Future Appointments  Date Time Provider Department Center  05/27/2023 10:00 AM Sater, Pearletha Furl, MD [facility] None   Follow up Visit:  Follow-up Information     Gerald Leitz, MD. Schedule an appointment as soon as possible for a visit in 2 week(s).   Specialty: Obstetrics and Gynecology Why: please schedule an appointment for postpartum visit in 2 weeks to evaluate for possible postpatum depression Contact information: [address] [place] [address] [phone]                     05/22/2023 Lavonda Jumbo, DO

## 2023-05-24 ENCOUNTER — Inpatient Hospital Stay (HOSPITAL_COMMUNITY)
Admission: AD | Admit: 2023-05-24 | Discharge: 2023-05-24 | Disposition: A | Discharge status: Home / Self Care | Payer: Medicaid Other | Attending: Obstetrics and Gynecology | Admitting: Obstetrics and Gynecology

## 2023-05-24 ENCOUNTER — Other Ambulatory Visit: Payer: Self-pay

## 2023-05-24 ENCOUNTER — Encounter (HOSPITAL_COMMUNITY): Payer: Self-pay | Admitting: Obstetrics and Gynecology

## 2023-05-24 DIAGNOSIS — R519 Headache, unspecified: Secondary | ICD-10-CM | POA: Diagnosis present

## 2023-05-24 DIAGNOSIS — O9089 Other complications of the puerperium, not elsewhere classified: Secondary | ICD-10-CM | POA: Diagnosis not present

## 2023-05-24 DIAGNOSIS — G4489 Other headache syndrome: Secondary | ICD-10-CM | POA: Diagnosis not present

## 2023-05-24 LAB — URINALYSIS, ROUTINE W REFLEX MICROSCOPIC
Bilirubin Urine: NEGATIVE
Glucose, UA: NEGATIVE mg/dL
Ketones, ur: NEGATIVE mg/dL
Nitrite: NEGATIVE
Protein, ur: NEGATIVE mg/dL
Specific Gravity, Urine: 1.016 (ref 1.005–1.030)
pH: 6 (ref 5.0–8.0)

## 2023-05-24 MED ORDER — ACETAMINOPHEN-CAFFEINE 500-65 MG PO TABS
2.0000 | ORAL_TABLET | Freq: Once | ORAL | Status: AC
  Administered 2023-05-24: 2 via ORAL
  Filled 2023-05-24: qty 2

## 2023-05-24 NOTE — MAU Note (Signed)
.  Lindsey Stevenson is a 28 y.o. at Unknown here in MAU reporting: Headache since last night. 3/10 She took ibuprofen, tylenol, and had a cup of coffee with no relief.   Onset of complaint: yesterday  Pain score: 3 There were no vitals filed for this visit.    Lab orders placed from triage:   ua

## 2023-05-24 NOTE — MAU Provider Note (Incomplete Revision)
 History     CSN: [id number]  Arrival date and time: 05/24/23 4098   Event Date/Time   First Provider Initiated Contact with Patient 05/24/23 2047      Chief Complaint  Patient presents with   Headache   HPI [name] is a 28 y.o. year old G62P3003 female at 4 days PP who presents to MAU reporting H/A since last night unrelieved by medications; rated 3/10. She describes the H/A as being frontal and in her eyes. She took Ibuprofen 600 mg at 0900 or 1000 this AM and Tylenol 1000 mg at 1400 with a cup of coffee. She was d/c'd on Thursday 05/22/2023. She receives [facility] with [facility]; next appt is 06/04/2023.   OB History     Gravida  3   Para  3   Term  3   Preterm      AB      Living  3      SAB      IAB      Ectopic      Multiple  0   Live Births  3           Past Medical History:  Diagnosis Date   Anxiety    Depression    Migraine    SVT (supraventricular tachycardia) (HCC)    Vaginal Pap smear, abnormal 2019   HPV    Past Surgical History:  Procedure Laterality Date   NO PAST SURGERIES      Family History  Problem Relation Age of Onset   Anxiety disorder Mother    Hypertension Mother    Hypertension Father    Early death Sister    Asthma Brother    Diabetes Brother    Cancer Maternal Grandmother    Arthritis Maternal Grandmother    Leukemia Maternal Grandmother    Cancer Maternal Grandfather    Hypertension Paternal Grandmother     Social History   Tobacco Use   Smoking status: Never   Smokeless tobacco: Never  Vaping Use   Vaping status: Never Used  Substance Use Topics   Alcohol use: Not Currently   Drug use: Never    Allergies: No Known Allergies  Medications Prior to Admission  Medication Sig Dispense Refill Last Dose/Taking   acetaminophen (TYLENOL) 500 MG tablet Take 500 mg by mouth every 6 (six) hours as needed for moderate pain.   05/24/2023 at  2:00 PM   ibuprofen (ADVIL) 600 MG tablet Take 1 tablet  (600 mg total) by mouth every 6 (six) hours as needed. 30 tablet 0 05/24/2023 at 11:00 AM   Prenatal Vit-Fe Fumarate-FA (PRENATAL MULTIVITAMIN) TABS tablet Take 1 tablet by mouth daily at 12 noon.   05/24/2023   calcium carbonate (TUMS - DOSED IN MG ELEMENTAL CALCIUM) 500 MG chewable tablet Chew 3 tablets by mouth daily.      cetirizine (ZYRTEC ALLERGY) 10 MG tablet Take 1 tablet (10 mg total) by mouth daily. 30 tablet 2    fluticasone (FLONASE) 50 MCG/ACT nasal spray Place 2 sprays into both nostrils daily. 9.9 mL 2    simethicone (MYLICON) 125 MG chewable tablet Chew 125 mg by mouth every 6 (six) hours as needed for flatulence. Takes 1-2 as needed       Review of Systems  Constitutional: Negative.   HENT: Negative.    Eyes: Negative.   Respiratory: Negative.    Cardiovascular: Negative.   Gastrointestinal: Negative.   Endocrine: Negative.   Genitourinary: Negative.  Musculoskeletal: Negative.   Skin: Negative.   Allergic/Immunologic: Negative.   Neurological:  Positive for headaches (frontal and in both eyes).  Hematological: Negative.   Psychiatric/Behavioral: Negative.     Physical Exam   Blood pressure 124/84, pulse 74, temperature 98 F (36.7 C), temperature source Oral, resp. rate 16, SpO2 100%, currently breastfeeding.  Physical Exam Vitals and nursing note reviewed.  Constitutional:      Appearance: Normal appearance. She is obese.  Cardiovascular:     Rate and Rhythm: Normal rate.  Pulmonary:     Effort: Pulmonary effort is normal.  Genitourinary:    Comments: deferred Musculoskeletal:        General: Normal range of motion.  Skin:    General: Skin is warm and dry.  Neurological:     Mental Status: She is alert and oriented to person, place, and time.  Psychiatric:        Mood and Affect: Mood normal.        Behavior: Behavior normal.        Thought Content: Thought content normal.        Judgment: Judgment normal.     MAU Course   Procedures  MDM CCUA Excedrin Tension H/A 2 caplets --   Results for orders placed or performed during the hospital encounter of 05/24/23 (from the past 24 hours)  Urinalysis, Routine w reflex microscopic -Urine, Clean Catch     Status: Abnormal   Collection Time: 05/24/23  6:29 PM  Result Value Ref Range   Color, Urine YELLOW YELLOW   APPearance HAZY (A) CLEAR   Specific Gravity, Urine 1.016 1.005 - 1.030   pH 6.0 5.0 - 8.0   Glucose, UA NEGATIVE NEGATIVE mg/dL   Hgb urine dipstick LARGE (A) NEGATIVE   Bilirubin Urine NEGATIVE NEGATIVE   Ketones, ur NEGATIVE NEGATIVE mg/dL   Protein, ur NEGATIVE NEGATIVE mg/dL   Nitrite NEGATIVE NEGATIVE   Leukocytes,Ua MODERATE (A) NEGATIVE   RBC / HPF 6-10 0 - 5 RBC/hpf   WBC, UA 11-20 0 - 5 WBC/hpf   Bacteria, UA RARE (A) NONE SEEN   Squamous Epithelial / HPF 6-10 0 - 5 /HPF   Mucus PRESENT     Report given to and care assumed by Gerrit Heck, CNM @ 2105  Raelyn Mora, CNM 05/24/2023, 9:00 PM  Assessment and Plan  Reassessment (10:01 PM) -Nurse reports patient requesting discharge. -Orders placed. -Nurse to give precautions.  Cherre Robins MSN, CNM Advanced Practice Provider, [facility] [employer]

## 2023-05-24 NOTE — Progress Notes (Signed)
 Pt requesting to leave after being given Excedrin. CNM made aware.

## 2023-05-24 NOTE — MAU Provider Note (Cosign Needed)
 History     CSN: [id number]  Arrival date and time: 05/24/23 9629   Event Date/Time   First Provider Initiated Contact with Patient 05/24/23 2047      Chief Complaint  Patient presents with   Headache   HPI [name] is a 28 y.o. year old G42P3003 female at 4 days PP who presents to MAU reporting H/A since last night unrelieved by medications; rated 3/10. She describes the H/A as being frontal and in her eyes. She took Ibuprofen 600 mg at 0900 or 1000 this AM and Tylenol 1000 mg at 1400 with a cup of coffee. She was d/c'd on Thursday 05/22/2023. She receives [facility] with [facility]; next appt is 06/04/2023.   OB History     Gravida  3   Para  3   Term  3   Preterm      AB      Living  3      SAB      IAB      Ectopic      Multiple  0   Live Births  3           Past Medical History:  Diagnosis Date   Anxiety    Depression    Migraine    SVT (supraventricular tachycardia) (HCC)    Vaginal Pap smear, abnormal 2019   HPV    Past Surgical History:  Procedure Laterality Date   NO PAST SURGERIES      Family History  Problem Relation Age of Onset   Anxiety disorder Mother    Hypertension Mother    Hypertension Father    Early death Sister    Asthma Brother    Diabetes Brother    Cancer Maternal Grandmother    Arthritis Maternal Grandmother    Leukemia Maternal Grandmother    Cancer Maternal Grandfather    Hypertension Paternal Grandmother     Social History   Tobacco Use   Smoking status: Never   Smokeless tobacco: Never  Vaping Use   Vaping status: Never Used  Substance Use Topics   Alcohol use: Not Currently   Drug use: Never    Allergies: No Known Allergies  Medications Prior to Admission  Medication Sig Dispense Refill Last Dose/Taking   acetaminophen (TYLENOL) 500 MG tablet Take 500 mg by mouth every 6 (six) hours as needed for moderate pain.   05/24/2023 at  2:00 PM   ibuprofen (ADVIL) 600 MG tablet Take 1 tablet  (600 mg total) by mouth every 6 (six) hours as needed. 30 tablet 0 05/24/2023 at 11:00 AM   Prenatal Vit-Fe Fumarate-FA (PRENATAL MULTIVITAMIN) TABS tablet Take 1 tablet by mouth daily at 12 noon.   05/24/2023   calcium carbonate (TUMS - DOSED IN MG ELEMENTAL CALCIUM) 500 MG chewable tablet Chew 3 tablets by mouth daily.      cetirizine (ZYRTEC ALLERGY) 10 MG tablet Take 1 tablet (10 mg total) by mouth daily. 30 tablet 2    fluticasone (FLONASE) 50 MCG/ACT nasal spray Place 2 sprays into both nostrils daily. 9.9 mL 2    simethicone (MYLICON) 125 MG chewable tablet Chew 125 mg by mouth every 6 (six) hours as needed for flatulence. Takes 1-2 as needed       Review of Systems  Constitutional: Negative.   HENT: Negative.    Eyes: Negative.   Respiratory: Negative.    Cardiovascular: Negative.   Gastrointestinal: Negative.   Endocrine: Negative.   Genitourinary: Negative.  Musculoskeletal: Negative.   Skin: Negative.   Allergic/Immunologic: Negative.   Neurological:  Positive for headaches (frontal and in both eyes).  Hematological: Negative.   Psychiatric/Behavioral: Negative.     Physical Exam   Blood pressure 124/84, pulse 74, temperature 98 F (36.7 C), temperature source Oral, resp. rate 16, SpO2 100%, currently breastfeeding.  Physical Exam Vitals and nursing note reviewed.  Constitutional:      Appearance: Normal appearance. She is obese.  Cardiovascular:     Rate and Rhythm: Normal rate.  Pulmonary:     Effort: Pulmonary effort is normal.  Genitourinary:    Comments: deferred Musculoskeletal:        General: Normal range of motion.  Skin:    General: Skin is warm and dry.  Neurological:     Mental Status: She is alert and oriented to person, place, and time.  Psychiatric:        Mood and Affect: Mood normal.        Behavior: Behavior normal.        Thought Content: Thought content normal.        Judgment: Judgment normal.     MAU Course   Procedures  MDM CCUA Excedrin Tension H/A 2 caplets --   Results for orders placed or performed during the hospital encounter of 05/24/23 (from the past 24 hours)  Urinalysis, Routine w reflex microscopic -Urine, Clean Catch     Status: Abnormal   Collection Time: 05/24/23  6:29 PM  Result Value Ref Range   Color, Urine YELLOW YELLOW   APPearance HAZY (A) CLEAR   Specific Gravity, Urine 1.016 1.005 - 1.030   pH 6.0 5.0 - 8.0   Glucose, UA NEGATIVE NEGATIVE mg/dL   Hgb urine dipstick LARGE (A) NEGATIVE   Bilirubin Urine NEGATIVE NEGATIVE   Ketones, ur NEGATIVE NEGATIVE mg/dL   Protein, ur NEGATIVE NEGATIVE mg/dL   Nitrite NEGATIVE NEGATIVE   Leukocytes,Ua MODERATE (A) NEGATIVE   RBC / HPF 6-10 0 - 5 RBC/hpf   WBC, UA 11-20 0 - 5 WBC/hpf   Bacteria, UA RARE (A) NONE SEEN   Squamous Epithelial / HPF 6-10 0 - 5 /HPF   Mucus PRESENT     Report given to and care assumed by Gerrit Heck, CNM @ 2105  Raelyn Mora, CNM 05/24/2023, 9:00 PM  Assessment and Plan  Reassessment (10:01 PM) -Nurse reports patient requesting discharge. -Orders placed. -Nurse to give precautions.  Cherre Robins MSN, CNM Advanced Practice Provider, [facility] [employer]

## 2023-05-25 ENCOUNTER — Inpatient Hospital Stay (HOSPITAL_COMMUNITY)
Admission: AD | Admit: 2023-05-25 | Discharge: 2023-05-25 | Disposition: A | Discharge status: Home / Self Care | Payer: Medicaid Other | Attending: Obstetrics and Gynecology | Admitting: Obstetrics and Gynecology

## 2023-05-25 ENCOUNTER — Encounter (HOSPITAL_COMMUNITY): Payer: Self-pay | Admitting: Obstetrics and Gynecology

## 2023-05-25 ENCOUNTER — Other Ambulatory Visit: Payer: Self-pay

## 2023-05-25 DIAGNOSIS — O165 Unspecified maternal hypertension, complicating the puerperium: Secondary | ICD-10-CM

## 2023-05-25 DIAGNOSIS — R519 Headache, unspecified: Secondary | ICD-10-CM | POA: Diagnosis present

## 2023-05-25 DIAGNOSIS — O9089 Other complications of the puerperium, not elsewhere classified: Secondary | ICD-10-CM | POA: Diagnosis present

## 2023-05-25 LAB — COMPREHENSIVE METABOLIC PANEL
ALT: 18 U/L (ref 0–44)
AST: 18 U/L (ref 15–41)
Albumin: 2.8 g/dL — ABNORMAL LOW (ref 3.5–5.0)
Alkaline Phosphatase: 67 U/L (ref 38–126)
Anion gap: 8 (ref 5–15)
BUN: 8 mg/dL (ref 6–20)
CO2: 25 mmol/L (ref 22–32)
Calcium: 8.8 mg/dL — ABNORMAL LOW (ref 8.9–10.3)
Chloride: 106 mmol/L (ref 98–111)
Creatinine, Ser: 0.81 mg/dL (ref 0.44–1.00)
GFR, Estimated: >60 mL/min (ref >60)
Glucose, Bld: 85 mg/dL (ref 70–99)
Potassium: 4.2 mmol/L (ref 3.5–5.1)
Sodium: 139 mmol/L (ref 135–145)
Total Bilirubin: 0.5 mg/dL (ref 0.0–1.2)
Total Protein: 6.3 g/dL — ABNORMAL LOW (ref 6.5–8.1)

## 2023-05-25 LAB — URINALYSIS, ROUTINE W REFLEX MICROSCOPIC
Bilirubin Urine: NEGATIVE
Glucose, UA: NEGATIVE mg/dL
Ketones, ur: NEGATIVE mg/dL
Nitrite: NEGATIVE
Protein, ur: NEGATIVE mg/dL
Specific Gravity, Urine: 1.009 (ref 1.005–1.030)
pH: 7 (ref 5.0–8.0)

## 2023-05-25 LAB — CBC
HCT: 34.3 % — ABNORMAL LOW (ref 36.0–46.0)
Hemoglobin: 11.4 g/dL — ABNORMAL LOW (ref 12.0–15.0)
MCH: 30.2 pg (ref 26.0–34.0)
MCHC: 33.2 g/dL (ref 30.0–36.0)
MCV: 90.7 fL (ref 80.0–100.0)
Platelets: 240 10*3/uL (ref 150–400)
RBC: 3.78 MIL/uL — ABNORMAL LOW (ref 3.87–5.11)
RDW: 13.4 % (ref 11.5–15.5)
WBC: 9.5 10*3/uL (ref 4.0–10.5)
nRBC: 0 % (ref 0.0–0.2)

## 2023-05-25 MED ORDER — ACETAMINOPHEN-CAFFEINE 500-65 MG PO TABS
2.0000 | ORAL_TABLET | Freq: Once | ORAL | Status: AC
  Administered 2023-05-25: 2 via ORAL
  Filled 2023-05-25: qty 2

## 2023-05-25 MED ORDER — FUROSEMIDE 20 MG PO TABS: 20.0000 mg | ORAL_TABLET | Freq: Every day | ORAL | 0 refills | Status: DC

## 2023-05-25 MED ORDER — NIFEDIPINE ER OSMOTIC RELEASE 30 MG PO TB24
30.0000 mg | ORAL_TABLET | Freq: Once | ORAL | Status: AC
  Administered 2023-05-25: 30 mg via ORAL
  Filled 2023-05-25: qty 1

## 2023-05-25 MED ORDER — NIFEDIPINE ER 30 MG PO TB24: 30.0000 mg | ORAL_TABLET | Freq: Every day | ORAL | 0 refills | Status: DC

## 2023-05-25 NOTE — MAU Note (Signed)
.  Lindsey Stevenson is a 28 y.o. at Unknown here in MAU reporting: took a nap and woke up with a HA around 1500 - went away with tylenol, but now having slight HA again. States she checked her BP - 160/103; she is having blurry vision and has a dull pain underneath right breast.  Onset of complaint: 2000 Pain score: 2 - HA; 2 - rib  Vitals:   05/25/23 2035  BP: (!) 138/96  Pulse: 71  Resp: 18  Temp: 98.2 F (36.8 C)  SpO2: 99%     FHT: pt is PP   Lab orders placed from triage: none

## 2023-05-25 NOTE — Discharge Instructions (Signed)
 Were seen in the maternity assessment unit for a headache and elevated blood pressures.  Your blood pressures were elevated while you were here.  We started you on a medication called Procardia that will help lower your blood pressure.  We have also started you on a medication called Lasix which will reduce the fluid in your overall body which will help with your blood pressure as well.  Please return to the maternity assessment unit if you have a headache that does not get better with Tylenol, right upper quadrant pain, sudden changes in your vision or any other concern.  We discussed the side effects of the medication which are minimal.  Some side effects of the Procardia can be dizziness with drops in your blood pressure, swelling in your lower extremities or headaches in the setting of normal blood pressures.  You should follow-up with your OB provider in the next week to check your blood pressure on the Procardia.  Medications that you have been prescribed are safe with breast-feeding.

## 2023-05-25 NOTE — MAU Provider Note (Signed)
 History     CSN: [id number]  Arrival date and time: 05/25/23 2013    Chief Complaint  Patient presents with   Hypertension   Headache   Hypertension Associated symptoms include headaches. Pertinent negatives include no chest pain or shortness of breath.  Headache  Pertinent negatives include no abdominal pain, back pain, coughing, dizziness, eye pain, fever, nausea, sore throat or vomiting. Her past medical history is significant for hypertension.    Lindsey Stevenson is a 28 year old G3P3 who is currently 6 days postpartum presents to the MAU reporting the headache that she rated as a 3 out of 10 and she took Tylenol and the headache resolved.  She then woke up from sleep and noted a headache that was 2 out of 10 and came here to the MAU.  She endorses some blurry vision.  She was seen on 22 February for similar complaint after receiving Excedrin tension.  Patient did check her blood pressures at home and reports they were in the 150s over 100s.  OB History     Gravida  3   Para  3   Term  3   Preterm      AB      Living  3      SAB      IAB      Ectopic      Multiple  0   Live Births  3           Past Medical History:  Diagnosis Date   Anxiety    Depression    Migraine    SVT (supraventricular tachycardia) (HCC)    Vaginal Pap smear, abnormal 2019   HPV    Past Surgical History:  Procedure Laterality Date   NO PAST SURGERIES      Family History  Problem Relation Age of Onset   Anxiety disorder Mother    Hypertension Mother    Hypertension Father    Early death Sister    Asthma Brother    Diabetes Brother    Cancer Maternal Grandmother    Arthritis Maternal Grandmother    Leukemia Maternal Grandmother    Cancer Maternal Grandfather    Hypertension Paternal Grandmother     Social History   Tobacco Use   Smoking status: Never   Smokeless tobacco: Never  Vaping Use   Vaping status: Never Used  Substance Use Topics   Alcohol use: Not  Currently   Drug use: Never    Allergies: No Known Allergies  No medications prior to admission.    Review of Systems  Constitutional:  Negative for chills and fever.  HENT:  Negative for congestion and sore throat.   Eyes:  Negative for pain and visual disturbance.  Respiratory:  Negative for cough, chest tightness and shortness of breath.   Cardiovascular:  Negative for chest pain.  Gastrointestinal:  Negative for abdominal pain, diarrhea, nausea and vomiting.  Endocrine: Negative for cold intolerance and heat intolerance.  Genitourinary:  Negative for dysuria and flank pain.  Musculoskeletal:  Negative for back pain.  Skin:  Negative for rash.  Allergic/Immunologic: Negative for food allergies.  Neurological:  Positive for headaches. Negative for dizziness and light-headedness.  Psychiatric/Behavioral:  Negative for agitation.    Physical Exam   Blood pressure (!) 133/95, pulse 66, temperature 98.2 F (36.8 C), temperature source Oral, resp. rate 18, height 5\' 7"  (1.702 m), weight 96.2 kg, SpO2 99%, currently breastfeeding.  Patient Vitals for the past 24 hrs:  BP Temp  Temp src Pulse Resp SpO2 Height Weight  05/25/23 2310 (!) 133/95 -- -- 66 -- -- -- --  05/25/23 2245 129/89 -- -- 71 -- -- -- --  05/25/23 2231 (!) 147/91 -- -- 75 -- -- -- --  05/25/23 2216 (!) 132/91 -- -- 70 -- -- -- --  05/25/23 2200 (!) 131/91 -- -- 66 -- -- -- --  05/25/23 2145 128/85 -- -- 70 -- -- -- --  05/25/23 2131 128/82 -- -- 74 -- -- -- --  05/25/23 2115 138/88 -- -- 77 -- -- -- --  05/25/23 2102 (!) 136/90 -- -- 77 -- -- -- --  05/25/23 2049 (!) 138/90 -- -- 76 -- -- -- --  05/25/23 2035 (!) 138/96 98.2 F (36.8 C) Oral 71 18 99 % 5\' 7"  (1.702 m) 96.2 kg   Physical Exam Vitals and nursing note reviewed.  Constitutional:      General: She is not in acute distress.    Appearance: She is well-developed.  HENT:     Head: Normocephalic and atraumatic.  Eyes:     General: No scleral  icterus.    Conjunctiva/sclera: Conjunctivae normal.  Cardiovascular:     Rate and Rhythm: Normal rate.  Pulmonary:     Effort: Pulmonary effort is normal.  Chest:     Chest wall: No tenderness.  Abdominal:     Palpations: Abdomen is soft.     Tenderness: There is no abdominal tenderness. There is no guarding or rebound.  Genitourinary:    Vagina: Normal.  Musculoskeletal:        General: Normal range of motion.     Cervical back: Normal range of motion and neck supple.  Skin:    General: Skin is warm and dry.     Findings: No rash.  Neurological:     Mental Status: She is alert and oriented to person, place, and time.     MAU Course  Procedures  MDM- high Ddx includes normal variant vs PP preeclampsia  Orders Placed This Encounter  Procedures   Urinalysis, Routine w reflex microscopic -Urine, Clean Catch   CBC   Comprehensive metabolic panel   Discharge patient    Medications: given excedrin tension. Procardia  10:15 PM HA improved. Labs reviewed and WNL 10:50 PM Discussed persistent mild range BPs with patient. Will treat with procardia in MAU and start procardia daily as well as lasix givne < 1 week pp. Reviewed with patient medication SE.   Dispo: discharge home  Assessment and Plan   1. Postpartum hypertension    - dc home, received procardia in MAU - Rx for procardia sent - Rx for lasix sent, will start tomorrow - BP followed within this week  Federico Flake 05/26/2023, 2:25 AM

## 2023-05-27 ENCOUNTER — Ambulatory Visit: Payer: BC Managed Care – PPO | Admitting: Neurology

## 2023-05-27 ENCOUNTER — Telehealth: Payer: Self-pay | Admitting: Neurology

## 2023-05-27 NOTE — Telephone Encounter (Signed)
 Pt said just had a baby, unable to come to appointment

## 2023-05-30 ENCOUNTER — Telehealth (HOSPITAL_COMMUNITY): Payer: Self-pay | Admitting: *Deleted

## 2023-05-30 NOTE — Telephone Encounter (Signed)
 05/30/2023  Name: Aleayah Chico MRN: [id number] DOB: Nov 04, 1995  Reason for Call:  Transition of Care Hospital Discharge Call  Contact Status: Patient Contact Status: Complete  Language assistant needed: Interpreter Mode: Interpreter Not Needed        Follow-Up Questions: Do You Have Any Concerns About Your Health As You Heal From Delivery?: No Do You Have Any Concerns About Your Infants Health?: No  Edinburgh Postnatal Depression Scale:  In the Past 7 Days: I have been able to laugh and see the funny side of things.: As much as I always could I have looked forward with enjoyment to things.: As much as I ever did I have blamed myself unnecessarily when things went wrong.: Yes, some of the time I have been anxious or worried for no good reason.: Yes, sometimes I have felt scared or panicky for no good reason.: No, not much Things have been getting on top of me.: No, most of the time I have coped quite well I have been so unhappy that I have had difficulty sleeping.: Not very often I have felt sad or miserable.: Not very often I have been so unhappy that I have been crying.: Only occasionally The thought of harming myself has occurred to me.: Never Edinburgh Postnatal Depression Scale Total: 9  PHQ2-9 Depression Scale:     Discharge Follow-up: Edinburgh score requires follow up?: No Patient was advised of the following resources:: Support Group, Breastfeeding Support Group  Post-discharge interventions: Reviewed Newborn Safe Sleep Practices  Salena Saner, RN 05/30/2023 14:18

## 2023-06-06 ENCOUNTER — Encounter (HOSPITAL_COMMUNITY): Payer: Self-pay | Admitting: Obstetrics and Gynecology

## 2023-06-06 ENCOUNTER — Inpatient Hospital Stay (HOSPITAL_COMMUNITY)
Admission: EM | Admit: 2023-06-06 | Discharge: 2023-06-06 | Disposition: A | Discharge status: Home / Self Care | Attending: Obstetrics and Gynecology | Admitting: Obstetrics and Gynecology

## 2023-06-06 DIAGNOSIS — R519 Headache, unspecified: Secondary | ICD-10-CM

## 2023-06-06 DIAGNOSIS — F419 Anxiety disorder, unspecified: Secondary | ICD-10-CM

## 2023-06-06 DIAGNOSIS — R419 Unspecified symptoms and signs involving cognitive functions and awareness: Secondary | ICD-10-CM | POA: Insufficient documentation

## 2023-06-06 DIAGNOSIS — O9089 Other complications of the puerperium, not elsewhere classified: Secondary | ICD-10-CM | POA: Diagnosis not present

## 2023-06-06 DIAGNOSIS — O165 Unspecified maternal hypertension, complicating the puerperium: Secondary | ICD-10-CM | POA: Diagnosis not present

## 2023-06-06 LAB — CBC
HCT: 43.3 % (ref 36.0–46.0)
Hemoglobin: 14.3 g/dL (ref 12.0–15.0)
MCH: 29.6 pg (ref 26.0–34.0)
MCHC: 33 g/dL (ref 30.0–36.0)
MCV: 89.6 fL (ref 80.0–100.0)
Platelets: 317 10*3/uL (ref 150–400)
RBC: 4.83 MIL/uL (ref 3.87–5.11)
RDW: 12.7 % (ref 11.5–15.5)
WBC: 9.8 10*3/uL (ref 4.0–10.5)
nRBC: 0 % (ref 0.0–0.2)

## 2023-06-06 LAB — COMPREHENSIVE METABOLIC PANEL
ALT: 18 U/L (ref 0–44)
AST: 21 U/L (ref 15–41)
Albumin: 3.9 g/dL (ref 3.5–5.0)
Alkaline Phosphatase: 61 U/L (ref 38–126)
Anion gap: 10 (ref 5–15)
BUN: 8 mg/dL (ref 6–20)
CO2: 22 mmol/L (ref 22–32)
Calcium: 9.2 mg/dL (ref 8.9–10.3)
Chloride: 108 mmol/L (ref 98–111)
Creatinine, Ser: 0.81 mg/dL (ref 0.44–1.00)
GFR, Estimated: >60 mL/min (ref >60)
Glucose, Bld: 74 mg/dL (ref 70–99)
Potassium: 4.2 mmol/L (ref 3.5–5.1)
Sodium: 140 mmol/L (ref 135–145)
Total Bilirubin: 0.7 mg/dL (ref 0.0–1.2)
Total Protein: 7.4 g/dL (ref 6.5–8.1)

## 2023-06-06 MED ORDER — LABETALOL HCL 5 MG/ML IV SOLN: 40.0000 mg | INTRAVENOUS | Status: DC | PRN

## 2023-06-06 MED ORDER — NIFEDIPINE 10 MG PO CAPS: 10.0000 mg | ORAL_CAPSULE | ORAL | Status: DC | PRN

## 2023-06-06 MED ORDER — NIFEDIPINE 10 MG PO CAPS: 20.0000 mg | ORAL_CAPSULE | ORAL | Status: DC | PRN

## 2023-06-06 MED ORDER — HYDROXYZINE HCL 25 MG PO TABS: 25.0000 mg | ORAL_TABLET | Freq: Four times a day (QID) | ORAL | 0 refills | Status: DC

## 2023-06-06 MED ORDER — HYDROXYZINE HCL 25 MG PO TABS
25.0000 mg | ORAL_TABLET | Freq: Once | ORAL | Status: AC
  Administered 2023-06-06: 25 mg via ORAL
  Filled 2023-06-06: qty 1

## 2023-06-06 NOTE — ED Triage Notes (Addendum)
 Pt BIB [facility] from home for sudden onset of sharp HA to posterior head. Hx of Migraines.  Pt is 2 weeks postpartum, vaginal delivery on 2/18. PT was seen here for preeclampsia on 2/22, 4 days after delivery.   180/50 on scene  128/85 99% RA, HR 84 CBG 100  18G R. AC

## 2023-06-06 NOTE — MAU Note (Signed)
.  Lindsey Stevenson is a 28 y.o. at Unknown here in MAU reporting transfer from main ED. Pt reports she delivered vaginally on 05/20/2023. Today pt reports she had sudden onset of pain in the back of her head. The pain was followed by nausea. Pt's husband called 911 and pt was transported to main ED due to report of elevated b/p and tachycardia.   Onset of complaint: 1230 today Pain score: 1/10 headache Vitals:   06/06/23 1308  BP: 111/72  Pulse: 74  Resp: 16  Temp: 98.1 F (36.7 C)  SpO2: 98%     FHT: na  Lab orders placed from triage

## 2023-06-06 NOTE — Discharge Instructions (Signed)

## 2023-06-06 NOTE — MAU Provider Note (Signed)
 Chief Complaint:  Hypertension   HPI    Lindsey Stevenson is a 28 y.o. G3P3003 at Unknown who presents to maternity admissions reporting she was at home today and experienced a sudden HA that she describes as postural and she called EMS/ transferred from ED.  She denies taking any medication for the HA and she is currently on Procardia for PP Hypertension. She denies any visual changes, RUQ pain, N/V/D and denies any fever or chills. She reported that she was also given lasix but did not take it due to concern for Lactation.   On presentation today she is normotensive with a resolved HA but she does admit to feelings of anxiety which may have contributed to her elevated BP via EMS.   Pregnancy Course: POSTPARTUM s/p NSVD 05/20/23 [facility] OB[facility]N  Past Medical History:  Diagnosis Date   Anxiety    Depression    Migraine    SVT (supraventricular tachycardia) (HCC)    Vaginal Pap smear, abnormal 2019   HPV   OB History  Gravida Para Term Preterm AB Living  3 3 3   3   SAB IAB Ectopic Multiple Live Births     0 3    # Outcome Date GA Lbr Len/2nd Weight Sex Type Anes PTL Lv  3 Term 05/20/23 [redacted]w[redacted]d / 00:04 3170 g F Vag-Spont EPI  LIV  2 Term 09/26/21 [redacted]w[redacted]d 04:10 / 00:10 3330 g M Vag-Spont EPI  LIV  1 Term 08/07/19 [redacted]w[redacted]d / 01:04 3445 g M Vag-Spont EPI  LIV   Past Surgical History:  Procedure Laterality Date   NO PAST SURGERIES     Family History  Problem Relation Age of Onset   Anxiety disorder Mother    Hypertension Mother    Hypertension Father    Early death Sister    Asthma Brother    Diabetes Brother    Cancer Maternal Grandmother    Arthritis Maternal Grandmother    Leukemia Maternal Grandmother    Cancer Maternal Grandfather    Hypertension Paternal Grandmother    Social History   Tobacco Use   Smoking status: Never   Smokeless tobacco: Never  Vaping Use   Vaping status: Never Used  Substance Use Topics   Alcohol use: Not Currently   Drug use: Never   No  Known Allergies Medications Prior to Admission  Medication Sig Dispense Refill Last Dose/Taking   NIFEdipine (ADALAT CC) 30 MG 24 hr tablet Take 1 tablet (30 mg total) by mouth daily. 30 tablet 0 06/06/2023   Prenatal Vit-Fe Fumarate-FA (PRENATAL MULTIVITAMIN) TABS tablet Take 1 tablet by mouth daily at 12 noon.   06/06/2023   acetaminophen (TYLENOL) 500 MG tablet Take 500 mg by mouth every 6 (six) hours as needed for moderate pain.      calcium carbonate (TUMS - DOSED IN MG ELEMENTAL CALCIUM) 500 MG chewable tablet Chew 3 tablets by mouth daily.      cetirizine (ZYRTEC ALLERGY) 10 MG tablet Take 1 tablet (10 mg total) by mouth daily. 30 tablet 2    fluticasone (FLONASE) 50 MCG/ACT nasal spray Place 2 sprays into both nostrils daily. 9.9 mL 2    furosemide (LASIX) 20 MG tablet Take 1 tablet (20 mg total) by mouth daily. Take medication in the morning due to side effect of increased urination 5 tablet 0    ibuprofen (ADVIL) 600 MG tablet Take 1 tablet (600 mg total) by mouth every 6 (six) hours as needed. 30 tablet 0  simethicone (MYLICON) 125 MG chewable tablet Chew 125 mg by mouth every 6 (six) hours as needed for flatulence. Takes 1-2 as needed       I have reviewed patient's Past Medical Hx, Surgical Hx, Family Hx, Social Hx, medications and allergies.   ROS  Pertinent items noted in HPI and remainder of comprehensive ROS otherwise negative.   PHYSICAL EXAM  Patient Vitals for the past 24 hrs:  BP Temp Temp src Pulse Resp SpO2  06/06/23 1605 -- -- -- -- -- 99 %  06/06/23 1600 117/72 -- -- 70 -- 99 %  06/06/23 1555 -- -- -- -- -- 100 %  06/06/23 1550 -- -- -- -- -- 100 %  06/06/23 1545 114/76 -- -- 73 -- --  06/06/23 1540 -- -- -- -- -- 99 %  06/06/23 1537 93/72 -- -- 94 -- --  06/06/23 1535 -- -- -- -- -- 100 %  06/06/23 1530 -- -- -- -- -- 99 %  06/06/23 1525 -- -- -- -- -- 99 %  06/06/23 1520 -- -- -- -- -- 99 %  06/06/23 1515 -- -- -- -- -- 100 %  06/06/23 1510 -- -- -- -- -- 99  %  06/06/23 1505 -- -- -- -- -- 99 %  06/06/23 1502 114/78 98.5 F (36.9 C) -- 72 15 99 %  06/06/23 1308 111/72 98.1 F (36.7 C) Oral 74 16 98 %    Constitutional: Well-developed, well-nourished female in no acute distress.  Cardiovascular: normal rate & rhythm, warm and well-perfused Respiratory: normal effort, no problems with respiration noted, Lungs BCTA GI: Abd soft, NT MS: Extremities nontender, no edema, normal ROM Neurologic: Alert and oriented x 4.  GU: no CVA tenderness Pelvic: Deferred ( Postpartum)       Labs: Results for orders placed or performed during the hospital encounter of 06/06/23 (from the past 24 hours)  Comprehensive metabolic panel     Status: None   Collection Time: 06/06/23  3:14 PM  Result Value Ref Range   Sodium 140 135 - 145 mmol/L   Potassium 4.2 3.5 - 5.1 mmol/L   Chloride 108 98 - 111 mmol/L   CO2 22 22 - 32 mmol/L   Glucose, Bld 74 70 - 99 mg/dL   BUN 8 6 - 20 mg/dL   Creatinine, Ser 1.61 0.44 - 1.00 mg/dL   Calcium 9.2 8.9 - 09.6 mg/dL   Total Protein 7.4 6.5 - 8.1 g/dL   Albumin 3.9 3.5 - 5.0 g/dL   AST 21 15 - 41 U/L   ALT 18 0 - 44 U/L   Alkaline Phosphatase 61 38 - 126 U/L   Total Bilirubin 0.7 0.0 - 1.2 mg/dL   GFR, Estimated >04 >54 mL/min   Anion gap 10 5 - 15  CBC     Status: None   Collection Time: 06/06/23  3:14 PM  Result Value Ref Range   WBC 9.8 4.0 - 10.5 K/uL   RBC 4.83 3.87 - 5.11 MIL/uL   Hemoglobin 14.3 12.0 - 15.0 g/dL   HCT 09.8 11.9 - 14.7 %   MCV 89.6 80.0 - 100.0 fL   MCH 29.6 26.0 - 34.0 pg   MCHC 33.0 30.0 - 36.0 g/dL   RDW 82.9 56.2 - 13.0 %   Platelets 317 150 - 400 K/uL   nRBC 0.0 0.0 - 0.2 %    Imaging:  No results found.  MDM & MAU COURSE  MDM:  HIGH  Postpartum State Labs ordered unremarkable  VSS  BP's Normotensive HA resolved spontaneously  No evidence of PP PreE with resolved symptoms    I have reviewed the patient chart and performed the physical exam . I have ordered &  interpreted the lab results . Medications ordered as stated below.  A/P as described below.  Counseling and education provided and patient agreeable  with plan as described below. Verbalized understanding.    MAU Course: Orders Placed This Encounter  Procedures   Comprehensive metabolic panel   CBC   Notify physician (specify) Confirmatory reading of BP> 160/110 15 minutes later   Apply Hypertensive Disorders of Pregnancy Care Plan   Vital signs   Measure blood pressure   Discharge patient Discharge disposition: 01-Home or Self Care; Discharge patient date: 06/06/2023   Meds ordered this encounter  Medications   AND Linked Order Group    NIFEdipine (PROCARDIA) capsule 10 mg    NIFEdipine (PROCARDIA) capsule 20 mg    NIFEdipine (PROCARDIA) capsule 20 mg    labetalol (NORMODYNE) injection 40 mg   hydrOXYzine (ATARAX) tablet 25 mg   hydrOXYzine (ATARAX) 25 MG tablet    Sig: Take 1 tablet (25 mg total) by mouth every 6 (six) hours.    Dispense:  12 tablet    Refill:  0    Supervising Provider:   Reva Bores [[id number]]    ASSESSMENT   1. Nonintractable headache, unspecified chronicity pattern, unspecified headache type   2. Anxiety   3. Postpartum hypertension   4. Postpartum state     PLAN  Discharge home in stable condition with return precautions.  Please see AVS for verbal and written instructions provided to the patient F/U with OB as scheduled on 06/27/23    Allergies as of 06/06/2023   No Known Allergies      Medication List     STOP taking these medications    furosemide 20 MG tablet Commonly known as: Lasix       TAKE these medications    acetaminophen 500 MG tablet Commonly known as: TYLENOL Take 500 mg by mouth every 6 (six) hours as needed for moderate pain.   calcium carbonate 500 MG chewable tablet Commonly known as: TUMS - dosed in mg elemental calcium Chew 3 tablets by mouth daily.   cetirizine 10 MG tablet Commonly known as: ZyrTEC  Allergy Take 1 tablet (10 mg total) by mouth daily.   fluticasone 50 MCG/ACT nasal spray Commonly known as: FLONASE Place 2 sprays into both nostrils daily.   hydrOXYzine 25 MG tablet Commonly known as: ATARAX Take 1 tablet (25 mg total) by mouth every 6 (six) hours.   ibuprofen 600 MG tablet Commonly known as: ADVIL Take 1 tablet (600 mg total) by mouth every 6 (six) hours as needed.   NIFEdipine 30 MG 24 hr tablet Commonly known as: ADALAT CC Take 1 tablet (30 mg total) by mouth daily.   prenatal multivitamin Tabs tablet Take 1 tablet by mouth daily at 12 noon.   simethicone 125 MG chewable tablet Commonly known as: MYLICON Chew 125 mg by mouth every 6 (six) hours as needed for flatulence. Takes 1-2 as needed       Marcell Barlow, MSN, [facility], [facility] [employer]

## 2023-06-06 NOTE — ED Provider Triage Note (Signed)
 Emergency Medicine Provider Triage Evaluation Note  Lindsey Stevenson , a 28 y.o. female  was evaluated in triage.  Pt complains of headache. Patient 2 weeks postpartum with hx of preeclampsia and on nifidipine  Reports headache posterior earlier today while pumping milk  EMS had BP 180 initially, subsequently BP 125 mmhg systolic on arrival  Patient reporting headache 1/10 now, very mild  Review of Systems  Positive: Headache Negative: LOC  Physical Exam  BP 111/72   Pulse 74   Temp 98.1 F (36.7 C) (Oral)   Resp 16   SpO2 98%  Gen:   Awake, no distress   Resp:  Normal effort  MSK:   Moves extremities without difficulty  No acute neurological deficits on exam   Medical Decision Making  Medically screening exam initiated at 1:16 PM.  Appropriate orders placed.  Montez Morita was informed that the remainder of the evaluation will be completed by another provider, this initial triage assessment does not replace that evaluation, and the importance of remaining in the ED until their evaluation is complete.  Pt evaluated for hypertensive urgency and preeclampsia.  Her blood pressure appears have normalized and her symptoms are nearly complete resolved.  I have a low suspicion for subarachnoid bleed or hemorrhage at this time.  I did speak to the MAU advised transferring the patient for evaluation at this time, which we will.   Terald Sleeper, MD 06/06/23 [phone]

## 2023-06-17 ENCOUNTER — Other Ambulatory Visit: Payer: Self-pay | Admitting: Family Medicine

## 2023-07-09 ENCOUNTER — Other Ambulatory Visit (INDEPENDENT_AMBULATORY_CARE_PROVIDER_SITE_OTHER): Payer: Self-pay | Admitting: Nurse Practitioner

## 2023-07-29 DIAGNOSIS — I471 Supraventricular tachycardia, unspecified: Secondary | ICD-10-CM

## 2023-08-01 ENCOUNTER — Ambulatory Visit: Attending: Cardiology | Admitting: Cardiology

## 2023-08-01 ENCOUNTER — Encounter: Payer: Self-pay | Admitting: Cardiology

## 2023-08-01 VITALS — BP 102/70 | HR 86 | Ht 67.0 in | Wt 180.2 lb

## 2023-08-01 DIAGNOSIS — I471 Supraventricular tachycardia, unspecified: Secondary | ICD-10-CM | POA: Diagnosis present

## 2023-08-01 MED ORDER — DILTIAZEM HCL 30 MG PO TABS: 30.0000 mg | ORAL_TABLET | ORAL | 1 refills | Status: DC | PRN

## 2023-08-01 NOTE — Patient Instructions (Signed)
 Medication Instructions:   START AS NEEDED: Diltiazem 30 mg   *If you need a refill on your cardiac medications before your next appointment, please call your pharmacy*  Testing/Procedures: Echo  Your physician has requested that you have an echocardiogram. Echocardiography is a painless test that uses sound waves to create images of your heart. It provides your doctor with information about the size and shape of your heart and how well your heart's chambers and valves are working. This procedure takes approximately one hour. There are no restrictions for this procedure. Please do NOT wear cologne, perfume, aftershave, or lotions (deodorant is allowed). Please arrive 15 minutes prior to your appointment time.  Please note: We ask at that you not bring children with you during ultrasound (echo/ vascular) testing. Due to room size and safety concerns, children are not allowed in the ultrasound rooms during exams. Our front office staff cannot provide observation of children in our lobby area while testing is being conducted. An adult accompanying a patient to their appointment will only be allowed in the ultrasound room at the discretion of the ultrasound technician under special circumstances. We apologize for any inconvenience.   30 day event monitor   Your physician has recommended that you wear an event monitor. Event monitors are medical devices that record the heart's electrical activity. Doctors most often us  these monitors to diagnose arrhythmias. Arrhythmias are problems with the speed or rhythm of the heartbeat. The monitor is a small, portable device. You can wear one while you do your normal daily activities. This is usually used to diagnose what is causing palpitations/syncope (passing out).   Follow-Up: At [facility], you and your health needs are our priority.  As part of our continuing mission to provide you with exceptional heart care, our providers are all part of one  team.  This team includes your primary Cardiologist (physician) and Advanced Practice Providers or APPs (Physician Assistants and Nurse Practitioners) who all work together to provide you with the care you need, when you need it.  Your next appointment:   3 month(s)  Provider:   Cody Das, MD    We recommend signing up for the patient portal called "MyChart".  Sign up information is provided on this After Visit Summary.  MyChart is used to connect with patients for Virtual Visits (Telemedicine).  Patients are able to view lab/test results, encounter notes, upcoming appointments, etc.  Non-urgent messages can be sent to your provider as well.   To learn more about what you can do with MyChart, go to [email or web address].

## 2023-08-01 NOTE — Progress Notes (Signed)
  Cardiology Office Note:  .   Date:  08/01/2023  ID:  Lindsey Stevenson, DOB 1995/06/19, MRN [id number] PCP: Alejandro Hurt, FNP  [facility] Providers Cardiologist:  Fransico Ivy, MD PCP: Alejandro Hurt, FNP  Chief Complaint  Patient presents with   PSVT     Lindsey Stevenson is a 28 y.o. 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 Amanda Jungling in 09/2022 when she was [redacted] weeks pregnant.  Due to infrequent nature of her symptoms without any high risk features, watchful management was recommended.  Patient is here today, accompanied by her 68-year-old son.  Patient also has a 30-month-old baby at home.  However, she has noticed increase in her palpitation episodes, presumably SVT.  Previously, episodes would last for more normal than a minute or so and would resolve with vagal maneuver.  However, recently, episodes have lasted for 3 minutes, not resolving with vagal maneuvers.  She does not drink any caffeine  or alcohol. She admits that stress and lack of sleep have played a part.     Vitals:   08/01/23 0903  BP: 102/70  Pulse: 86  SpO2: 96%      Review of Systems  Cardiovascular:  Positive for palpitations. Negative for chest pain, dyspnea on exertion, leg swelling and syncope.        Studies Reviewed: Aaron Aas       Independently interpreted 05/2023: Hb 14.3 Cr 0.81  Echocardiogram 03/2021: EF 60 to 65%.  No significant valvular abnormality. Normal echocardiogram.    Physical Exam Vitals and nursing note reviewed.  Constitutional:      General: She is not in acute distress. Neck:     Vascular: No JVD.  Cardiovascular:     Rate and Rhythm: Normal rate and regular rhythm.     Heart sounds: Normal heart sounds. No murmur heard. Pulmonary:     Effort: Pulmonary effort is normal.     Breath sounds: Normal breath sounds. No wheezing or rales.  Musculoskeletal:     Right lower leg: No edema.     Left lower leg: No edema.       VISIT DIAGNOSES:   ICD-10-CM   1. PSVT (paroxysmal supraventricular tachycardia) (HCC)  I47.10 ECHOCARDIOGRAM COMPLETE    Cardiac event monitor    CANCELED: Cardiac event monitor       Lindsey Stevenson is a 28 y.o. 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  Her symptomatology is certainly consistent with SVT, now occurring up to 3 minutes and spite of vagal maneuvers.  There is some associated shortness of breath with the symptoms.  Reviewed recent labs, there is no anemia to trigger tachycardia.  Last TSH was 0.4 in 12/2021.  Will repeat TSH now.  Recommend echocardiogram and repeat 30-day event monitor evaluation.  If SVT confirmed and not improved by vagal maneuvers and as needed diltiazem, she may need EP referral for consideration for ablation in future.      Meds ordered this encounter  Medications   diltiazem (CARDIZEM) 30 MG tablet    Sig: Take 1 tablet (30 mg total) by mouth as needed.    Dispense:  90 tablet    Refill:  1     F/u in 3 months  Signed, Cody Das, MD

## 2023-08-11 ENCOUNTER — Telehealth: Payer: Self-pay

## 2023-08-11 ENCOUNTER — Ambulatory Visit: Attending: Cardiology

## 2023-08-11 DIAGNOSIS — I471 Supraventricular tachycardia, unspecified: Secondary | ICD-10-CM

## 2023-08-11 NOTE — Telephone Encounter (Signed)
   Cardiac Monitor Alert  Date of alert:  08/11/2023   Patient Name: Lindsey Stevenson  DOB: [birth year]  MRN: [id number]   [facility] Cardiologist: Cody Das, MD  [facility] EP:  None    Monitor Information: Cardiac Event Monitor [[employer]]  Reason:  PSVT (paroxysmal supraventricular tachycardia)  Ordering provider:  Fransico Ivy, MD   Alert Supraventricular Tachycardia - fastest HR:  199 Sinus Tachycardia This is the 1st alert for this rhythm.   Next Cardiology Appointment   Date:  11/06/2023  Provider:  Fransico Ivy, MD  The patient was contacted today.  She is symptomatic.  She reports the following symptoms: palpitations. Denies any other symptoms.  Arrhythmia, symptoms and history reviewed with Dr. Filiberto Hug.   Plan:  Refer to EP d/t pt still having symptoms with Diltiazem  30 mg daily.  Pt told to contact our office with any concerns or new symptoms she experiences. Pt verbalized understanding of plan.   Roxianne Coral, [place]  08/11/2023 10:08 AM

## 2023-08-11 NOTE — Telephone Encounter (Signed)
   Cardiac Monitor Alert  Date of alert:  08/11/2023   Patient Name: Lindsey Stevenson  DOB: 1995/08/18  MRN: [id number]   [facility] Cardiologist: Cody Das, MD  [facility] EP:  None    Monitor Information: Cardiac Event Monitor [[employer]]  Reason:  PSVT (paroxysmal supraventricular tachycardia)  Ordering provider:  Fransico Ivy, MD   Alert Supraventricular Tachycardia - fastest HR:  172 This is the 2nd alert for this rhythm.   Next Cardiology Appointment   Date:  11/06/2023  Provider:  Fransico Ivy, MD  The patient was contacted today.  She is symptomatic.  She reports the following symptoms:  palpitations. Arrhythmia, symptoms and history reviewed with Dr. Filiberto Hug.   Plan:  Refer to EP. Order already placed. Pt is aware.   Roxianne Coral, [place]  08/11/2023 10:35 AM

## 2023-08-13 ENCOUNTER — Telehealth: Payer: Self-pay

## 2023-08-13 NOTE — Telephone Encounter (Signed)
   Cardiac Monitor Alert  Date of alert:  08/13/2023   Patient Name: Lindsey Stevenson  DOB: 1995/05/15  MRN: [id number]   [facility] Cardiologist: Cody Das, MD   [facility] EP:  None    Monitor Information: Cardiac Event Monitor [[employer]]  Reason:  SVT, palpitations Ordering provider:  Dr. Filiberto Hug  Alert Atrial Fibrillation/Flutter This is the 1st alert for this rhythm.  The patient has no hx of Atrial Fibrillation/Flutter.  The patient is not currently on anticoagulation.  Next Cardiology Appointment   Date:  11/06/23 at 9:40 AM  Provider:  Dr. Filiberto Hug  The patient was contacted today.  She is asymptomatic. Arrhythmia, symptoms and history reviewed with Dr. Addie Holstein (DOD).  Plan:  Per Dr. Addie Holstein: looks more like a-fib or paroxysmal atrial tachycardia. Asymptomatic, no changes at this time.   Monitor alert report sent to be scanned into chart.  Luwana Salvo, RN  08/13/2023 8:34 AM

## 2023-08-13 NOTE — Telephone Encounter (Signed)
 I did not see scanned event from today.  Previous to scant events lasting for 1 minute and 22 seconds likely to be SVT/paroxysmal atrial tachycardia, although A-fib cannot be excluded as there is some irregularity.  Continue monitoring.  Continue vagal maneuvers and as needed diltiazem .  Patient should also have an EP evaluation as previously recommended.  Thanks MJP

## 2023-08-14 NOTE — Telephone Encounter (Signed)
 Thank you so much

## 2023-08-18 ENCOUNTER — Telehealth: Payer: Self-pay | Admitting: Cardiology

## 2023-08-18 NOTE — Telephone Encounter (Signed)
 Will forward this call to our monitor techs/dept for further management, and to assist the pt with monitor/rash concerns.

## 2023-08-18 NOTE — Telephone Encounter (Signed)
 Instructed patient to contact [employer].  They offer 2 different sensitive skin products.  It will be a couple days before she receives them which, will allow time for her skin to heal. She could try the Hydrocolloid strip first.  If that doesn't work for her, she can have them send her a bridge and sensitive skin electrodes. Patient instructed to change location of strip for vertical to horizontal every time she changes it. Patient was informed that the monitor bills out as a "30 day cardiac event monitor".  The cost would be the same whether she wears is 3 days or 30.  We would like to get as much data as possible, so please try sensitive skin alternatives.

## 2023-08-18 NOTE — Telephone Encounter (Signed)
 Pt called in stating her heart monitor is starting to give her a rash and she wants to know if she can take it off earlier. She stated she was supposed to wear it for 30 days. Please advise.

## 2023-09-01 ENCOUNTER — Ambulatory Visit: Attending: Cardiovascular Disease | Admitting: Cardiovascular Disease

## 2023-09-01 ENCOUNTER — Encounter: Payer: Self-pay | Admitting: Cardiovascular Disease

## 2023-09-01 VITALS — BP 120/78 | HR 71 | Ht 67.0 in | Wt 180.0 lb

## 2023-09-01 DIAGNOSIS — I471 Supraventricular tachycardia, unspecified: Secondary | ICD-10-CM | POA: Insufficient documentation

## 2023-09-01 NOTE — Patient Instructions (Signed)
 Medication Instructions:  Your physician recommends that you continue on your current medications as directed. Please refer to the Current Medication list given to you today. *If you need a refill on your cardiac medications before your next appointment, please call your pharmacy*  Testing/Procedures: EP Study with possible SVT Ablation - please contact our office when you're ready to schedule  Your physician has recommended that you have an ablation. Catheter ablation is a medical procedure used to treat some cardiac arrhythmias (irregular heartbeats). During catheter ablation, a long, thin, flexible tube is put into a blood vessel in your groin (upper thigh), or neck. This tube is called an ablation catheter. It is then guided to your heart through the blood vessel. Radio frequency waves destroy small areas of heart tissue where abnormal heartbeats may cause an arrhythmia to start. Please see the instruction sheet given to you today.   Follow-Up: At [facility], you and your health needs are our priority.  As part of our continuing mission to provide you with exceptional heart care, our providers are all part of one team.  This team includes your primary Cardiologist (physician) and Advanced Practice Providers or APPs (Physician Assistants and Nurse Practitioners) who all work together to provide you with the care you need, when you need it.  Your next appointment:   Please contact our office when you are ready to schedule   Provider:   Marlane Silver, MD

## 2023-09-01 NOTE — Progress Notes (Signed)
 Electrophysiology Office Note:    Date:  09/01/2023   ID:  Lindsey Stevenson, DOB [birth year], MRN [id number]  PCP:  Alejandro Hurt, FNP   [facility] Providers Cardiologist:  Cody Das, MD     Referring MD: Cody Das, MD   History of Present Illness:    Lindsey Stevenson is a 28 y.o. female with a medical history significant for SVT, referred for arrhythmia management.       Discussed the use of AI scribe software for clinical note transcription with the patient, who gave verbal consent to proceed.  History of Present Illness Lindsey Stevenson is a 28 year old female who presents with recurrent episodes of neurocomplex tachycardia.  She experiences episodes of neurocomplex tachycardia with heart rates reaching up to 200 beats per minute, characterized by a 'warm up' and 'cool off' pattern. These episodes began during her second pregnancy, with one significant episode lasting over 20 minutes, prompting her to call EMS. During her third pregnancy, she experienced occasional episodes, but they were not concerning.  After the birth of her most recent child, she noticed an increase in the frequency of these episodes. She attempted vagal maneuvers to manage the episodes, but they were ineffective. She identifies lack of sleep and caffeine  as potential triggers, but notes that the recent episodes were different as she was unable to terminate them with her usual methods.  In December 2022, she was informed of a left ventricular ejection fraction (LVEF) of 60-65% and a moderately dilated left atrium. She is currently taking magnesium and zinc supplements.  She has three young children: a three-month-old, a four-year-old, and a soon-to-be two-year-old. No prolonged episodes beyond 20 minutes. No chest pain or syncope.         Today, she is at baseline and feels well. No complaints.  EKGs/Labs/Other Studies Reviewed Today:      Echocardiogram:  TTE December 2022 LVEF 60 to 65%.  Moderately dilated left atrium.   Monitors:  30 day monitor 07/2022  -- my interpretation Episodes of rapid, narrow complex tachycardia were detected.  There is a "warm up" and a "cool off" to onset and termination.    EKG:   EKG Interpretation Date/Time:  Monday September 01 2023 14:50:52 EDT Ventricular Rate:  71 PR Interval:  178 QRS Duration:  94 QT Interval:  382 QTC Calculation: 415 R Axis:   87  Text Interpretation: Normal sinus rhythm Normal ECG When compared with ECG of 24-Oct-2022 16:17, No significant change was found Confirmed by Marlane Silver [phone]) on 09/01/2023 2:55:13 PM     Physical Exam:    VS:  BP 120/78 (BP Location: Right Arm, Patient Position: Sitting, Cuff Size: Large)   Pulse 71   Ht 5\' 7"  (1.702 m)   Wt 180 lb (81.6 kg)   SpO2 98%   BMI 28.19 kg/m     Wt Readings from Last 3 Encounters:  09/01/23 180 lb (81.6 kg)  08/01/23 180 lb 3.2 oz (81.7 kg)  05/25/23 212 lb 1.6 oz (96.2 kg)     GEN:  Well nourished, well developed in no acute distress CARDIAC: RRR, no murmurs, rubs, gallops RESPIRATORY:  Normal work of breathing MUSCULOSKELETAL: no edema    ASSESSMENT & PLAN:     SVT Symptomatic with palpitations Rates are very high, up to about 200 bpm We discussed management options. I recommended EP study and ablation, and she is going to discuss with her family.  We discussed  the indication, rationale, logistics, anticipated benefits, and potential risks of the ablation procedure including but not limited to -- bleed at the groin access site, chest pain, damage to nearby organs such as the diaphragm, lungs, or esophagus, need for a drainage tube, or prolonged hospitalization. I explained that the risk for stroke, heart attack, need for open chest surgery, or even death is very low but not zero. she  expressed understanding and wishes to proceed.     Signed, Efraim Grange, MD   09/01/2023 3:09 PM    [facility]

## 2023-09-08 ENCOUNTER — Ambulatory Visit (HOSPITAL_COMMUNITY)
Admission: RE | Admit: 2023-09-08 | Discharge: 2023-09-08 | Disposition: A | Discharge status: Home / Self Care | Source: Ambulatory Visit | Attending: Cardiovascular Disease | Admitting: Cardiovascular Disease

## 2023-09-08 ENCOUNTER — Ambulatory Visit: Payer: Self-pay | Admitting: Cardiology

## 2023-09-08 DIAGNOSIS — I361 Nonrheumatic tricuspid (valve) insufficiency: Secondary | ICD-10-CM | POA: Diagnosis not present

## 2023-09-08 DIAGNOSIS — I471 Supraventricular tachycardia, unspecified: Secondary | ICD-10-CM | POA: Diagnosis not present

## 2023-09-08 LAB — ECHOCARDIOGRAM COMPLETE
Area-P 1/2: 2.67 cm2
S' Lateral: 2.8 cm

## 2023-09-09 DIAGNOSIS — R002 Palpitations: Secondary | ICD-10-CM

## 2023-09-09 DIAGNOSIS — I471 Supraventricular tachycardia, unspecified: Secondary | ICD-10-CM

## 2023-09-09 LAB — TSH: TSH: 0.286 u[IU]/mL — ABNORMAL LOW (ref 0.450–4.500)

## 2023-09-09 NOTE — Progress Notes (Signed)
 TSH is low. Lets check Free T4 to see if thyroid  function is overactive.  Thanks MJP

## 2023-10-11 LAB — T4, FREE: Free T4: 1.32 ng/dL (ref 0.82–1.77)

## 2023-11-06 ENCOUNTER — Encounter: Payer: Self-pay | Admitting: Cardiology

## 2023-11-06 ENCOUNTER — Ambulatory Visit: Attending: Cardiology | Admitting: Cardiology

## 2023-11-06 ENCOUNTER — Telehealth (HOSPITAL_COMMUNITY): Payer: Self-pay | Admitting: *Deleted

## 2023-11-06 VITALS — BP 106/62 | HR 81 | Ht 67.0 in | Wt 178.8 lb

## 2023-11-06 DIAGNOSIS — R072 Precordial pain: Secondary | ICD-10-CM | POA: Insufficient documentation

## 2023-11-06 DIAGNOSIS — I471 Supraventricular tachycardia, unspecified: Secondary | ICD-10-CM | POA: Insufficient documentation

## 2023-11-06 NOTE — Patient Instructions (Signed)
 Testing/Procedures: Exercise Stress Test   Exercise Tolerance Test  Please arrive 15 minutes prior to your appointment time for registration and insurance purposes.  The test will take approximately 45 minutes to complete.  How to prepare for your Exercise Stress Test: Do bring a list of your current medications with you.  If not listed below, you may take your medications as normal. Do wear comfortable clothes (no dresses or overalls) and walking shoes, tennis shoes preferred (no heels or open toed shoes are allowed) Do Not wear cologne, perfume, aftershave or lotions (deodorant is allowed). Please report to [address], [address] for your test.  If these instructions are not followed, your test will have to be rescheduled.  If you have questions or concerns about your appointment, you can call the Stress Lab at [phone].  If you cannot keep your appointment, please provide 24 hours notification to the Stress Lab, to avoid a possible $50 charge to your account.   Follow-Up: At [facility], you and your health needs are our priority.  As part of our continuing mission to provide you with exceptional heart care, our providers are all part of one team.  This team includes your primary Cardiologist (physician) and Advanced Practice Providers or APPs (Physician Assistants and Nurse Practitioners) who all work together to provide you with the care you need, when you need it.  Your next appointment:   6 month(s)  Provider:   Newman JINNY Lawrence, MD

## 2023-11-06 NOTE — Telephone Encounter (Signed)
 Left instructions for ETT on vm per DPR.

## 2023-11-06 NOTE — Progress Notes (Signed)
  Cardiology Office Note:  .   Date:  11/06/2023  ID:  Lindsey Stevenson, DOB 10/20/1995, MRN [id number] PCP: Marvene Prentice SAUNDERS, FNP  [facility] Providers Cardiologist:  Newman Lawrence, MD PCP: Marvene Prentice SAUNDERS, FNP  Chief Complaint  Patient presents with   SVT     Lindsey Stevenson is a 28 y.o. 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 Nancey in 08/2023, was recommended ablation for SVT, patient is still thinking about this. She has had occasional chest pain at rest, not related to exertion. Palpitations still happen from time to time.   Vitals:   11/06/23 1004  BP: 106/62  Pulse: 81  SpO2: 98%       Review of Systems  Cardiovascular:  Positive for chest pain and palpitations. Negative for dyspnea on exertion, leg swelling and syncope.        Studies Reviewed: SABRA       Labs 05/2023: Hb 14.3 Cr 0.81  Mobile cardiac telemetry 30 days 08/08/2023 - 09/06/2023: Dominant rhythm: Sinus. HR 56-177 bpm. Avg HR 79 bpm. 9 manually detected events, correlate with SVT. 1% SVE, <1% VE noted.  Echocardiogram 08/2023:  1. Left ventricular ejection fraction, by estimation, is 60 to 65%. The  left ventricle has normal function. The left ventricle has no regional  wall motion abnormalities. Left ventricular diastolic parameters were  normal.   2. Right ventricular systolic function is normal. The right ventricular  size is normal.   3. The mitral valve is normal in structure. Trivial mitral valve  regurgitation. No evidence of mitral stenosis.   4. The aortic valve is tricuspid. Aortic valve regurgitation is not  visualized. No aortic stenosis is present.   5. The inferior vena cava is normal in size with greater than 50%  respiratory variability, suggesting right atrial pressure of 3 mmHg.    Physical Exam Vitals and nursing note reviewed.  Constitutional:      General: She is not in acute distress. Neck:     Vascular: No JVD.   Cardiovascular:     Rate and Rhythm: Normal rate and regular rhythm.     Heart sounds: Normal heart sounds. No murmur heard. Pulmonary:     Effort: Pulmonary effort is normal.     Breath sounds: Normal breath sounds. No wheezing or rales.  Musculoskeletal:     Right lower leg: No edema.     Left lower leg: No edema.      VISIT DIAGNOSES:   ICD-10-CM   1. SVT (supraventricular tachycardia) (HCC)  I47.10     2. Precordial pain  R07.2 Exercise Tolerance Test        Lindsey Stevenson is a 28 y.o. 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 F/u w/Dr. Nancey re: discussion about ablation.  Precordial pain: Unlikely to be cardiac. Recommend exercise treadmill stress test, will also look for any exercise induced arrhythmia.   F/u in 6 months  Signed, Newman JINNY Lawrence, MD

## 2023-11-13 ENCOUNTER — Ambulatory Visit (HOSPITAL_COMMUNITY)
Admission: RE | Admit: 2023-11-13 | Discharge: 2023-11-13 | Disposition: A | Discharge status: Home / Self Care | Source: Ambulatory Visit | Attending: Cardiology | Admitting: Cardiology

## 2023-11-13 DIAGNOSIS — R072 Precordial pain: Secondary | ICD-10-CM | POA: Diagnosis present

## 2023-11-13 LAB — EXERCISE TOLERANCE TEST
Angina Index: 0
Duke Treadmill Score: 7
Estimated workload: 8.6
Exercise duration (min): 7 min
Exercise duration (sec): 6 s
MPHR: 193 {beats}/min
Peak HR: 179 {beats}/min
Percent HR: 92 %
Rest HR: 88 {beats}/min
ST Depression (mm): 0 mm

## 2023-11-14 ENCOUNTER — Ambulatory Visit: Payer: Self-pay | Admitting: Cardiology

## 2023-12-10 ENCOUNTER — Encounter: Payer: Self-pay | Admitting: Neurology

## 2023-12-10 ENCOUNTER — Ambulatory Visit (INDEPENDENT_AMBULATORY_CARE_PROVIDER_SITE_OTHER): Payer: BC Managed Care – PPO | Admitting: Neurology

## 2023-12-10 VITALS — BP 100/70 | HR 77 | Wt 179.0 lb

## 2023-12-10 DIAGNOSIS — G43E09 Chronic migraine with aura, not intractable, without status migrainosus: Secondary | ICD-10-CM | POA: Diagnosis not present

## 2023-12-10 DIAGNOSIS — G478 Other sleep disorders: Secondary | ICD-10-CM

## 2023-12-10 DIAGNOSIS — R200 Anesthesia of skin: Secondary | ICD-10-CM

## 2023-12-10 NOTE — Progress Notes (Signed)
 [facility]  PATIENT: Lindsey Stevenson DOB: October 02, 1995  REFERRING DOCTOR OR PCP: Prentice Batch FNP SOURCE: Patient, notes from primary care, imaging and lab reports, MRI personally reviewed.  _________________________________   HISTORICAL  CHIEF COMPLAINT:  Chief Complaint  Patient presents with   Follow-up    RM 10 with to children.  Feels stable, no new sx.    HISTORY OF PRESENT ILLNESS:  Lindsey Stevenson is a 28 y.o. woman with numbness and other symptoms.    UPDATE 12/10/2023: Her numbness has resolved since starting an SSRI for anxiety.    During pregnancy, she had numbness and tingling in the legs, right > left. She had dysesthesias with allodynia - touch felt prickly.      She is now having migraines just 2-3 days a moth usually associated with her cycle.   She takes Tylenol  with some benefit.   She has had sleep paralysis since 2021.  These have only occurred  once since her daughter born in February 2025.   They were occurring 3 times a week    She denies hypersomnia.   She has some vivid dreams but not too frequently.  She does not have cataplexy.      Imaging: MRI of 11/23/2017 was normal. Incidental note of mild mucoperiosteal thickening in the right hemisphenoid sinus.   REVIEW OF SYSTEMS: Constitutional: No fevers, chills, sweats, or change in appetite Eyes: No visual changes, double vision, eye pain Ear, nose and throat: No hearing loss, ear pain, nasal congestion, sore throat Cardiovascular: No chest pain, palpitations Respiratory:  No shortness of breath at rest or with exertion.   No wheezes GastrointestinaI: No nausea, vomiting, diarrhea, abdominal pain, fecal incontinence Genitourinary:  No dysuria, urinary retention or frequency.  No nocturia. Musculoskeletal:  No neck pain, back pain Integumentary: No rash, pruritus, skin lesions Neurological: as above Psychiatric: No depression at this time.  No anxiety Endocrine: No  palpitations, diaphoresis, change in appetite, change in weigh or increased thirst Hematologic/Lymphatic:  No anemia, purpura, petechiae. Allergic/Immunologic: No itchy/runny eyes, nasal congestion, recent allergic reactions, rashes  ALLERGIES: No Known Allergies  HOME MEDICATIONS:  Current Outpatient Medications:    acetaminophen  (TYLENOL ) 500 MG tablet, Take 500 mg by mouth every 6 (six) hours as needed for moderate pain., Disp: , Rfl:    LEXAPRO 10 MG tablet, , Disp: , Rfl:   PAST MEDICAL HISTORY: Past Medical History:  Diagnosis Date   Anxiety    Depression    Migraine    SVT (supraventricular tachycardia) (HCC)    Vaginal Pap smear, abnormal 2019   HPV    PAST SURGICAL HISTORY: Past Surgical History:  Procedure Laterality Date   NO PAST SURGERIES      FAMILY HISTORY: Family History  Problem Relation Age of Onset   Anxiety disorder Mother    Hypertension Mother    Hypertension Father    Early death Sister    Asthma Brother    Diabetes Brother    Cancer Maternal Grandmother    Arthritis Maternal Grandmother    Leukemia Maternal Grandmother    Cancer Maternal Grandfather    Hypertension Paternal Grandmother     SOCIAL HISTORY: Social History   Socioeconomic History   Marital status: Married    Spouse name: Emeline   Number of children: 3   Years of education: Not on file   Highest education level: Not on file  Occupational History   Not on file  Tobacco Use   Smoking  status: Never   Smokeless tobacco: Never  Vaping Use   Vaping status: Never Used  Substance and Sexual Activity   Alcohol use: Not Currently   Drug use: Never   Sexual activity: Not Currently    Birth control/protection: None  Other Topics Concern   Not on file  Social History Narrative   Right Handed    No Caffeine     Social Drivers of Corporate investment banker Strain: Not on file  Food Insecurity: No Food Insecurity (05/20/2023)   Hunger Vital Sign    Worried About Running  Out of Food in the Last Year: Never true    Ran Out of Food in the Last Year: Never true  Transportation Needs: No Transportation Needs (05/20/2023)   PRAPARE - Administrator, Civil Service (Medical): No    Lack of Transportation (Non-Medical): No  Physical Activity: Not on file  Stress: Not on file  Social Connections: Not on file  Intimate Partner Violence: Not At Risk (05/20/2023)   Humiliation, Afraid, Rape, and Kick questionnaire    Fear of Current or Ex-Partner: No    Emotionally Abused: No    Physically Abused: No    Sexually Abused: No       PHYSICAL EXAM  Vitals:   12/10/23 0832  BP: 100/70  Pulse: 77  SpO2: 97%  Weight: 179 lb (81.2 kg)    Body mass index is 28.04 kg/m.   General: The patient is well-developed and well-nourished and in no acute distress  HEENT:  Head is [place]/AT.  Sclera are anicteric.   Skin: Extremities are without rash or  edema.  Neurologic Exam  Mental status: The patient is alert and oriented x 3 at the time of the examination. The patient has apparent normal recent and remote memory, with an apparently normal attention span and concentration ability.   Speech is normal.  Cranial nerves: Extraocular movements are full.  Facial symmetry is present. There is good facial sensation to soft touch bilaterally.Facial strength is normal.  Trapezius and sternocleidomastoid strength is normal. No dysarthria is noted.   No obvious hearing deficits are noted.  Motor:  Muscle bulk is normal.   Tone is normal. Strength is  5 / 5 in all 4 extremities.   Sensory: Sensory testing is intact to pinprick, soft touch and vibration sensation in all 4 extremities.  Coordination: Cerebellar testing reveals good finger-nose-finger and heel-to-shin bilaterally.  Gait and station: Station is normal.   Gait is normal.    Reflexes: Deep tendon reflexes are symmetric and normal bilaterally.       DIAGNOSTIC DATA (LABS, IMAGING, TESTING) - I  reviewed patient records, labs, notes, testing and imaging myself where available.  Lab Results  Component Value Date   WBC 9.8 06/06/2023   HGB 14.3 06/06/2023   HCT 43.3 06/06/2023   MCV 89.6 06/06/2023   PLT 317 06/06/2023      Component Value Date/Time   NA 140 06/06/2023 1514   K 4.2 06/06/2023 1514   CL 108 06/06/2023 1514   CO2 22 06/06/2023 1514   GLUCOSE 74 06/06/2023 1514   BUN 8 06/06/2023 1514   CREATININE 0.81 06/06/2023 1514   CALCIUM 9.2 06/06/2023 1514   PROT 7.4 06/06/2023 1514   ALBUMIN 3.9 06/06/2023 1514   AST 21 06/06/2023 1514   ALT 18 06/06/2023 1514   ALKPHOS 61 06/06/2023 1514   BILITOT 0.7 06/06/2023 1514   GFRNONAA >60 06/06/2023 1514   GFRAA >  60 07/29/2019 1637    Lab Results  Component Value Date   TSH 0.286 (L) 09/08/2023       ASSESSMENT AND PLAN  Numbness  Chronic migraine with aura without status migrainosus, not intractable  Sleep paralysis   She will continue Lexapro We discussed that if migraine or sleep paralysis worsens, we could add protriptyline or nortriptyline qHS Rtc prn.  Contact us  if new or worsening issues   Jesiah Yerby A. Vear, MD, [facility] 12/10/2023, 9:02 AM Certified in Neurology, Clinical Neurophysiology, Sleep Medicine and Neuroimaging  [facility] [address], [address] [place], [place] [address] [phone]

## 2023-12-18 IMAGING — US US BREAST*R* LIMITED INC AXILLA
1 series · 8 of 8 positions shown · non-contrast
Comparison: None Available.

CLINICAL DATA: Palpable mass in the right axilla. She is 39 weeks
pregnant. Family history of breast cancer in her maternal
grandmother and maternal great aunt.

EXAM:
ULTRASOUND OF THE RIGHT BREAST

[Series 1: us breast*right* limited inc axilla · 0.06mm/px · 8 of 8 slices shown]
[im 1/8]
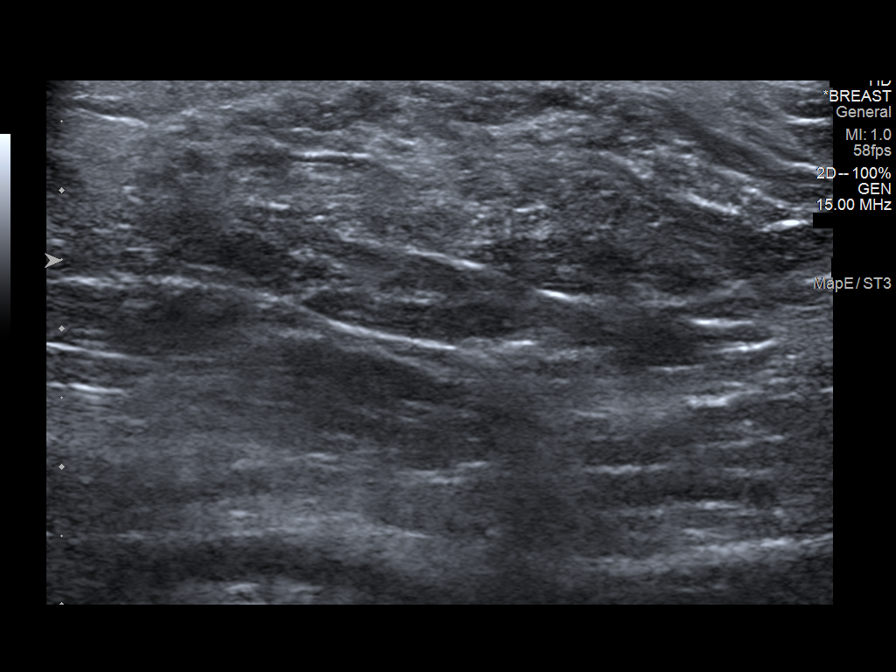
[im 2/8]
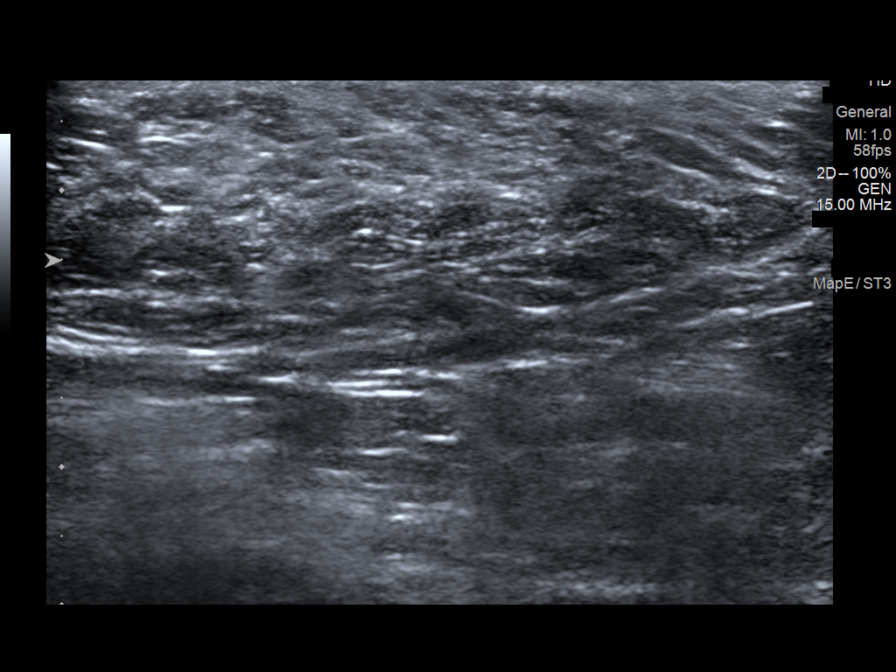
[im 3/8]
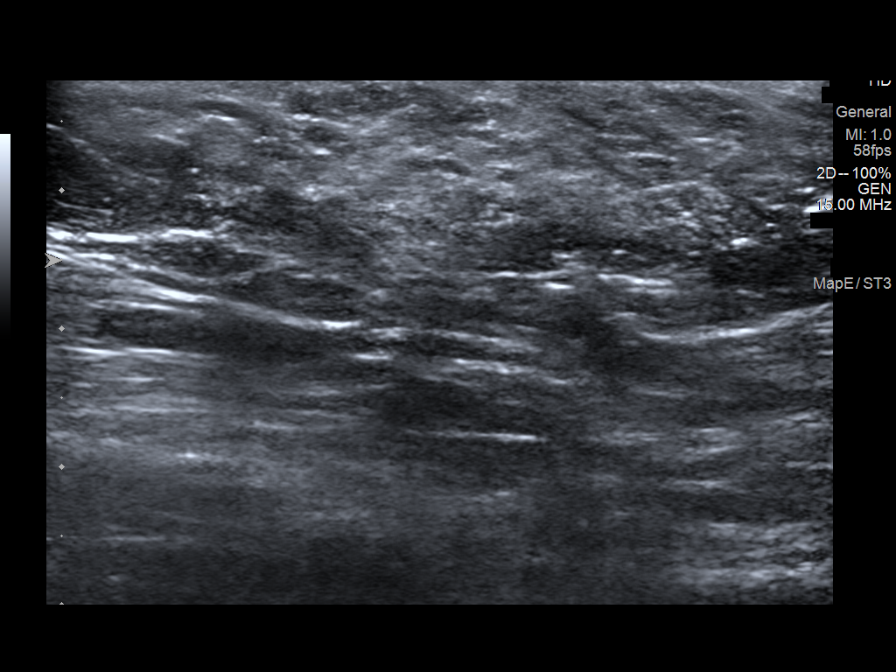
[im 4/8]
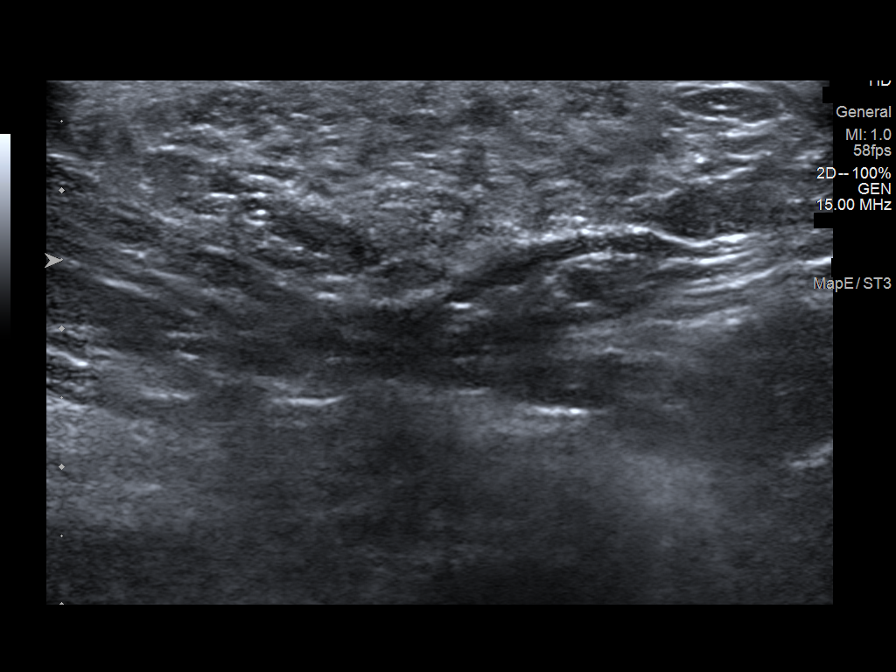
[im 5/8]
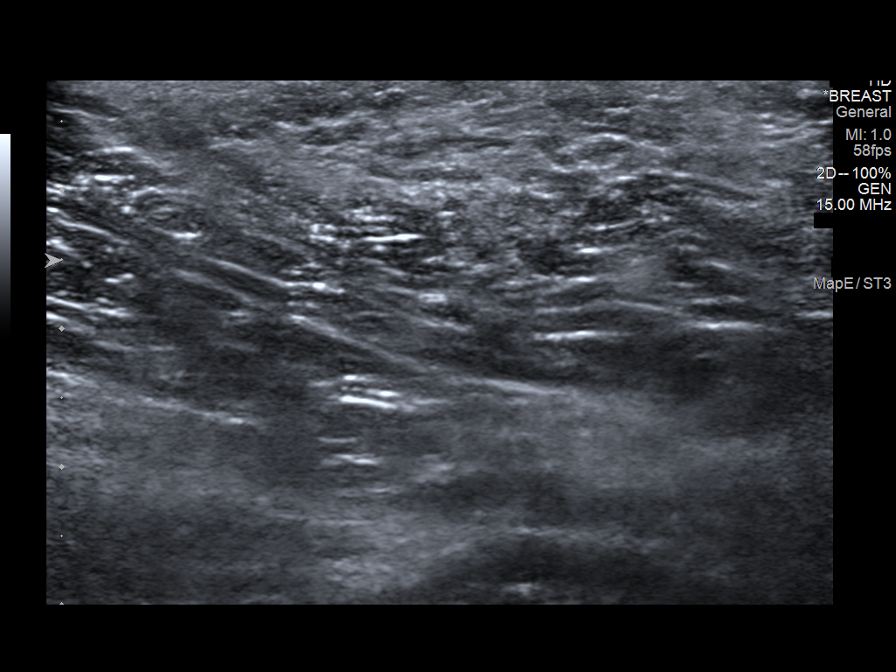
[im 6/8]
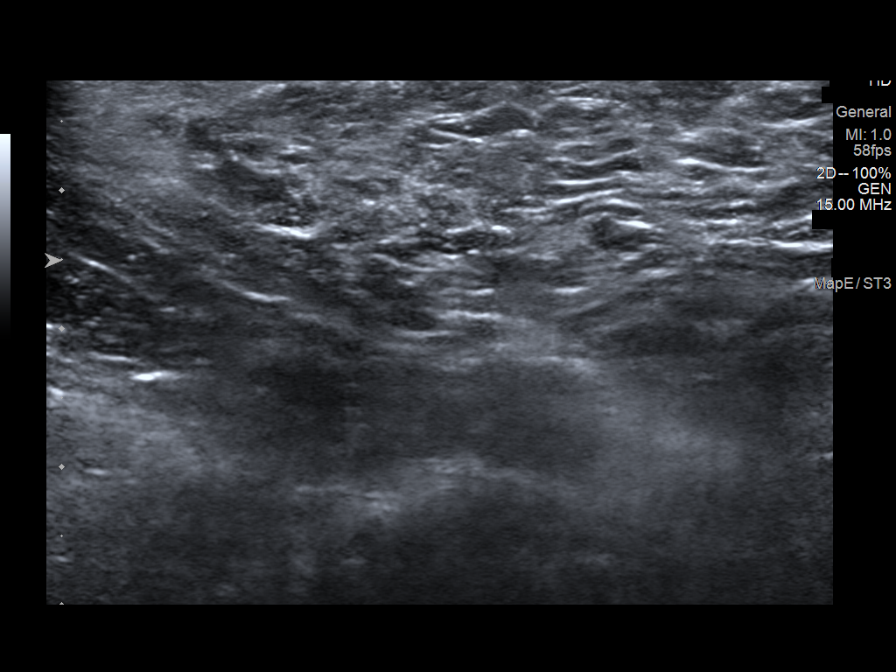
[im 7/8]
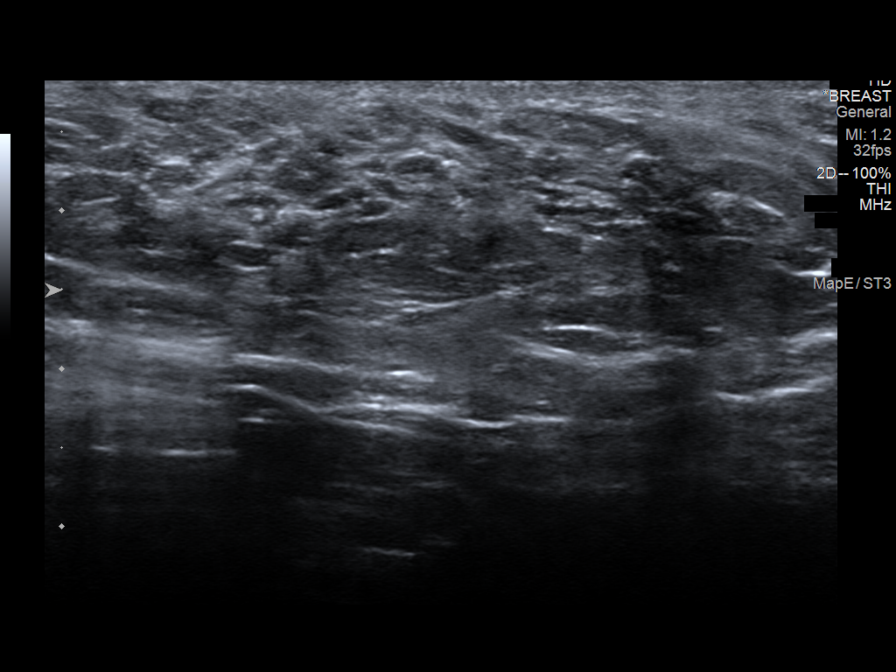
[im 8/8]
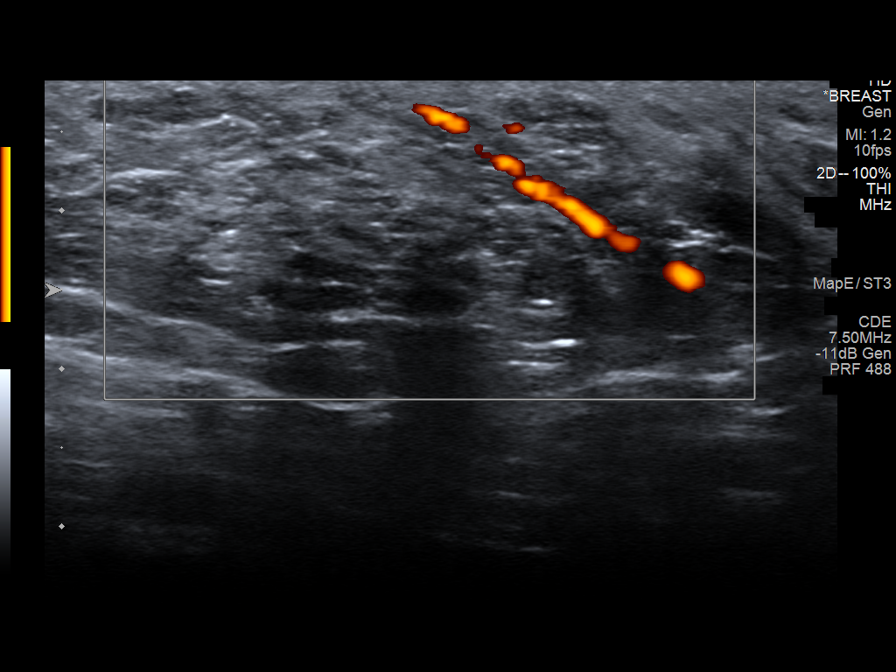

[8 of 8 positions shown; findings below may reference images not displayed]

FINDINGS: On physical exam, the patient has an approximately 5 cm rounded,
protuberant, compressible mass in the right axilla.

Targeted ultrasound is performed, showing normal appearing dense
fibroglandular tissue in the right axilla, corresponding to the
palpable mass. No abnormal lymph nodes were demonstrated.
IMPRESSION: 1. Benign right axillary accessory breast tissue.
2. No evidence of malignancy and no adenopathy.

RECOMMENDATION:
Annual screening mammography beginning at age 40.

I have discussed the findings and recommendations with the patient.
If applicable, a reminder letter will be sent to the patient
regarding the next appointment.

BI-RADS CATEGORY  2: Benign.
